# Patient Record
Sex: Female | Born: 1940 | Race: White | Hispanic: No | Marital: Married | Smoking: Never smoker
Health system: Southern US, Community
[De-identification: ages and names within clinical notes are randomized; demographics above are authoritative.]

## PROBLEM LIST (undated history)

## (undated) DIAGNOSIS — E785 Hyperlipidemia, unspecified: Secondary | ICD-10-CM

## (undated) DIAGNOSIS — H269 Unspecified cataract: Secondary | ICD-10-CM

## (undated) DIAGNOSIS — C4491 Basal cell carcinoma of skin, unspecified: Secondary | ICD-10-CM

## (undated) DIAGNOSIS — Z87442 Personal history of urinary calculi: Secondary | ICD-10-CM

## (undated) DIAGNOSIS — I10 Essential (primary) hypertension: Secondary | ICD-10-CM

## (undated) HISTORY — PX: COLONOSCOPY W/ BIOPSIES AND POLYPECTOMY: SHX1376

## (undated) HISTORY — DX: Personal history of urinary calculi: Z87.442

## (undated) HISTORY — PX: DILATION AND CURETTAGE OF UTERUS: SHX78

## (undated) HISTORY — DX: Unspecified cataract: H26.9

## (undated) HISTORY — DX: Essential (primary) hypertension: I10

## (undated) HISTORY — DX: Basal cell carcinoma of skin, unspecified: C44.91

## (undated) HISTORY — DX: Hyperlipidemia, unspecified: E78.5

## (undated) HISTORY — PX: MULTIPLE TOOTH EXTRACTIONS: SHX2053

## (undated) HISTORY — PX: PARATHYROIDECTOMY: SHX19

## (undated) HISTORY — PX: EYELID CARCINOMA EXCISION: SHX1563

## (undated) HISTORY — PX: TUBAL LIGATION: SHX77

---

## 1996-07-26 HISTORY — PX: ABDOMINAL HYSTERECTOMY: SHX81

## 1998-02-17 ENCOUNTER — Ambulatory Visit (HOSPITAL_COMMUNITY): Admission: RE | Admit: 1998-02-17 | Discharge: 1998-02-17 | Payer: Self-pay | Admitting: *Deleted

## 1998-09-29 ENCOUNTER — Ambulatory Visit (HOSPITAL_COMMUNITY): Admission: RE | Admit: 1998-09-29 | Discharge: 1998-09-29 | Payer: Self-pay | Admitting: Internal Medicine

## 1998-09-29 ENCOUNTER — Encounter: Payer: Self-pay | Admitting: Internal Medicine

## 1998-10-20 ENCOUNTER — Ambulatory Visit (HOSPITAL_COMMUNITY): Admission: RE | Admit: 1998-10-20 | Discharge: 1998-10-20 | Payer: Self-pay | Admitting: General Surgery

## 1998-10-20 ENCOUNTER — Encounter: Payer: Self-pay | Admitting: General Surgery

## 1999-01-12 ENCOUNTER — Other Ambulatory Visit: Admission: RE | Admit: 1999-01-12 | Discharge: 1999-01-12 | Payer: Self-pay | Admitting: *Deleted

## 1999-01-28 ENCOUNTER — Encounter: Payer: Self-pay | Admitting: *Deleted

## 1999-01-28 ENCOUNTER — Ambulatory Visit (HOSPITAL_COMMUNITY): Admission: RE | Admit: 1999-01-28 | Discharge: 1999-01-28 | Payer: Self-pay | Admitting: *Deleted

## 2000-01-29 ENCOUNTER — Ambulatory Visit (HOSPITAL_COMMUNITY): Admission: RE | Admit: 2000-01-29 | Discharge: 2000-01-29 | Payer: Self-pay | Admitting: *Deleted

## 2000-01-29 ENCOUNTER — Encounter: Payer: Self-pay | Admitting: *Deleted

## 2000-05-11 ENCOUNTER — Other Ambulatory Visit: Admission: RE | Admit: 2000-05-11 | Discharge: 2000-05-11 | Payer: Self-pay | Admitting: *Deleted

## 2000-06-02 ENCOUNTER — Encounter: Admission: RE | Admit: 2000-06-02 | Discharge: 2000-06-02 | Payer: Self-pay | Admitting: Specialist

## 2000-06-02 ENCOUNTER — Encounter: Payer: Self-pay | Admitting: Specialist

## 2001-01-30 ENCOUNTER — Ambulatory Visit (HOSPITAL_COMMUNITY): Admission: RE | Admit: 2001-01-30 | Discharge: 2001-01-30 | Payer: Self-pay | Admitting: *Deleted

## 2001-01-30 ENCOUNTER — Encounter: Payer: Self-pay | Admitting: *Deleted

## 2006-03-04 ENCOUNTER — Encounter: Admission: RE | Admit: 2006-03-04 | Discharge: 2006-03-04 | Payer: Self-pay | Admitting: Occupational Medicine

## 2007-05-29 ENCOUNTER — Ambulatory Visit: Payer: Self-pay | Admitting: Internal Medicine

## 2007-06-07 ENCOUNTER — Ambulatory Visit: Payer: Self-pay | Admitting: Internal Medicine

## 2010-09-30 ENCOUNTER — Encounter: Payer: Self-pay | Admitting: Family Medicine

## 2010-09-30 ENCOUNTER — Encounter (INDEPENDENT_AMBULATORY_CARE_PROVIDER_SITE_OTHER): Payer: MEDICARE | Admitting: Family Medicine

## 2010-09-30 DIAGNOSIS — E785 Hyperlipidemia, unspecified: Secondary | ICD-10-CM | POA: Insufficient documentation

## 2010-09-30 DIAGNOSIS — M899 Disorder of bone, unspecified: Secondary | ICD-10-CM

## 2010-09-30 DIAGNOSIS — I10 Essential (primary) hypertension: Secondary | ICD-10-CM | POA: Insufficient documentation

## 2010-10-06 NOTE — Assessment & Plan Note (Signed)
Summary: Medicare New Pt....   Vital Signs:  Patient profile:   70 year old female Height:      63 inches Weight:      156 pounds BMI:     27.73 Temp:     98.4 degrees F oral Pulse rate:   64 / minute Pulse rhythm:   regular BP sitting:   120 / 72  (left arm) Cuff size:   regular  Vitals Entered By: Linde Gillis CMA Duncan Dull) 10-29-10 10:46 AM) CC: new medicare physicial  Vision Screening:Both eyes w/o correction:  20/ 25 Left eye with correction: 20 / 25 Right eye with correction: 20 / 30  Color vision testing: normal      Vision Entered By: Linde Gillis CMA Duncan Dull) Oct 29, 2010 10:57 AM)  Hearing Screen  20db HL: Left  500 hz: 20db 1000 hz: 20db 2000 hz: 20db 4000 hz: 20db Right  500 hz: 20db 1000 hz: 20db 2000 hz: 20db 4000 hz: 20db   Hearing Testing Entered By: Linde Gillis CMA Duncan Dull) 2010/10/29 10:57 AM)   History of Present Illness: 70 yo here to establish care.  HTN- stable on current medications.  Has been on Triamtere/HCTZ 37/5/25 and Amlodipine 5 mg daily for years.  No CP, SOB, Le edema, blurred vision.  HLD- was told her lipid panel was elevated in June.  Lipitor caused muscle aches so she has been taking Red yeast rice which has not been causing myalgias.  Overall doing very well.  Stays active.  She is due for a DEXA scan.  Preventive Screening-Counseling & Management  Alcohol-Tobacco     Smoking Status: never  Caffeine-Diet-Exercise     Does Patient Exercise: yes      Drug Use:  no.    Current Medications (verified): 1)  Amlodipine Besylate 5 Mg Tabs (Amlodipine Besylate) .... Take One Tablet By Mouth Daily 2)  Triamterene-Hctz 37.5-25 Mg Tabs (Triamterene-Hctz) .... Take One Tablet By Mouth Daily 3)  Baby Aspirin 81 Mg Chew (Aspirin) .... Take One Tablet By Mouth Daily 4)  Lotrisone 1-0.05 % Crea (Clotrimazole-Betamethasone) .... Apply To Affected Area Two Times A Day 5)  Caltrate 600+d 600-400 Mg-Unit  Tabs (Calcium  Carbonate-Vitamin D) .... Take 1 Tablet By Mouth Two Times A Day  Allergies (verified): No Known Drug Allergies  Past History:  Family History: Last updated: 10/29/2010 Dad died of Colon CA at 36 yo Mom died of respiratory complications from PNA and TB at 69 yo  Social History: Last updated: 10/29/10 Married Never Smoked Alcohol use-no Drug use-no Regular exercise-yes  Risk Factors: Exercise: yes (10-29-2010)  Risk Factors: Smoking Status: never (2010/10/29)  Past Medical History: Hyperlipidemia Hypertension  Past Surgical History: Hysterectomy 1998- fibroids Parathyroidectomy 1990  Family History: Dad died of Colon CA at 52 yo Mom died of respiratory complications from PNA and TB at 69 yo  Social History: Married Never Smoked Alcohol use-no Drug use-no Regular exercise-yes Smoking Status:  never Drug Use:  no Does Patient Exercise:  yes  Review of Systems      See HPI General:  Denies malaise. Eyes:  Denies blurring. ENT:  Denies difficulty swallowing. CV:  Denies chest pain or discomfort. Resp:  Denies shortness of breath. GI:  Denies abdominal pain, bloody stools, and change in bowel habits. GU:  Denies dysuria. MS:  Denies joint pain, joint redness, and joint swelling. Derm:  Denies rash. Neuro:  Denies headaches. Psych:  Denies anxiety and depression.  Endo:  Denies cold intolerance and heat intolerance.  Physical Exam  General:  alert, well-developed, and well-nourished.   Head:  normocephalic and atraumatic.   Eyes:  vision grossly intact, pupils equal, pupils round, and pupils reactive to light.   Ears:  R ear normal and L ear normal.   Nose:  no external deformity.   Mouth:  good dentition.   Neck:  No deformities, masses, or tenderness noted. Lungs:  Normal respiratory effort, chest expands symmetrically. Lungs are clear to auscultation, no crackles or wheezes. Heart:  Normal rate and regular rhythm. S1 and S2 normal without gallop,  murmur, click, rub or other extra sounds. Abdomen:  Bowel sounds positive,abdomen soft and non-tender without masses, organomegaly or hernias noted. Msk:  No deformity or scoliosis noted of thoracic or lumbar spine.   Extremities:  no edema varicose veins bilaterally, nontender to palp Neurologic:  alert & oriented X3 and gait normal.   Skin:  Intact without suspicious lesions or rashes Psych:  Cognition and judgment appear intact. Alert and cooperative with normal attention span and concentration. No apparent delusions, illusions, hallucinations   Impression & Recommendations:  Problem # 1:  HYPERTENSION (ICD-401.9) Assessment Unchanged stable, continue current meds (awaiting records). Will check a BMET. Her updated medication list for this problem includes:    Amlodipine Besylate 5 Mg Tabs (Amlodipine besylate) .Marland Kitchen... Take one tablet by mouth daily    Triamterene-hctz 37.5-25 Mg Tabs (Triamterene-hctz) .Marland Kitchen... Take one tablet by mouth daily  Problem # 2:  HYPERLIPIDEMIA (ICD-272.4) Assessment: Unchanged Awaiting records, will order FLP, hepatic panel. Continue current dose of red yeast rice.  Problem # 3:  ? of OSTEOPENIA (ICD-733.90) Assessment: Unchanged Continue Caltrate, DEXA ordred today. Her updated medication list for this problem includes:    Caltrate 600+d 600-400 Mg-unit Tabs (Calcium carbonate-vitamin d) .Marland Kitchen... Take 1 tablet by mouth two times a day  Orders: Radiology Referral (Radiology)  Complete Medication List: 1)  Amlodipine Besylate 5 Mg Tabs (Amlodipine besylate) .... Take one tablet by mouth daily 2)  Triamterene-hctz 37.5-25 Mg Tabs (Triamterene-hctz) .... Take one tablet by mouth daily 3)  Baby Aspirin 81 Mg Chew (Aspirin) .... Take one tablet by mouth daily 4)  Lotrisone 1-0.05 % Crea (Clotrimazole-betamethasone) .... Apply to affected area two times a day 5)  Caltrate 600+d 600-400 Mg-unit Tabs (Calcium carbonate-vitamin d) .... Take 1 tablet by mouth two  times a day  Patient Instructions: 1)  Great to meet you. 2)  At your convenience, please make a fasting lab appointment- lipid panel, hepatic panel(272.4), BMET (401.9) 3)  Make an appointment in June for your medicare physical. 4)  Please stop by to see Shirlee Limerick on your way out to set up your bone density scan. Prescriptions: LOTRISONE 1-0.05 % CREA (CLOTRIMAZOLE-BETAMETHASONE) apply to affected area two times a day  #30 g x 1   Entered and Authorized by:   Ruthe Mannan MD   Signed by:   Ruthe Mannan MD on 09/30/2010   Method used:   Electronically to        [facility]  [address] (retail)       [address]       [place], [place]  [address]       Ph: [phone] or [phone]       Fax: [phone]   RxID:   [id number]    Orders Added: 1)  Radiology Referral [Radiology] 2)  New Patient Level III [56387]    Current Allergies (reviewed today): No  known allergies    Flex Sig Next Due:  Not Indicated Colonoscopy Result Date:  09/27/2006 Colonoscopy Result:  historical normal Hemoccult Result Date:  01/09/2010 Hemoccult Result:  historical normal PAP Next Due:  Not Indicated Mammogram Result Date:  05/08/2010 Mammogram Result:  historical-normal

## 2010-10-07 ENCOUNTER — Encounter (INDEPENDENT_AMBULATORY_CARE_PROVIDER_SITE_OTHER): Payer: Medicare Other

## 2010-10-07 ENCOUNTER — Encounter (INDEPENDENT_AMBULATORY_CARE_PROVIDER_SITE_OTHER): Payer: MEDICARE | Admitting: Vascular Surgery

## 2010-10-07 DIAGNOSIS — I83893 Varicose veins of bilateral lower extremities with other complications: Secondary | ICD-10-CM

## 2010-10-07 DIAGNOSIS — R609 Edema, unspecified: Secondary | ICD-10-CM

## 2010-10-08 NOTE — Consult Note (Signed)
NEW PATIENT CONSULTATION  Amy Barron, [name] DOB:  06-19-1941                                       10/07/2010 ZOXWR#:[id number]  Amy Barron presents today for evaluation of lower extremity pathology.  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  This is worse in her popliteal fossa and the thighs bilaterally.  She does have some of these scattered around her calves and also does have several isolated varicosities on her lateral calves bilaterally as well.  She does not have any history of DVT, superficial thrombophlebitis or bleeding.  PAST MEDICAL HISTORY:  Significant for hypertension and she does have history of prior hysterectomy and thyroidectomy.  SOCIAL HISTORY:  She is married with 2 children.  She does not smoke or drink alcohol.  REVIEW OF SYSTEMS:  Positive for weight loss and change in eyesight but has no change.  PHYSICAL EXAMINATION:  Well-developed, well-nourished white female appearing her stated age in no acute stress. Blood pressure is 122/76, pulse 72, respirations 16. HEENT:  Normal. Her radial and dorsalis pedis pulse are 2+ bilaterally. MUSCULOSKELETAL:  Shows no major deformity or cyanosis. NEUROLOGIC:  No focal weakness or paresthesias. SKIN:  Without ulcers or rashes.  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  Venous duplex was done in our office.  This reveals that her great saphenous vein __________ .  She does have some reticular branches of her left thigh but no evidence of significant valvular disease.  The deep veins showed no evidence of DVT and she has competent deep veins as well.  I discussed this at length with Ms.  Casimiro Needle and I explained that I do not see evidence of significant arterial or venous pathology that he would be dangerous.  I did explain the treatment options for the extensive telangiectasia that she has.  I explained that we will treat these with sclerotherapy.  Due to the extensive nature of these, I explained that it would require multiple sessions.  I explained the out of pocket expense related to this and she does have the contact information for Clementeen Hoof, RN for sclerotherapy in our office and I will see again at her convenience.    Larina Earthly, M.D. Electronically Signed  TFE/MEDQ  D:  10/07/2010  T:  10/08/2010  Job:  [id number]

## 2010-10-09 ENCOUNTER — Other Ambulatory Visit (INDEPENDENT_AMBULATORY_CARE_PROVIDER_SITE_OTHER): Payer: Medicare Other

## 2010-10-09 ENCOUNTER — Encounter (INDEPENDENT_AMBULATORY_CARE_PROVIDER_SITE_OTHER): Payer: Self-pay | Admitting: *Deleted

## 2010-10-09 ENCOUNTER — Other Ambulatory Visit: Payer: Self-pay | Admitting: Family Medicine

## 2010-10-09 DIAGNOSIS — I10 Essential (primary) hypertension: Secondary | ICD-10-CM

## 2010-10-09 DIAGNOSIS — E785 Hyperlipidemia, unspecified: Secondary | ICD-10-CM

## 2010-10-09 LAB — BASIC METABOLIC PANEL
BUN: 24 mg/dL — ABNORMAL HIGH (ref 6–23)
CO2: 28 mEq/L (ref 19–32)
Calcium: 9.5 mg/dL (ref 8.4–10.5)
Creatinine, Ser: 0.8 mg/dL (ref 0.4–1.2)
GFR: 71.57 mL/min (ref >60.00)
Glucose, Bld: 112 mg/dL — ABNORMAL HIGH (ref 70–99)

## 2010-10-09 LAB — HEPATIC FUNCTION PANEL
Albumin: 4.4 g/dL (ref 3.5–5.2)
Total Protein: 6.9 g/dL (ref 6.0–8.3)

## 2010-10-09 LAB — LIPID PANEL
Cholesterol: 234 mg/dL — ABNORMAL HIGH (ref 0–200)
HDL: 52 mg/dL (ref >39.00)
Total CHOL/HDL Ratio: 5
Triglycerides: 152 mg/dL — ABNORMAL HIGH (ref 0.0–149.0)
VLDL: 30.4 mg/dL (ref 0.0–40.0)

## 2010-10-09 LAB — LDL CHOLESTEROL, DIRECT: Direct LDL: 159.1 mg/dL

## 2010-10-13 NOTE — Procedures (Unsigned)
LOWER EXTREMITY VENOUS REFLUX EXAM  INDICATION:  Varicose veins with pain and swelling.  EXAM:  Using color-flow imaging and pulse Doppler spectral analysis, the bilateral common femoral, superficial femoral, popliteal, posterior tibial, greater and small saphenous veins are evaluated.  There is no evidence suggesting deep venous insufficiency in the bilateral lower extremities.  The right saphenofemoral junction is absent with previous surgical intervention.  The left saphenofemoral junction is competent.  The right GSV is competent.  The left GSV is competent at all segments except the knee level with the presence of a branch.  The bilateral proximal short saphenous vein demonstrates competency.  GSV Diameter (used if found to be incompetent only)                                           Right    Left Proximal Greater Saphenous Vein           cm       cm Proximal-to-mid-thigh                     cm       cm Mid thigh                                 cm       cm Mid-distal thigh                          cm       cm Distal thigh                              cm       cm Knee                                      cm       cm  IMPRESSION: 1. The right great saphenous vein is competent.  The left great     saphenous vein is incompetent at the knee segment with reflux >500     milliseconds and a measurement of 0.50 cm. 2. The right great saphenous vein is tortuous.  The left great     saphenous vein is not tortuous. 3. The deep venous system is competent. 4. The bilateral small saphenous vein is competent.  ___________________________________________ Larina Earthly, M.D.  SH/MEDQ  D:  10/07/2010  T:  10/07/2010  Job:  [id number]

## 2010-10-20 ENCOUNTER — Encounter: Payer: Self-pay | Admitting: Family Medicine

## 2010-11-09 ENCOUNTER — Encounter: Payer: Self-pay | Admitting: Family Medicine

## 2010-11-16 ENCOUNTER — Encounter: Payer: Self-pay | Admitting: Family Medicine

## 2010-11-18 ENCOUNTER — Encounter: Payer: Self-pay | Admitting: Family Medicine

## 2010-11-24 ENCOUNTER — Encounter: Payer: Self-pay | Admitting: Family Medicine

## 2010-11-25 ENCOUNTER — Encounter: Payer: Self-pay | Admitting: Family Medicine

## 2010-11-27 ENCOUNTER — Encounter: Payer: Self-pay | Admitting: Family Medicine

## 2011-01-25 ENCOUNTER — Other Ambulatory Visit: Payer: Self-pay | Admitting: Dermatology

## 2011-02-15 ENCOUNTER — Other Ambulatory Visit: Payer: Self-pay | Admitting: Family Medicine

## 2011-03-20 ENCOUNTER — Other Ambulatory Visit: Payer: Self-pay | Admitting: Family Medicine

## 2011-04-13 ENCOUNTER — Encounter: Payer: Self-pay | Admitting: *Deleted

## 2011-04-13 ENCOUNTER — Encounter: Payer: Self-pay | Admitting: Family Medicine

## 2011-04-16 ENCOUNTER — Other Ambulatory Visit (INDEPENDENT_AMBULATORY_CARE_PROVIDER_SITE_OTHER): Payer: Medicare Other

## 2011-04-16 ENCOUNTER — Other Ambulatory Visit: Payer: Self-pay | Admitting: Family Medicine

## 2011-04-16 DIAGNOSIS — I10 Essential (primary) hypertension: Secondary | ICD-10-CM

## 2011-04-16 DIAGNOSIS — E785 Hyperlipidemia, unspecified: Secondary | ICD-10-CM

## 2011-04-16 LAB — BASIC METABOLIC PANEL
CO2: 31 mEq/L (ref 19–32)
Chloride: 106 mEq/L (ref 96–112)
Creatinine, Ser: 0.7 mg/dL (ref 0.4–1.2)

## 2011-04-16 LAB — LIPID PANEL
LDL Cholesterol: 124 mg/dL — ABNORMAL HIGH (ref 0–99)
Total CHOL/HDL Ratio: 4
Triglycerides: 99 mg/dL (ref 0.0–149.0)

## 2011-04-20 ENCOUNTER — Other Ambulatory Visit: Payer: Self-pay | Admitting: Family Medicine

## 2011-04-20 ENCOUNTER — Ambulatory Visit (INDEPENDENT_AMBULATORY_CARE_PROVIDER_SITE_OTHER): Payer: Medicare Other | Admitting: Family Medicine

## 2011-04-20 ENCOUNTER — Encounter: Payer: Self-pay | Admitting: Family Medicine

## 2011-04-20 VITALS — BP 112/60 | HR 65 | Temp 98.2°F | Ht 64.0 in | Wt 161.5 lb

## 2011-04-20 DIAGNOSIS — Z23 Encounter for immunization: Secondary | ICD-10-CM

## 2011-04-20 DIAGNOSIS — I10 Essential (primary) hypertension: Secondary | ICD-10-CM

## 2011-04-20 DIAGNOSIS — E785 Hyperlipidemia, unspecified: Secondary | ICD-10-CM

## 2011-04-20 DIAGNOSIS — Z0000 Encounter for general adult medical examination without abnormal findings: Secondary | ICD-10-CM | POA: Insufficient documentation

## 2011-04-20 NOTE — Progress Notes (Signed)
I have personally reviewed the Medicare Annual Wellness questionnaire and have noted 1. The patient's medical and social history 2. Their use of alcohol, tobacco or illicit drugs 3. Their current medications and supplements 4. The patient's functional ability including ADL's, fall risks, home safety risks and hearing or visual             impairment. 5. Diet and physical activities 6. Evidence for depression or mood disorders   HTN- stable on current medications.  Has been on Triamtere/HCTZ 37/5/25 and Amlodipine 5 mg daily for years.  No CP, SOB, Le edema, blurred vision. Lab Results  Component Value Date   CREATININE 0.7 04/16/2011     HLD- Lipitor caused muscle aches so she has been taking Red yeast rice which has not been causing myalgias. Has also been exercising three times a week and cutting back on starches and fats. Lipids much improved! Lab Results  Component Value Date   CHOL 196 04/16/2011   CHOL 234* 10/09/2010   Lab Results  Component Value Date   HDL 52.20 04/16/2011   HDL 16.10 10/09/2010   Lab Results  Component Value Date   LDLCALC 124* 04/16/2011   Lab Results  Component Value Date   TRIG 99.0 04/16/2011   TRIG 152.0* 10/09/2010     Lab Results  Component Value Date   ALT 14 10/09/2010   AST 22 10/09/2010   ALKPHOS 68 10/09/2010   BILITOT 0.8 10/09/2010          Patient Active Problem List  Diagnoses  . HYPERLIPIDEMIA  . HYPERTENSION  . Routine general medical examination at a health care facility   Past Medical History  Diagnosis Date  . Hypertension   . Hyperlipidemia    Past Surgical History  Procedure Date  . Abdominal hysterectomy   . Parathyroidectomy    History  Substance Use Topics  . Smoking status: Never Smoker   . Smokeless tobacco: Not on file  . Alcohol Use: Not on file   Family History  Problem Relation Age of Onset  . Cancer Father    Allergies not on file Current Outpatient Prescriptions on File Prior to Visit    Medication Sig Dispense Refill  . amLODipine (NORVASC) 5 MG tablet TAKE ONE TABLET BY MOUTH DAILY AS DIRECTED  30 tablet  5  . etodolac (LODINE) 400 MG tablet TAKE 1 TABLET BY MOUTH ONCE A DAY AS DIRECTED  30 tablet  5   The PMH, PSH, Social History, Family History, Medications, and allergies have been reviewed in [facility], and have been updated if relevant.  Review of Systems       See HPI  Physical Exam BP 112/60  Pulse 65  Temp(Src) 98.2 F (36.8 C) (Oral)  Ht 5\' 4"  (1.626 m)  Wt 161 lb 8 oz (73.256 kg)  BMI 27.72 kg/m2  General:  alert, well-developed, and well-nourished.   Head:  normocephalic and atraumatic.   Eyes:  vision grossly intact, pupils equal, pupils round, and pupils reactive to light.   Ears:  R ear normal and L ear normal.   Nose:  no external deformity.   Mouth:  good dentition.   Neck:  No deformities, masses, or tenderness noted. Lungs:  Normal respiratory effort, chest expands symmetrically. Lungs are clear to auscultation, no crackles or wheezes. Heart:  Normal rate and regular rhythm. S1 and S2 normal without gallop, murmur, click, rub or other extra sounds. Abdomen:  Bowel sounds positive,abdomen soft and non-tender without masses,  organomegaly or hernias noted. Msk:  No deformity or scoliosis noted of thoracic or lumbar spine.   Extremities:  no edema varicose veins bilaterally, nontender to palp Neurologic:  alert & oriented X3 and gait normal.   Skin:  Intact without suspicious lesions or rashes Psych:  Cognition and judgment appear intact. Alert and cooperative with normal attention span and concentration. No apparent delusions, illusions, hallucinations  Assessment and Plan: 1. Routine general medical examination at a health care facility   The patients weight, height, BMI and visual acuity have been recorded in the chart I have made referrals, counseling and provided education to the patient based review of the above and I have provided the pt with  a written personalized care plan for preventive services. IFOB ordered today. Pneumovax given.  2. HYPERLIPIDEMIA   Improved with improved diet and exercise!   3. HYPERTENSION   Stable on current meds.

## 2011-04-20 NOTE — Patient Instructions (Signed)
Good to you. Have a wonderful holiday season. You can come back to get your flu shot or go to your local pharmacy.

## 2011-04-27 DIAGNOSIS — Z1211 Encounter for screening for malignant neoplasm of colon: Secondary | ICD-10-CM

## 2011-05-05 ENCOUNTER — Other Ambulatory Visit: Payer: Medicare Other

## 2011-05-05 ENCOUNTER — Encounter: Payer: Self-pay | Admitting: *Deleted

## 2011-05-05 ENCOUNTER — Other Ambulatory Visit: Payer: Self-pay | Admitting: Family Medicine

## 2011-08-17 ENCOUNTER — Other Ambulatory Visit: Payer: Self-pay | Admitting: *Deleted

## 2011-08-17 MED ORDER — AMLODIPINE BESYLATE 5 MG PO TABS: 5.0000 mg | ORAL_TABLET | Freq: Every day | ORAL | Status: DC

## 2011-09-20 ENCOUNTER — Other Ambulatory Visit: Payer: Self-pay | Admitting: Dermatology

## 2011-12-13 ENCOUNTER — Ambulatory Visit (INDEPENDENT_AMBULATORY_CARE_PROVIDER_SITE_OTHER)
Admission: RE | Admit: 2011-12-13 | Discharge: 2011-12-13 | Disposition: A | Discharge status: Home / Self Care | Payer: Medicare Other | Source: Ambulatory Visit | Attending: Family Medicine | Admitting: Family Medicine

## 2011-12-13 ENCOUNTER — Ambulatory Visit (INDEPENDENT_AMBULATORY_CARE_PROVIDER_SITE_OTHER): Payer: Medicare Other | Admitting: Family Medicine

## 2011-12-13 ENCOUNTER — Encounter: Payer: Self-pay | Admitting: Family Medicine

## 2011-12-13 VITALS — BP 110/80 | HR 76 | Temp 98.0°F | Wt 164.0 lb

## 2011-12-13 DIAGNOSIS — M545 Low back pain: Secondary | ICD-10-CM

## 2011-12-13 NOTE — Progress Notes (Signed)
SUBJECTIVE:  Amy Barron is a 71 y.o. female who complains of low back pain for 2 week(s), positional with bending or lifting, with radiation down the legs. Precipitating factors: none recalled by the patient. Prior history of back problems: recurrent self limited episodes of low back pain in the past. There is no numbness in the legs.  Patient Active Problem List  Diagnoses  . HYPERLIPIDEMIA  . HYPERTENSION  . Routine general medical examination at a health care facility  . Low back pain   Past Medical History  Diagnosis Date  . Hypertension   . Hyperlipidemia    Past Surgical History  Procedure Date  . Abdominal hysterectomy   . Parathyroidectomy    History  Substance Use Topics  . Smoking status: Never Smoker   . Smokeless tobacco: Not on file  . Alcohol Use: Not on file   Family History  Problem Relation Age of Onset  . Cancer Father    No Known Allergies Current Outpatient Prescriptions on File Prior to Visit  Medication Sig Dispense Refill  . amLODipine (NORVASC) 5 MG tablet Take 1 tablet (5 mg total) by mouth daily.  30 tablet  11  . aspirin 81 MG tablet Take 81 mg by mouth daily.        Marland Kitchen etodolac (LODINE) 400 MG tablet TAKE 1 TABLET BY MOUTH ONCE A DAY AS DIRECTED  30 tablet  5  . Red Yeast Rice 600 MG TABS Take 1 tablet by mouth 2 (two) times daily.        Marland Kitchen triamterene-hydrochlorothiazide (MAXZIDE-25) 37.5-25 MG per tablet TAKE 1 TABLET BY MOUTH ONCE A DAY  30 tablet  11   The PMH, PSH, Social History, Family History, Medications, and allergies have been reviewed in [facility], and have been updated if relevant.  OBJECTIVE: BP 110/80  Pulse 76  Temp(Src) 98 F (36.7 C) (Oral)  Wt 164 lb (74.39 kg)  Patient appears to be in mild to moderate pain, antalgic gait noted. Lumbosacral spine area reveals no local tenderness or mass.  Painful and reduced LS ROM noted. Straight leg raise is negative bilaterally DTR's, motor strength and sensation normal, including heel  and toe gait. Does have some tenderness of trochanteric bursa bilaterally. Peripheral pulses are palpable. X-Ray: ordered, but results not yet available.  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 Xray ordered given leg pain. For acute pain, rest, intermittent application of heat (do not sleep on heating pad), analgesics and muscle relaxants are recommended. Discussed longer term treatment plan of prn NSAID's and discussed a home back care exercise program with flexion exercise routine. Proper lifting with avoidance of heavy lifting discussed. Consider Physical Therapy and XRay studies if not improving. Call or return to clinic prn if these symptoms worsen or fail to improve as anticipated.

## 2011-12-13 NOTE — Patient Instructions (Signed)
Let's get an xray today. Aleve (1-2 tablets daily with food) and/or tylenol as needed for pain.  Subacromial injection may be beneficial to help with pain and to decrease inflammation.  Please don't take with your Lodine. Try applying heat and exercises.

## 2011-12-24 ENCOUNTER — Other Ambulatory Visit: Payer: Self-pay | Admitting: Family Medicine

## 2012-01-18 ENCOUNTER — Encounter (HOSPITAL_COMMUNITY): Payer: Self-pay | Admitting: Anesthesiology

## 2012-01-18 ENCOUNTER — Encounter (HOSPITAL_COMMUNITY)
Admission: AD | Disposition: A | Discharge status: Home / Self Care | Payer: Self-pay | Source: Ambulatory Visit | Attending: Orthopaedic Surgery

## 2012-01-18 ENCOUNTER — Other Ambulatory Visit (HOSPITAL_COMMUNITY): Payer: Self-pay | Admitting: Orthopaedic Surgery

## 2012-01-18 ENCOUNTER — Ambulatory Visit (HOSPITAL_COMMUNITY): Payer: Medicare Other

## 2012-01-18 ENCOUNTER — Ambulatory Visit (HOSPITAL_COMMUNITY): Payer: Medicare Other | Admitting: Anesthesiology

## 2012-01-18 ENCOUNTER — Emergency Department (HOSPITAL_COMMUNITY)
Admission: EM | Admit: 2012-01-18 | Discharge: 2012-01-18 | Disposition: A | Discharge status: Home / Self Care | Payer: Medicare Other | Source: Home / Self Care | Attending: Emergency Medicine | Admitting: Emergency Medicine

## 2012-01-18 ENCOUNTER — Encounter (HOSPITAL_COMMUNITY): Payer: Self-pay | Admitting: Emergency Medicine

## 2012-01-18 ENCOUNTER — Encounter (HOSPITAL_COMMUNITY): Payer: Self-pay | Admitting: Pharmacy Technician

## 2012-01-18 ENCOUNTER — Ambulatory Visit (HOSPITAL_COMMUNITY)
Admission: AD | Admit: 2012-01-18 | Discharge: 2012-01-19 | Disposition: A | Discharge status: Home / Self Care | Payer: Medicare Other | Source: Ambulatory Visit | Attending: Orthopaedic Surgery | Admitting: Orthopaedic Surgery

## 2012-01-18 ENCOUNTER — Encounter (HOSPITAL_COMMUNITY): Payer: Self-pay | Admitting: *Deleted

## 2012-01-18 ENCOUNTER — Emergency Department (HOSPITAL_COMMUNITY): Payer: Medicare Other

## 2012-01-18 DIAGNOSIS — S52502A Unspecified fracture of the lower end of left radius, initial encounter for closed fracture: Secondary | ICD-10-CM | POA: Diagnosis present

## 2012-01-18 DIAGNOSIS — S62109A Fracture of unspecified carpal bone, unspecified wrist, initial encounter for closed fracture: Secondary | ICD-10-CM

## 2012-01-18 DIAGNOSIS — E785 Hyperlipidemia, unspecified: Secondary | ICD-10-CM | POA: Insufficient documentation

## 2012-01-18 DIAGNOSIS — W07XXXA Fall from chair, initial encounter: Secondary | ICD-10-CM | POA: Insufficient documentation

## 2012-01-18 DIAGNOSIS — Y92009 Unspecified place in unspecified non-institutional (private) residence as the place of occurrence of the external cause: Secondary | ICD-10-CM | POA: Insufficient documentation

## 2012-01-18 DIAGNOSIS — S52509A Unspecified fracture of the lower end of unspecified radius, initial encounter for closed fracture: Secondary | ICD-10-CM

## 2012-01-18 DIAGNOSIS — I10 Essential (primary) hypertension: Secondary | ICD-10-CM | POA: Insufficient documentation

## 2012-01-18 DIAGNOSIS — S52599A Other fractures of lower end of unspecified radius, initial encounter for closed fracture: Secondary | ICD-10-CM | POA: Insufficient documentation

## 2012-01-18 HISTORY — PX: ORIF WRIST FRACTURE: SHX2133

## 2012-01-18 LAB — SURGICAL PCR SCREEN: MRSA, PCR: NEGATIVE

## 2012-01-18 LAB — URINALYSIS, ROUTINE W REFLEX MICROSCOPIC
Glucose, UA: NEGATIVE mg/dL
Hgb urine dipstick: NEGATIVE
Ketones, ur: 15 mg/dL — AB
Protein, ur: NEGATIVE mg/dL
Urobilinogen, UA: 0.2 mg/dL (ref 0.0–1.0)

## 2012-01-18 LAB — COMPREHENSIVE METABOLIC PANEL
ALT: 11 U/L (ref 0–35)
Alkaline Phosphatase: 76 U/L (ref 39–117)
BUN: 20 mg/dL (ref 6–23)
CO2: 26 mEq/L (ref 19–32)
Chloride: 101 mEq/L (ref 96–112)
GFR calc Af Amer: >90 mL/min (ref >90)
GFR calc non Af Amer: 86 mL/min — ABNORMAL LOW (ref >90)
Glucose, Bld: 120 mg/dL — ABNORMAL HIGH (ref 70–99)
Potassium: 4 mEq/L (ref 3.5–5.1)
Sodium: 139 mEq/L (ref 135–145)
Total Bilirubin: 0.4 mg/dL (ref 0.3–1.2)

## 2012-01-18 LAB — CBC
HCT: 40.7 % (ref 36.0–46.0)
Hemoglobin: 13.6 g/dL (ref 12.0–15.0)
MCHC: 33.4 g/dL (ref 30.0–36.0)
RBC: 4.35 MIL/uL (ref 3.87–5.11)

## 2012-01-18 LAB — PROTIME-INR: Prothrombin Time: 12.4 seconds (ref 11.6–15.2)

## 2012-01-18 LAB — APTT: aPTT: 33 seconds (ref 24–37)

## 2012-01-18 SURGERY — OPEN REDUCTION INTERNAL FIXATION (ORIF) WRIST FRACTURE
Anesthesia: General | Site: Arm Lower | Laterality: Left | Wound class: Clean

## 2012-01-18 MED ORDER — FENTANYL CITRATE 0.05 MG/ML IJ SOLN
INTRAMUSCULAR | Status: AC
  Filled 2012-01-18: qty 2

## 2012-01-18 MED ORDER — TRIAMTERENE-HCTZ 37.5-25 MG PO TABS
1.0000 | ORAL_TABLET | Freq: Every day | ORAL | Status: DC
  Administered 2012-01-19: 1 via ORAL
  Filled 2012-01-18: qty 1

## 2012-01-18 MED ORDER — 0.9 % SODIUM CHLORIDE (POUR BTL) OPTIME
TOPICAL | Status: DC | PRN
  Administered 2012-01-18: 1000 mL

## 2012-01-18 MED ORDER — ONDANSETRON HCL 4 MG/2ML IJ SOLN: 4.0000 mg | Freq: Once | INTRAMUSCULAR | Status: DC | PRN

## 2012-01-18 MED ORDER — CEFAZOLIN SODIUM 1-5 GM-% IV SOLN
INTRAVENOUS | Status: DC | PRN
  Administered 2012-01-18 (×2): 1 g via INTRAVENOUS

## 2012-01-18 MED ORDER — ASPIRIN 81 MG PO CHEW
81.0000 mg | CHEWABLE_TABLET | Freq: Every day | ORAL | Status: DC
  Administered 2012-01-19: 81 mg via ORAL
  Filled 2012-01-18: qty 1

## 2012-01-18 MED ORDER — AMLODIPINE BESYLATE 5 MG PO TABS
5.0000 mg | ORAL_TABLET | Freq: Every day | ORAL | Status: DC
  Administered 2012-01-19: 5 mg via ORAL
  Filled 2012-01-18: qty 1

## 2012-01-18 MED ORDER — CEFAZOLIN SODIUM 1-5 GM-% IV SOLN: 1.0000 g | INTRAVENOUS | Status: DC

## 2012-01-18 MED ORDER — FENTANYL CITRATE 0.05 MG/ML IJ SOLN
INTRAMUSCULAR | Status: DC | PRN
  Administered 2012-01-18: 50 ug via INTRAVENOUS
  Administered 2012-01-18: 75 ug via INTRAVENOUS

## 2012-01-18 MED ORDER — CEFAZOLIN SODIUM 1-5 GM-% IV SOLN
INTRAVENOUS | Status: AC
  Filled 2012-01-18: qty 50

## 2012-01-18 MED ORDER — MORPHINE SULFATE 2 MG/ML IJ SOLN
1.0000 mg | INTRAMUSCULAR | Status: DC | PRN
  Administered 2012-01-19: 1 mg via INTRAVENOUS
  Filled 2012-01-18 (×2): qty 1

## 2012-01-18 MED ORDER — MUPIROCIN 2 % EX OINT
TOPICAL_OINTMENT | CUTANEOUS | Status: AC
  Filled 2012-01-18: qty 22

## 2012-01-18 MED ORDER — HYDROCODONE-ACETAMINOPHEN 5-325 MG PO TABS: 2.0000 | ORAL_TABLET | ORAL | Status: AC | PRN

## 2012-01-18 MED ORDER — PROPOFOL 10 MG/ML IV BOLUS
INTRAVENOUS | Status: DC | PRN
  Administered 2012-01-18: 130 mg via INTRAVENOUS

## 2012-01-18 MED ORDER — MIDAZOLAM HCL 5 MG/5ML IJ SOLN
INTRAMUSCULAR | Status: DC | PRN
  Administered 2012-01-18: 2 mg via INTRAVENOUS

## 2012-01-18 MED ORDER — OXYCODONE-ACETAMINOPHEN 5-325 MG PO TABS
1.0000 | ORAL_TABLET | ORAL | Status: DC | PRN
  Administered 2012-01-19: 2 via ORAL
  Filled 2012-01-18: qty 2

## 2012-01-18 MED ORDER — RED YEAST RICE 600 MG PO TABS: 1.0000 | ORAL_TABLET | Freq: Every day | ORAL | Status: DC

## 2012-01-18 MED ORDER — OXYCODONE-ACETAMINOPHEN 5-325 MG PO TABS
2.0000 | ORAL_TABLET | ORAL | Status: DC | PRN
  Administered 2012-01-18 (×2): 1 via ORAL

## 2012-01-18 MED ORDER — OXYCODONE-ACETAMINOPHEN 5-325 MG PO TABS
ORAL_TABLET | ORAL | Status: AC
  Filled 2012-01-18: qty 1

## 2012-01-18 MED ORDER — HYDROMORPHONE HCL PF 1 MG/ML IJ SOLN: 0.2500 mg | INTRAMUSCULAR | Status: DC | PRN

## 2012-01-18 MED ORDER — OXYCODONE-ACETAMINOPHEN 5-325 MG PO TABS
ORAL_TABLET | ORAL | Status: AC
  Administered 2012-01-18: 1 via ORAL
  Filled 2012-01-18: qty 1

## 2012-01-18 MED ORDER — HYDROCODONE-ACETAMINOPHEN 5-325 MG PO TABS: 2.0000 | ORAL_TABLET | ORAL | Status: DC | PRN

## 2012-01-18 MED ORDER — ONDANSETRON HCL 4 MG/2ML IJ SOLN
INTRAMUSCULAR | Status: DC | PRN
  Administered 2012-01-18: 4 mg via INTRAVENOUS

## 2012-01-18 MED ORDER — OMEGA-3-ACID ETHYL ESTERS 1 G PO CAPS
1.0000 g | ORAL_CAPSULE | Freq: Every day | ORAL | Status: DC
  Administered 2012-01-19: 1 g via ORAL
  Filled 2012-01-18: qty 1

## 2012-01-18 MED ORDER — LACTATED RINGERS IV SOLN
INTRAVENOUS | Status: DC | PRN
  Administered 2012-01-18: 20:00:00 via INTRAVENOUS

## 2012-01-18 MED ORDER — MIDAZOLAM HCL 2 MG/2ML IJ SOLN
INTRAMUSCULAR | Status: AC
  Filled 2012-01-18: qty 2

## 2012-01-18 MED ORDER — ETODOLAC 400 MG PO TABS
400.0000 mg | ORAL_TABLET | Freq: Every day | ORAL | Status: DC | PRN
  Filled 2012-01-18: qty 1

## 2012-01-18 MED ORDER — MUPIROCIN 2 % EX OINT
TOPICAL_OINTMENT | Freq: Two times a day (BID) | CUTANEOUS | Status: DC
  Administered 2012-01-18: 15:00:00 via NASAL

## 2012-01-18 SURGICAL SUPPLY — 64 items
BANDAGE ELASTIC 3 VELCRO ST LF (GAUZE/BANDAGES/DRESSINGS) ×2 IMPLANT
BANDAGE ELASTIC 4 VELCRO ST LF (GAUZE/BANDAGES/DRESSINGS) ×2 IMPLANT
BANDAGE GAUZE ELAST BULKY 4 IN (GAUZE/BANDAGES/DRESSINGS) ×2 IMPLANT
BENZOIN TINCTURE PRP APPL 2/3 (GAUZE/BANDAGES/DRESSINGS) ×2 IMPLANT
BIT DRILL 2 FAST STEP (BIT) ×2 IMPLANT
BIT DRILL 2.5X4 QC (BIT) ×2 IMPLANT
BNDG ESMARK 4X9 LF (GAUZE/BANDAGES/DRESSINGS) ×2 IMPLANT
CLOTH BEACON ORANGE TIMEOUT ST (SAFETY) ×2 IMPLANT
CLSR STERI-STRIP ANTIMIC 1/2X4 (GAUZE/BANDAGES/DRESSINGS) ×2 IMPLANT
CORDS BIPOLAR (ELECTRODE) IMPLANT
COVER SURGICAL LIGHT HANDLE (MISCELLANEOUS) ×2 IMPLANT
CUFF TOURNIQUET SINGLE 18IN (TOURNIQUET CUFF) ×2 IMPLANT
CUFF TOURNIQUET SINGLE 24IN (TOURNIQUET CUFF) IMPLANT
DRAPE OEC MINIVIEW 54X84 (DRAPES) IMPLANT
DRAPE SURG 17X23 STRL (DRAPES) ×2 IMPLANT
DURAPREP 26ML APPLICATOR (WOUND CARE) ×2 IMPLANT
ELECT REM PT RETURN 9FT ADLT (ELECTROSURGICAL)
ELECTRODE REM PT RTRN 9FT ADLT (ELECTROSURGICAL) IMPLANT
GAUZE XEROFORM 1X8 LF (GAUZE/BANDAGES/DRESSINGS) ×2 IMPLANT
GLOVE BIO SURGEON STRL SZ7 (GLOVE) ×4 IMPLANT
GLOVE BIOGEL PI IND STRL 7.0 (GLOVE) ×1 IMPLANT
GLOVE BIOGEL PI IND STRL 8 (GLOVE) ×1 IMPLANT
GLOVE BIOGEL PI INDICATOR 7.0 (GLOVE) ×1
GLOVE BIOGEL PI INDICATOR 8 (GLOVE) ×1
GLOVE ORTHO TXT STRL SZ7.5 (GLOVE) ×2 IMPLANT
GOWN SRG XL XLNG 56XLVL 4 (GOWN DISPOSABLE) ×1 IMPLANT
GOWN STRL NON-REIN LRG LVL3 (GOWN DISPOSABLE) ×2 IMPLANT
GOWN STRL NON-REIN XL XLG LVL4 (GOWN DISPOSABLE) ×1
K-WIRE 1.6 (WIRE) ×1
K-WIRE FX5X1.6XNS BN SS (WIRE) ×1
KIT BASIN OR (CUSTOM PROCEDURE TRAY) ×2 IMPLANT
KIT ROOM TURNOVER OR (KITS) ×2 IMPLANT
KWIRE FX5X1.6XNS BN SS (WIRE) ×1 IMPLANT
MANIFOLD NEPTUNE II (INSTRUMENTS) ×2 IMPLANT
NEEDLE HYPO 25GX1X1/2 BEV (NEEDLE) IMPLANT
NEEDLE HYPO 25X1 1.5 SAFETY (NEEDLE) IMPLANT
NS IRRIG 1000ML POUR BTL (IV SOLUTION) ×2 IMPLANT
PACK ORTHO EXTREMITY (CUSTOM PROCEDURE TRAY) ×2 IMPLANT
PAD ARMBOARD 7.5X6 YLW CONV (MISCELLANEOUS) ×4 IMPLANT
PAD CAST 3X4 CTTN HI CHSV (CAST SUPPLIES) ×1 IMPLANT
PAD CAST 4YDX4 CTTN HI CHSV (CAST SUPPLIES) ×1 IMPLANT
PADDING CAST COTTON 3X4 STRL (CAST SUPPLIES) ×1
PADDING CAST COTTON 4X4 STRL (CAST SUPPLIES) ×1
PEG SUBCHONDRAL SMOOTH 2.0X16 (Peg) ×4 IMPLANT
PEG SUBCHONDRAL SMOOTH 2.0X18 (Peg) ×10 IMPLANT
PEG THREADED 2.5MMX20MM LONG (Peg) ×2 IMPLANT
PENCIL BUTTON HOLSTER BLD 10FT (ELECTRODE) IMPLANT
PLATE SHORT 21.6X48.9 NRRW LT (Plate) ×2 IMPLANT
SCREW BN 12X3.5XNS CORT TI (Screw) ×1 IMPLANT
SCREW CORT 3.5X12 (Screw) ×1 IMPLANT
SPLINT FIBERGLASS 4X30 (CAST SUPPLIES) ×2 IMPLANT
SPONGE GAUZE 4X4 12PLY (GAUZE/BANDAGES/DRESSINGS) ×2 IMPLANT
SPONGE LAP 4X18 X RAY DECT (DISPOSABLE) ×4 IMPLANT
SPONGE SCRUB IODOPHOR (GAUZE/BANDAGES/DRESSINGS) ×2 IMPLANT
SUCTION FRAZIER TIP 10 FR DISP (SUCTIONS) IMPLANT
SUT PROLENE 3 0 PS 2 (SUTURE) IMPLANT
SUT VIC AB 3-0 FS2 27 (SUTURE) IMPLANT
SUT VICRYL 4-0 PS2 18IN ABS (SUTURE) IMPLANT
SYR CONTROL 10ML LL (SYRINGE) IMPLANT
TOWEL OR 17X24 6PK STRL BLUE (TOWEL DISPOSABLE) ×2 IMPLANT
TOWEL OR 17X26 10 PK STRL BLUE (TOWEL DISPOSABLE) ×2 IMPLANT
TUBE CONNECTING 12X1/4 (SUCTIONS) IMPLANT
UNDERPAD 30X30 INCONTINENT (UNDERPADS AND DIAPERS) ×2 IMPLANT
WATER STERILE IRR 1000ML POUR (IV SOLUTION) ×2 IMPLANT

## 2012-01-18 NOTE — Progress Notes (Signed)
Report to Lucille Passy RN as primary caregiver.  Husband at bedside.

## 2012-01-18 NOTE — Discharge Instructions (Signed)
Cast or Splint Care Casts and splints support injured limbs and keep bones from moving while they heal.  HOME CARE  Keep the cast or splint uncovered during the drying period.   A plaster cast can take 24 to 48 hours to dry.   A fiberglass cast will dry in less than 1 hour.   Do not rest the cast on anything harder than a pillow for 24 hours.   Do not put weight on your injured limb. Do not put pressure on the cast. Wait for your doctor's approval.   Keep the cast or splint dry.   Cover the cast or splint with a plastic bag during baths or wet weather.   If you have a cast over your chest and belly (trunk), take sponge baths until the cast is taken off.   Keep your cast or splint clean. Wash a dirty cast with a damp cloth.   Do not put any objects under your cast or splint. Do not scratch the skin under the cast with an object.   Do not take out the padding from inside your cast.   Exercise your joints near the cast as told by your doctor.   Raise (elevate) your injured limb on 1 or 2 pillows for the first 1 to 3 days.  GET HELP RIGHT AWAY IF:  Your cast or splint cracks.   Your cast or splint is too tight or too loose.   You itch badly under the cast.   Your cast gets wet or has a soft spot.   You have a bad smell coming from the cast.   You get an object stuck under the cast.   Your skin around the cast becomes red or raw.   You have new or more pain after the cast is put on.   You have fluid leaking through the cast.   You cannot move your fingers or toes.   Your fingers or toes turn colors or are cool, painful, or puffy (swollen).   You have tingling or lose feeling (numbness) around the injured area.   You have pain or pressure under the cast.   You have trouble breathing or have shortness of breath.   You have chest pain.  MAKE SURE YOU:  Understand these instructions.   Will watch your condition.   Will get help right away if you are not doing  well or get worse.  Document Released: 11/11/2010 Document Revised: 07/01/2011 Document Reviewed: 11/11/2010 [facility] Patient Information 2012 [place], [place].Wrist Fracture Your caregiver has diagnosed you as having a fracture of the wrist. A fracture is a break in the bone or bones. A cast or splint is used to protect and keep your injured bone(s) from moving. The cast or splint will usually be on for about 5 to 6 weeks. One of the bones of the wrist (the navicular bone) often does not show up as a fracture on X-ray until later or in the healing phase. With this bone your caregiver will often cast as though it is fractured even if not seen on the X-ray. HOME CARE INSTRUCTIONS   To lessen the swelling, keep the injured part elevated while sitting or lying down. Keeping the injury above the level of your heart (the center of the chest) will decrease swelling and pain.   Do not wear rings or jewelry on the injured hand or wrist.   Apply ice to the injury for 15 to 20 minutes, 3 to 4 times  per day while awake for 2 days. Put the ice in a plastic bag and place a thin towel between the bag of ice and your cast.   If you have a plaster or fiberglass cast:   Do not try to scratch the skin under the cast using sharp or pointed objects.   Check the skin around the cast every day. You may put lotion on any red or sore areas.   Keep your cast dry and clean.   If you have a plaster splint:   Wear the splint as directed.   You may loosen the elastic around the splint if your fingers become numb, tingle, or turn cold or blue.   If you have been put in a removable splint, wear and use as directed.   Do not use powders or deodorants in or around the cast or splint.   Do not remove padding from your cast or splint.   Do not put pressure on any part of your cast or splint. It may break. Rest your cast or splint only on a pillow the first 24 hours until it is fully hardened.   Gently move your  fingers often, so they do not get stiff.   Do not remove the splint unless directed by your caregiver. Casts must be removed by an orthopedist.   Your cast or splint can be protected during bathing with a plastic bag. Do not lower the cast or splint into water.   Only take over-the-counter or prescription medicines for pain, discomfort, or fever as directed by your caregiver.   Follow up with your caregiver as directed.  SEEK IMMEDIATE MEDICAL CARE IF:   Your cast or splint gets damaged or breaks.   Your cast or splint feels too tight or loose.   You have increased pain, not controlled with medication.   You have increased swelling.   Your skin or nails below the injury turn blue or grey or feel cold or numb.   You have trouble moving or feeling your fingers.   You experience any burning or stinging from the cast or splint.   There is a bad smell coming from under the cast or splint.   New stains or fluids are coming from under the cast or splint.   You have any new injuries while wearing the cast or splint.  Document Released: 04/21/2005 Document Revised: 07/01/2011 Document Reviewed: 02/08/2007 [facility] Patient Information 2012 [place], [place].

## 2012-01-18 NOTE — Interval H&P Note (Signed)
History and Physical Interval Note:  01/18/2012 7:31 PM  Amy Barron  has presented today for surgery, with the diagnosis of distal raduis fracture  The various methods of treatment have been discussed with the patient and family. After consideration of risks, benefits and other options for treatment, the patient has consented to  Procedure(s) (LRB): OPEN REDUCTION INTERNAL FIXATION (ORIF) WRIST FRACTURE (Left) as a surgical intervention .  The patient's history has been reviewed, patient examined, no change in status, stable for surgery.  I have reviewed the patients' chart and labs.  Questions were answered to the patient's satisfaction.     Aureliano Oshields C

## 2012-01-18 NOTE — Preoperative (Signed)
Beta Blockers   Reason not to administer Beta Blockers:Not Applicable 

## 2012-01-18 NOTE — Transfer of Care (Signed)
Immediate Anesthesia Transfer of Care Note  Patient: Amy Barron  Procedure(s) Performed: Procedure(s) (LRB): OPEN REDUCTION INTERNAL FIXATION (ORIF) WRIST FRACTURE (Left)  Patient Location: PACU  Anesthesia Type: GA combined with regional for post-op pain  Level of Consciousness: awake, alert  and oriented  Airway & Oxygen Therapy: Patient connected to nasal cannula oxygen  Post-op Assessment: Report given to PACU RN, Post -op Vital signs reviewed and stable and Patient moving all extremities X 4  Post vital signs: Reviewed and stable  Complications: No apparent anesthesia complications

## 2012-01-18 NOTE — Progress Notes (Addendum)
PHARMACIST - PHYSICIAN ORDER COMMUNICATION  CONCERNING: P&T Medication Policy on Herbal Medications  DESCRIPTION:  This patient's order for:  RED YEAST RICE  has been noted.  This product(s) is classified as an "herbal" or natural product. Due to a lack of definitive safety studies or FDA approval, nonstandard manufacturing practices, plus the potential risk of unknown drug-drug interactions while on inpatient medications, the Pharmacy and Therapeutics Committee does not permit the use of "herbal" or natural products of this type within [facility].   ACTION TAKEN: The pharmacy department is unable to verify this order at this time and your patient has been informed of this safety policy. Please reevaluate patient's clinical condition at discharge and address if the herbal or natural product(s) should be resumed at that time.

## 2012-01-18 NOTE — Progress Notes (Signed)
Orthopedic Tech Progress Note Patient Details:  Amy Barron [birth year] [id number]  Patient ID: Amy Barron, female   DOB: 12/27/1940, 71 y.o.   MRN: [id number]   Amy Barron 01/18/2012, 12:02 PM Wrist splint

## 2012-01-18 NOTE — Progress Notes (Signed)
Patient already has Rx from office 6/25 for percocet and has gotten it filled for post op pain after discharge in AM

## 2012-01-18 NOTE — H&P (Signed)
Amy Barron is an 71 y.o. female.   Chief Complaint: left distal radius fracture.  HPI: 71 yo female fell while cleaning window standing on a chair.  Sent from ER Dr Radford Pax to see me for surgical treatment.   Left calf contusion.  Meds amlodipine 5mg  po daily, ASA 81 mg po daily, triampterene/HCTZ 37.5/25mg  po daily  And red yeast  Past Medical History  Diagnosis Date  . Hypertension   . Hyperlipidemia     Past Surgical History  Procedure Date  . Abdominal hysterectomy   . Parathyroidectomy     Family History  Problem Relation Age of Onset  . Cancer Father    Social History:  reports that she has never smoked. She does not have any smokeless tobacco history on file. She reports that she does not drink alcohol or use illicit drugs.  Allergies: No Known Allergies  Medications Prior to Admission  Medication Sig Dispense Refill  . amLODipine (NORVASC) 5 MG tablet Take 5 mg by mouth daily.      Marland Kitchen aspirin 81 MG tablet Take 81 mg by mouth daily.        Marland Kitchen etodolac (LODINE) 400 MG tablet Take 400 mg by mouth daily as needed. For arthritis      . HYDROcodone-acetaminophen (NORCO) 5-325 MG per tablet Take 2 tablets by mouth every 4 (four) hours as needed for pain.  6 tablet  0  . Red Yeast Rice 600 MG TABS Take 1 tablet by mouth daily.       Marland Kitchen triamterene-hydrochlorothiazide (MAXZIDE-25) 37.5-25 MG per tablet Take 1 tablet by mouth daily.        No results found for this or any previous visit (from the past 48 hour(s)). Dg Wrist Complete Left  01/18/2012  *RADIOLOGY REPORT*  Clinical Data: Fall, wrist pain/swelling  LEFT WRIST - COMPLETE 3+ VIEW  Comparison: None.  Findings: Comminuted distal radial fracture with apex volar angulation.  Intra-articular extension is suspected.  Mildly displaced ulnar styloid fracture.  Associated moderate soft tissue swelling.  IMPRESSION: Distal radial and ulnar fractures, as described above.  Original Report Authenticated By: Charline Bills, M.D.      Review of Systems  Constitutional: Negative.   HENT: Negative.        Thyroid condition  Eyes: Negative.   Respiratory: Negative.   Cardiovascular: Negative.   Gastrointestinal: Negative.   Genitourinary: Negative.   Musculoskeletal: Negative.   Skin: Negative.   Neurological: Negative.   Psychiatric/Behavioral: Negative.     There were no vitals taken for this visit. Physical Exam  Constitutional: She is oriented to person, place, and time. She appears well-developed and well-nourished.  HENT:  Head: Normocephalic and atraumatic.  Eyes: Conjunctivae are normal. Pupils are equal, round, and reactive to light.  Neck: Normal range of motion.  Cardiovascular: Normal rate, regular rhythm, normal heart sounds and intact distal pulses.   Respiratory: Effort normal and breath sounds normal.  GI: Soft. Bowel sounds are normal.  Musculoskeletal:       Left distal radius deformity.  Intact 2 point all UE digits. Pulses normal  Neurological: She is alert and oriented to person, place, and time. She has normal reflexes.  Skin: Skin is warm and dry.  Psychiatric: She has a normal mood and affect. Her behavior is normal. Thought content normal.     Assessment/Plan Left comminuted, displaced intra-articular fracture with involvment of of distal RU joint and wrist joint with angulation.   Procedure discussed, risks of bleeding  infection reoperation, nerve or artery damage, all questions answered . She requests we proceed.   NPO since 6:30 AM  Earnest Thalman C 01/18/2012, 2:01 PM

## 2012-01-18 NOTE — Anesthesia Preprocedure Evaluation (Addendum)
Anesthesia Evaluation  Patient identified by MRN, date of birth, ID band Patient awake    Reviewed: Allergy & Precautions, H&P , NPO status   History of Anesthesia Complications Negative for: history of anesthetic complications  Airway Mallampati: II      Dental  (+) Partial Upper   Pulmonary neg pulmonary ROS,  breath sounds clear to auscultation        Cardiovascular Exercise Tolerance: Good hypertension, Pt. on medications Rhythm:Regular Rate:Normal     Neuro/Psych negative neurological ROS  negative psych ROS   GI/Hepatic   Endo/Other  S/p parathyroidectomy  Renal/GU      Musculoskeletal   Abdominal   Peds  Hematology   Anesthesia Other Findings   Reproductive/Obstetrics                          Anesthesia Physical Anesthesia Plan  ASA: II  Anesthesia Plan: General   Post-op Pain Management:    Induction: Intravenous  Airway Management Planned: LMA  Additional Equipment:   Intra-op Plan:   Post-operative Plan:   Informed Consent: I have reviewed the patients History and Physical, chart, labs and discussed the procedure including the risks, benefits and alternatives for the proposed anesthesia with the patient or authorized representative who has indicated his/her understanding and acceptance.   Dental advisory given  Plan Discussed with: CRNA and Anesthesiologist  Anesthesia Plan Comments: (Comminuted L. Distal radius fracture htn   Plan GA with supraclavicular block  Kipp Brood, MD )       Anesthesia Quick Evaluation

## 2012-01-18 NOTE — Anesthesia Procedure Notes (Addendum)
Procedure Name: LMA Insertion Date/Time: 01/18/2012 8:23 PM Performed by: Molli Hazard Pre-anesthesia Checklist: Patient identified, Emergency Drugs available, Suction available and Patient being monitored Patient Re-evaluated:Patient Re-evaluated prior to inductionOxygen Delivery Method: Circle system utilized Preoxygenation: Pre-oxygenation with 100% oxygen Intubation Type: IV induction Ventilation: Mask ventilation without difficulty LMA: LMA inserted LMA Size: 4.0 Number of attempts: 1 Placement Confirmation: positive ETCO2 and breath sounds checked- equal and bilateral Dental Injury: Teeth and Oropharynx as per pre-operative assessment    Anesthesia Regional Block:    Pre-Anesthetic Checklist: ,, timeout performed, Correct Patient, Correct Site, Correct Laterality, Correct Procedure, Correct Position, site marked, Risks and benefits discussed,  Surgical consent,  Pre-op evaluation,  At surgeon's request and post-op pain management  Laterality: Left  Prep: chloraprep       Needles:  Injection technique: Single-shot  Needle Type: Echogenic Stimulator Needle     Needle Length:cm 9 cm Needle Gauge: 22 and 22 G    Additional Needles:  Procedures: ultrasound guided and nerve stimulator  Narrative:  Start time: 01/18/2012 7:30 PM End time: 01/18/2012 7:40 PM  Performed by: Personally   Additional Notes: 25 cc 0.5% Marcaine with 1;200 Epi Kipp Brood, MD

## 2012-01-18 NOTE — ED Notes (Signed)
Pt. Stated, i fell off a chair when I was cleaning windows and feel with my lt. Wrist.

## 2012-01-18 NOTE — ED Provider Notes (Signed)
History   This chart was scribed for Amy Shi, MD by Amy Barron. The patient was seen in room TR05C/TR05C and the patient's care was started at 11:09 AM     CSN: [id number]  Arrival date & time 01/18/12  1040   None     Chief Complaint  Patient presents with  . Wrist Pain    (Consider location/radiation/quality/duration/timing/severity/associated sxs/prior treatment) Patient is a 71 y.o. female presenting with wrist pain. The history is provided by the patient. No language interpreter was used.  Wrist Pain This is a new problem. The current episode started 3 to 5 hours ago. The problem occurs constantly. The problem has not changed since onset.Pertinent negatives include no chest pain and no headaches. The symptoms are aggravated by bending and twisting. The symptoms are relieved by ice. She has tried a cold compress for the symptoms. The treatment provided moderate relief.   Amy Barron is a 71 y.o. female who presents to the Emergency Department complaining of moderate, episodic wrist pain located at the left wrist onset today. The pt was cleaning her windows when she fell and landed on her left wrist. Modifying factors include application of ice which provides moderate relief. Pt has a hx of hypertension.   Pt denies any other injuries.   PCP is Dr. Dayton Martes.     Past Medical History  Diagnosis Date  . Hypertension   . Hyperlipidemia     Past Surgical History  Procedure Date  . Abdominal hysterectomy   . Parathyroidectomy   . Orif wrist fracture 01/18/2012    Procedure: OPEN REDUCTION INTERNAL FIXATION (ORIF) WRIST FRACTURE;  Surgeon: Eldred Manges, MD;  Location: [facility] OR;  Service: Orthopedics;  Laterality: Left;    Family History  Problem Relation Age of Onset  . Cancer Father     History  Substance Use Topics  . Smoking status: Never Smoker   . Smokeless tobacco: Not on file  . Alcohol Use: No    OB History    Grav Para Term Preterm Abortions TAB SAB  Ect Mult Living                  Review of Systems  Cardiovascular: Negative for chest pain.  Neurological: Negative for headaches.  All other systems reviewed and are negative.    Allergies  Review of patient's allergies indicates no known allergies.  Home Medications   Current Outpatient Rx  Name Route Sig Dispense Refill  . AMLODIPINE BESYLATE 5 MG PO TABS Oral Take 5 mg by mouth daily.    . ASPIRIN 81 MG PO TABS Oral Take 81 mg by mouth daily.      . ETODOLAC 400 MG PO TABS Oral Take 400 mg by mouth daily as needed. For arthritis    . RED YEAST RICE 600 MG PO TABS Oral Take 1 tablet by mouth daily.     . TRIAMTERENE-HCTZ 37.5-25 MG PO TABS Oral Take 1 tablet by mouth daily.    Marland Kitchen CLOTRIMAZOLE-BETAMETHASONE 1-0.05 % EX CREA  APPLY TO AFFECTED AREA TWICE DAILY 30 g 0  . NAPROXEN SODIUM 220 MG PO TABS Oral Take 220 mg by mouth daily as needed.    Marland Kitchen FISH OIL PO Oral Take 1 capsule by mouth once a week.      BP 160/68  Pulse 72  Temp 98.3 F (36.8 C) (Oral)  Resp 16  SpO2 100%  Physical Exam  Nursing note and vitals reviewed. Constitutional: She is  oriented to person, place, and time. She appears well-developed and well-nourished. No distress.  HENT:  Head: Normocephalic and atraumatic.  Eyes: Pupils are equal, round, and reactive to light.  Neck: Normal range of motion.  Cardiovascular: Normal rate and intact distal pulses.   Pulmonary/Chest: No respiratory distress.  Abdominal: Normal appearance. She exhibits no distension.  Musculoskeletal:        Obvious deformity, neurovascularly intact, decreased ROM, bony tenderness to palpitation.   Neurological: She is alert and oriented to person, place, and time. No cranial nerve deficit.  Skin: Skin is warm and dry. No rash noted.  Psychiatric: She has a normal mood and affect. Her behavior is normal.    ED Course  Procedures (including critical care time) Discussed with Dr. Ophelia Charter DIAGNOSTIC STUDIES: Oxygen  Saturation is 100% on room air, normal by my interpretation.    COORDINATION OF CARE:  11:10AM    Labs Reviewed - No data to display Dg Wrist Complete Left  01/18/2012  *RADIOLOGY REPORT*  Clinical Data: Fall, wrist pain/swelling  LEFT WRIST - COMPLETE 3+ VIEW  Comparison: None.  Findings: Comminuted distal radial fracture with apex volar angulation.  Intra-articular extension is suspected.  Mildly displaced ulnar styloid fracture.  Associated moderate soft tissue swelling.  IMPRESSION: Distal radial and ulnar fractures, as described above.  Original Report Authenticated By: Charline Bills, M.D.      1. Wrist fracture       MDM         I personally performed the services described in this documentation, which was scribed in my presence. The recorded information has been reviewed and considered.    Amy Shi, MD 02/06/12 [phone]

## 2012-01-18 NOTE — Brief Op Note (Signed)
01/18/2012  9:32 PM  PATIENT:  Amy Barron  71 y.o. female  PRE-OPERATIVE DIAGNOSIS:  distal raduis fracture left arm  POST-OPERATIVE DIAGNOSIS:  distal raduis fracture left arm  PROCEDURE:  Procedure(s) (LRB): OPEN REDUCTION INTERNAL FIXATION (ORIF) WRIST FRACTURE (Left)  SURGEON:  Surgeon(s) and Role:    * Eldred Manges, MD - Primary  PHYSICIAN ASSISTANT:   ASSISTANTS: none   ANESTHESIA:   regional and general  EBL:  Total I/O In: 700 [I.V.:700] Out: -   BLOOD ADMINISTERED:none  DRAINS: none   LOCAL MEDICATIONS USED:  NONE  SPECIMEN:  No Specimen  DISPOSITION OF SPECIMEN:  N/A  COUNTS:  YES  TOURNIQUET:   Total Tourniquet Time Documented: Upper Arm (Left) - 31 minutes  DICTATION: .Other Dictation: Dictation Number [id number]  PLAN OF CARE: Admit to inpatient   PATIENT DISPOSITION:  PACU - hemodynamically stable.   Delay start of Pharmacological VTE agent (>24hrs) due to surgical blood loss or risk of bleeding: not applicable

## 2012-01-18 NOTE — Anesthesia Postprocedure Evaluation (Signed)
  Anesthesia Post-op Note  Patient: Amy Barron  Procedure(s) Performed: Procedure(s) (LRB): OPEN REDUCTION INTERNAL FIXATION (ORIF) WRIST FRACTURE (Left)  Patient Location: PACU  Anesthesia Type: General and GA combined with regional for post-op pain  Level of Consciousness: awake, alert  and oriented  Airway and Oxygen Therapy: Patient Spontanous Breathing and Patient connected to nasal cannula oxygen  Post-op Pain: none  Post-op Assessment: Post-op Vital signs reviewed and Patient's Cardiovascular Status Stable  Post-op Vital Signs: stable  Complications: No apparent anesthesia complications

## 2012-01-18 NOTE — ED Notes (Signed)
Ice applied to wrist.

## 2012-01-19 ENCOUNTER — Encounter (HOSPITAL_COMMUNITY): Payer: Self-pay | Admitting: *Deleted

## 2012-01-19 DIAGNOSIS — S52502A Unspecified fracture of the lower end of left radius, initial encounter for closed fracture: Secondary | ICD-10-CM | POA: Diagnosis present

## 2012-01-19 NOTE — Progress Notes (Signed)
Subjective: 1 Day Post-Op Procedure(s) (LRB): OPEN REDUCTION INTERNAL FIXATION (ORIF) WRIST FRACTURE (Left) Patient reports pain as mild.    Objective: Vital signs in last 24 hours: Temp:  [96.8 F (36 C)-99.2 F (37.3 C)] 98.2 F (36.8 C) (06/26 0551) Pulse Rate:  [59-82] 78  (06/26 0551) Resp:  [11-20] 16  (06/26 0551) BP: (121-160)/(56-87) 121/61 mmHg (06/26 0551) SpO2:  [94 %-100 %] 95 % (06/26 0551)  Intake/Output from previous day: 06/25 0701 - 06/26 0700 In: 700 [I.V.:700] Out: 700 [Urine:700] Intake/Output this shift:     Basename 01/18/12 1444  HGB 13.6    Basename 01/18/12 1444  WBC 11.5*  RBC 4.35  HCT 40.7  PLT 249    Basename 01/18/12 1444  NA 139  K 4.0  CL 101  CO2 26  BUN 20  CREATININE 0.71  GLUCOSE 120*  CALCIUM 10.1    Basename 01/18/12 1444  LABPT --  INR 0.91    Neurovascular intact Sensation intact distally  Assessment/Plan: 1 Day Post-Op Procedure(s) (LRB): OPEN REDUCTION INTERNAL FIXATION (ORIF) WRIST FRACTURE (Left) Advance diet Discharge home today Elevation of hand nonweight bearing of left upper extremity OV 1 week Keep splint dry and clean Has rx for percocet COD stable  Dejohn Ibarra M 01/19/2012, 8:21 AM

## 2012-01-19 NOTE — Progress Notes (Signed)
Patient ID: Amy Barron, female   DOB: 04-11-1941, 71 y.o.   MRN: [id number] Pt. Discharged 01/19/2012  8:38 AM Discharge instructions reviewed with patient/family. Patient/family verbalized understanding. All Rx's given. Questions answered as needed. Pt. Discharged to home with family/self.  Lurline Idol [facility]

## 2012-01-19 NOTE — Op Note (Signed)
NAMECRISTABEL, Amy Barron               ACCOUNT NO.:  [id number]  MEDICAL RECORD NO.:  [id number]  LOCATION:  5158                         FACILITY:  [facility]  PHYSICIAN:  Cydne Grahn C. Ophelia Charter, M.D.    DATE OF BIRTH:  09-24-1940  DATE OF PROCEDURE:  01/18/2012 DATE OF DISCHARGE:                              OPERATIVE REPORT   PREOPERATIVE DIAGNOSIS:  Left distal radius fracture.  POSTOPERATIVE DIAGNOSIS:  Left distal radius fracture.  PROCEDURE:  Open reduction and internal fixation of left distal radius with hand innovation, volar plating.  SURGEON:  Liisa Picone C. Ophelia Charter, MD  ANESTHESIA:  GOT.  TOURNIQUET TIME:  35 minutes.  COMPLICATIONS:  None.  PROCEDURE:  After induction of preoperative scalene block anesthesia followed by prepping and draping.  As prepping was performed and her wrist point was removed, she had some increased pain, feeling and general anesthesia was given.  There was partial block.  Preoperative Ancef was given prophylactically, 1 g initially and then a second dose was given once she was back on the operating room.  Time-out procedure was completed.  Sterile skin marker was used for planned volar approach over the FCR.  Incision was made.  Sheath over the FCR was split, posterior sheath was split and then the pronator was pulled off from the radial aspect of the radius toward the ulna and fracture was reduced, held with the pin through the styloid.  Bone was relatively soft.  There was significant shortening and pin was necessary to hold the fracture out to length, and hole was drilled and the plate was slid and adjusted until was in appropriate distance alignment.  Distal holes were drilled first starting on the ulnar aspect distal row, then proximal row. Sixteen smooth pegs were used x2.  The rest were all 18 smooth pegs except for 20-mm styloid partially-threaded screw.  Fluoroscopic pictures were used to confirm excellent position and alignment.  There was good  reduction.  A second 12-mm proximal screw was placed in the proximal end of the plate.  The tourniquet was deflated.  Hemostasis was obtained.  Wound was irrigated and standard closure with 0 Vicryl in the subcutaneous tissue, 4-0 Vicryl in the subcuticular closure.  Tincture of benzoin, Steri-Strips, twiners, 4x4s and soft dressing with volar splint applied, fiberglass prefab and then Ace wraps.  Instrument count and needle count were correct.  The patient was transferred to the recovery room in stable condition.     Raydan Schlabach C. Ophelia Charter, M.D.     MCY/MEDQ  D:  01/18/2012  T:  01/19/2012  Job:  [id number]

## 2012-01-19 NOTE — Discharge Summary (Signed)
Physician Discharge Summary  Patient ID: Amy Barron MRN: [id number] DOB/AGE: [birth year] 71 y.o.  Admit date: 01/18/2012 Discharge date: 01/19/2012  Admission Diagnoses:  Distal radius fracture, left  Discharge Diagnoses:  Principal Problem:  *Distal radius fracture, left   Past Medical History  Diagnosis Date  . Hypertension   . Hyperlipidemia     Surgeries: Procedure(s): OPEN REDUCTION INTERNAL FIXATION (ORIF) WRIST FRACTURE on 01/18/2012   Consultants (if any):  none  Discharged Condition: Improved  Hospital Course: Amy Barron is an 71 y.o. female who was admitted 01/18/2012 with a diagnosis of Distal radius fracture, left and went to the operating room on 01/18/2012 and underwent the above named procedures.    She was given perioperative antibiotics:  Anti-infectives     Start     Dose/Rate Route Frequency Ordered Stop   01/18/12 1508   ceFAZolin (ANCEF) 1-5 GM-% IVPB     Comments: FAY, KATHY: cabinet override         01/18/12 1508 01/19/12 0314   01/18/12 1445   ceFAZolin (ANCEF) IVPB 1 g/50 mL premix  Status:  Discontinued        1 g 100 mL/hr over 30 Minutes Intravenous 60 min pre-op 01/18/12 1445 01/18/12 2341        .  She was given sequential compression devices, early ambulation.  She benefited maximally from the hospital stay and there were no complications.    Recent vital signs:  Filed Vitals:   01/19/12 0551  BP: 121/61  Pulse: 78  Temp: 98.2 F (36.8 C)  Resp: 16    Recent laboratory studies:  Lab Results  Component Value Date   HGB 13.6 01/18/2012   Lab Results  Component Value Date   WBC 11.5* 01/18/2012   PLT 249 01/18/2012   Lab Results  Component Value Date   INR 0.91 01/18/2012   Lab Results  Component Value Date   NA 139 01/18/2012   K 4.0 01/18/2012   CL 101 01/18/2012   CO2 26 01/18/2012   BUN 20 01/18/2012   CREATININE 0.71 01/18/2012   GLUCOSE 120* 01/18/2012    Discharge Medications:   Medication List  As of  01/19/2012  3:04 PM   TAKE these medications         amLODipine 5 MG tablet   Commonly known as: NORVASC   Take 5 mg by mouth daily.      aspirin 81 MG tablet   Take 81 mg by mouth daily.      etodolac 400 MG tablet   Commonly known as: LODINE   Take 400 mg by mouth daily as needed. For arthritis      FISH OIL PO   Take 1 capsule by mouth once a week.      HYDROcodone-acetaminophen 5-325 MG per tablet   Commonly known as: NORCO   Take 2 tablets by mouth every 4 (four) hours as needed for pain.      naproxen sodium 220 MG tablet   Commonly known as: ANAPROX   Take 220 mg by mouth daily as needed.      Red Yeast Rice 600 MG Tabs   Take 1 tablet by mouth daily.      triamterene-hydrochlorothiazide 37.5-25 MG per tablet   Commonly known as: MAXZIDE-25   Take 1 tablet by mouth daily.            Diagnostic Studies: Dg Chest 2 View  01/18/2012  *RADIOLOGY REPORT*  Clinical Data: Hypertension,  left wrist fracture, preop.  CHEST - 2 VIEW  Comparison: None.  Findings:    Tortuous thoracic aorta.  Heart size upper limits normal.  Lungs are clear.  No effusion.  Degenerative spurring in the mid thoracic spine.  IMPRESSION:  1.  No acute disease.  Original Report Authenticated By: Osa Craver, M.D.   Dg Wrist Complete Left  01/18/2012  *RADIOLOGY REPORT*  Clinical Data: Fall, wrist pain/swelling  LEFT WRIST - COMPLETE 3+ VIEW  Comparison: None.  Findings: Comminuted distal radial fracture with apex volar angulation.  Intra-articular extension is suspected.  Mildly displaced ulnar styloid fracture.  Associated moderate soft tissue swelling.  IMPRESSION: Distal radial and ulnar fractures, as described above.  Original Report Authenticated By: Charline Bills, M.D.    Disposition: 01-Home or Self Care  Discharge Orders    Future Orders Please Complete By Expires   Diet - low sodium heart healthy      Call MD / Call 911      Comments:   If you experience chest pain or  shortness of breath, CALL 911 and be transported to the hospital emergency room.  If you develope a fever above 101 F, pus (white drainage) or increased drainage or redness at the wound, or calf pain, call your surgeon's office.   Constipation Prevention      Comments:   Drink plenty of fluids.  Prune juice may be helpful.  You may use a stool softener, such as Colace (over the counter) 100 mg twice a day.  Use MiraLax (over the counter) for constipation as needed.   Increase activity slowly as tolerated      Discharge instructions      Comments:   Keep splint dry and clean.  Elevation of hand above heart.  Move fingers frequently.      Follow-up Information    Follow up with Eldred Manges, MD. Schedule an appointment as soon as possible for a visit in 10 days.   Contact information:   [facility] [address] [place] [address] [phone]           Signed: Wende Neighbors 01/19/2012, 3:04 PM

## 2012-01-20 ENCOUNTER — Encounter (HOSPITAL_COMMUNITY): Payer: Self-pay | Admitting: Orthopaedic Surgery

## 2012-01-26 ENCOUNTER — Other Ambulatory Visit: Payer: Self-pay | Admitting: Family Medicine

## 2012-02-25 ENCOUNTER — Telehealth: Payer: Self-pay | Admitting: Family Medicine

## 2012-02-25 NOTE — Telephone Encounter (Signed)
Pt put on mondays schedule for Dr. Dayton Martes.

## 2012-02-25 NOTE — Telephone Encounter (Signed)
aller: Amy Barron/Patient; PCP: Gwinda Passe); CB#: [phone];  Call regarding Increased B/P, Dizziness and Slight H/A; Had dizziness 02/24/12 1400 and B/P was 147/101 that lasted about 30 mins and then had a slight H/A.  She did not take any meds and it went away at 1900. This AM her B/P at 0730 was 141/87.  she took her B/P meds and then at 0830 B/P was 129/79. Traiged Hypertension, Diagnosed/Suspected and all emergent SX R/O.  See in 72 hrs per nursing judgement for elevated B/P readings with other SX. Inst to keep a B/P log to show Dr. Call back inst given.

## 2012-02-28 ENCOUNTER — Encounter: Payer: Self-pay | Admitting: Family Medicine

## 2012-02-28 ENCOUNTER — Ambulatory Visit (INDEPENDENT_AMBULATORY_CARE_PROVIDER_SITE_OTHER): Payer: Medicare Other | Admitting: Family Medicine

## 2012-02-28 ENCOUNTER — Ambulatory Visit: Payer: Medicare Other

## 2012-02-28 ENCOUNTER — Ambulatory Visit: Payer: Medicare Other | Admitting: Family Medicine

## 2012-02-28 VITALS — BP 140/86 | HR 76 | Temp 97.9°F | Wt 165.0 lb

## 2012-02-28 DIAGNOSIS — H6120 Impacted cerumen, unspecified ear: Secondary | ICD-10-CM

## 2012-02-28 DIAGNOSIS — I10 Essential (primary) hypertension: Secondary | ICD-10-CM

## 2012-02-28 NOTE — Progress Notes (Signed)
71 yo here to discuss HTN.  HTN- has stable on current medications.  Has been on Triamtere/HCTZ 37/5/25 and Amlodipine 5 mg daily for years.    Brings in BP log- most readings 120s-130s/80s with one reading 140/92. No CP, SOB, Le edema,or blurred vision. Lab Results  Component Value Date   CREATININE 0.71 01/18/2012   She was concerned that her blood pressure was not controlled because she has had two episodes of dizziness- once while walking and another time turning in bed.  No nausea or vomiting.  Symptoms resolved within seconds if she rested and closed her eyes.       Patient Active Problem List  Diagnosis  . HYPERLIPIDEMIA  . HYPERTENSION  . Routine general medical examination at a health care facility  . Low back pain  . Distal radius fracture, left   Past Medical History  Diagnosis Date  . Hypertension   . Hyperlipidemia    Past Surgical History  Procedure Date  . Abdominal hysterectomy   . Parathyroidectomy   . Orif wrist fracture 01/18/2012    Procedure: OPEN REDUCTION INTERNAL FIXATION (ORIF) WRIST FRACTURE;  Surgeon: Eldred Manges, MD;  Location: [facility] OR;  Service: Orthopedics;  Laterality: Left;   History  Substance Use Topics  . Smoking status: Never Smoker   . Smokeless tobacco: Not on file  . Alcohol Use: No   Family History  Problem Relation Age of Onset  . Cancer Father    No Known Allergies Current Outpatient Prescriptions on File Prior to Visit  Medication Sig Dispense Refill  . amLODipine (NORVASC) 5 MG tablet Take 5 mg by mouth daily.      Marland Kitchen aspirin 81 MG tablet Take 81 mg by mouth daily.        . clotrimazole-betamethasone (LOTRISONE) cream APPLY TO AFFECTED AREA TWICE DAILY  30 g  0  . etodolac (LODINE) 400 MG tablet Take 400 mg by mouth daily as needed. For arthritis      . naproxen sodium (ANAPROX) 220 MG tablet Take 220 mg by mouth daily as needed.      . Omega-3 Fatty Acids (FISH OIL PO) Take 1 capsule by mouth once a week.      . Red Yeast  Rice 600 MG TABS Take 1 tablet by mouth daily.       Marland Kitchen triamterene-hydrochlorothiazide (MAXZIDE-25) 37.5-25 MG per tablet Take 1 tablet by mouth daily.       The PMH, PSH, Social History, Family History, Medications, and allergies have been reviewed in [facility], and have been updated if relevant.  Review of Systems       See HPI  Physical Exam BP 140/86  Pulse 76  Temp 97.9 F (36.6 C)  Wt 165 lb (74.844 kg)  General:  alert, well-developed, and well-nourished.   Head:  normocephalic and atraumatic.   Eyes:  vision grossly intact, pupils equal, pupils round, and pupils reactive to light.   Ears:   L ear normal.   Right ear: Ceruminosis is noted.   Nose:  no external deformity.   Mouth:  good dentition.   Neck:  No deformities, masses, or tenderness noted. Lungs:  Normal respiratory effort, chest expands symmetrically. Lungs are clear to auscultation, no crackles or wheezes. Heart:  Normal rate and regular rhythm. S1 and S2 normal without gallop, murmur, click, rub or other extra sounds. Abdomen:  Bowel sounds positive,abdomen soft and non-tender without masses, organomegaly or hernias noted. Msk:  No deformity or scoliosis noted  of thoracic or lumbar spine.   Extremities:  no edema varicose veins bilaterally, nontender to palp Neurologic:  alert & oriented X3 and gait normal.   Skin:  Intact without suspicious lesions or rashes Psych:  Cognition and judgment appear intact. Alert and cooperative with normal attention span and concentration. No apparent delusions, illusions, hallucinations  Assessment and Plan:  1. HYPERTENSION  Stable- dizziness likely due to vertigo triggered by unilateral cerumen impaction. See below.  2. Cerumen impaction  Wax is removed by syringing and manual debridement. Instructions for home care to prevent wax buildup are given.

## 2012-02-28 NOTE — Patient Instructions (Signed)
Cerumen Impaction  A cerumen impaction is when the wax in your ear forms a plug. This plug usually causes reduced hearing. Sometimes it also causes an earache or dizziness. Removing a cerumen impaction can be difficult and painful. The wax sticks to the ear canal. The canal is sensitive and bleeds easily. If you try to remove a heavy wax buildup with a cotton tipped swab, you may push it in further.  Irrigation with water, suction, and small ear curettes may be used to clear out the wax. If the impaction is fixed to the skin in the ear canal, ear drops may be needed for a few days to loosen the wax. People who build up a lot of wax frequently can use ear wax removal products available in your local drugstore.  SEEK MEDICAL CARE IF:    You develop an earache, increased hearing loss, or marked dizziness.  Document Released: 08/19/2004 Document Revised: 07/01/2011 Document Reviewed: 10/09/2009  ExitCare Patient Information 2012 [employer].

## 2012-04-18 ENCOUNTER — Other Ambulatory Visit: Payer: Self-pay | Admitting: Family Medicine

## 2012-04-18 NOTE — Telephone Encounter (Signed)
Pt request 90 day rx for triamterene-HCTZ to [facility]. Pt notified refill done.

## 2012-04-18 NOTE — Addendum Note (Signed)
Addended by: Eliezer Bottom on: 04/18/2012 03:12 PM   Modules accepted: Orders

## 2012-04-18 NOTE — Telephone Encounter (Signed)
90 day supply has been sent to pharmacy, per pharmacist at [facility].

## 2012-04-25 ENCOUNTER — Encounter: Payer: Self-pay | Admitting: Family Medicine

## 2012-04-26 ENCOUNTER — Ambulatory Visit (INDEPENDENT_AMBULATORY_CARE_PROVIDER_SITE_OTHER): Payer: Medicare Other

## 2012-04-26 DIAGNOSIS — Z23 Encounter for immunization: Secondary | ICD-10-CM

## 2012-04-28 ENCOUNTER — Encounter: Payer: Self-pay | Admitting: Family Medicine

## 2012-05-02 ENCOUNTER — Encounter: Payer: Self-pay | Admitting: Family Medicine

## 2012-05-08 ENCOUNTER — Telehealth: Payer: Self-pay | Admitting: Family Medicine

## 2012-05-08 NOTE — Telephone Encounter (Signed)
Caller: [name]/Patient; Patient Name: Amy Barron; PCP: Ruthe Mannan [facility]); Best Callback Phone Number: [phone]. Call regarding sneezing and coughing at nght. Afebrile.  Non productive cough.  Onset 05/04/12.  Pt taking Benadryl at night and that seems to help.  Unable to take Benadryl during day d/t the drowsy effects.  Alka Seltzer Cold medicine seems to help but gives pt nightmares.  All emergent symptoms ruled out per Upper Respiratory Infection protocol with exception to 'Recent ro recurrent episodes of sneezing, nasal congestion, watery nasal drainage; scratchy/itchy throat; red/itchy watery eyes or cough unrelieved after one week of home care measures'.  See Provider in 2 weeks.  Home Care advice given. Offered appt and pt states she will try home treatment and call back for an appt if symptoms do not improve in the next 2-3 days.  Instructed pt to speak with her pharmacist in regard to OTC medications and to avoid phenylephrine.

## 2012-06-05 ENCOUNTER — Encounter: Payer: Self-pay | Admitting: Internal Medicine

## 2012-07-28 ENCOUNTER — Encounter: Payer: Self-pay | Admitting: Internal Medicine

## 2012-08-13 ENCOUNTER — Other Ambulatory Visit: Payer: Self-pay | Admitting: Family Medicine

## 2012-08-20 ENCOUNTER — Other Ambulatory Visit: Payer: Self-pay | Admitting: Family Medicine

## 2012-09-04 ENCOUNTER — Encounter: Payer: Self-pay | Admitting: Internal Medicine

## 2012-09-04 ENCOUNTER — Ambulatory Visit (AMBULATORY_SURGERY_CENTER): Payer: Medicare Other | Admitting: *Deleted

## 2012-09-04 VITALS — Ht 64.0 in | Wt 170.8 lb

## 2012-09-04 DIAGNOSIS — Z1211 Encounter for screening for malignant neoplasm of colon: Secondary | ICD-10-CM

## 2012-09-04 DIAGNOSIS — Z800 Family history of malignant neoplasm of digestive organs: Secondary | ICD-10-CM

## 2012-09-04 MED ORDER — MOVIPREP 100 G PO SOLR: ORAL | Status: DC

## 2012-09-19 ENCOUNTER — Encounter: Payer: Self-pay | Admitting: Internal Medicine

## 2012-09-19 ENCOUNTER — Ambulatory Visit (AMBULATORY_SURGERY_CENTER): Payer: Medicare Other | Admitting: Internal Medicine

## 2012-09-19 VITALS — BP 125/83 | HR 59 | Temp 96.3°F | Resp 21 | Ht 64.0 in | Wt 170.0 lb

## 2012-09-19 DIAGNOSIS — D126 Benign neoplasm of colon, unspecified: Secondary | ICD-10-CM

## 2012-09-19 DIAGNOSIS — Z800 Family history of malignant neoplasm of digestive organs: Secondary | ICD-10-CM

## 2012-09-19 DIAGNOSIS — Z1211 Encounter for screening for malignant neoplasm of colon: Secondary | ICD-10-CM

## 2012-09-19 MED ORDER — SODIUM CHLORIDE 0.9 % IV SOLN: 500.0000 mL | INTRAVENOUS | Status: DC

## 2012-09-19 NOTE — Patient Instructions (Addendum)

## 2012-09-19 NOTE — Progress Notes (Signed)
Called to room to assist during endoscopic procedure.  Patient ID and intended procedure confirmed with present staff. Received instructions for my participation in the procedure from the performing physician.  

## 2012-09-19 NOTE — Op Note (Signed)
[facility] [address] [place], [address]   COLONOSCOPY PROCEDURE REPORT  PATIENT: Annia, Gomm  MR#: [id number] BIRTHDATE: Jul 29, 1940 , 71  yrs. old GENDER: Female ENDOSCOPIST: Roxy Cedar, MD REFERRED [id number] Recall PROCEDURE DATE:  09/19/2012 PROCEDURE:   Colonoscopy with snare polypectomy  x 1 ASA CLASS:   Class II INDICATIONS:Patient's immediate family history of colon cancer (parent 64's); last exam 05-2007 negative . MEDICATIONS: MAC sedation, administered by CRNA and propofol (Diprivan) 200mg  IV  DESCRIPTION OF PROCEDURE:   After the risks benefits and alternatives of the procedure were thoroughly explained, informed consent was obtained.  A digital rectal exam revealed no abnormalities of the rectum.   The LB [id number]  endoscope was introduced through the anus and advanced to the cecum, which was identified by both the appendix and ileocecal valve. No adverse events experienced.   The quality of the prep was good, using MoviPrep  The instrument was then slowly withdrawn as the colon was fully examined.      COLON FINDINGS: A single polyp measuring 5 mm in size was found in the transverse colon.  A polypectomy was performed with a cold snare.  The resection was complete and the polyp tissue was completely retrieved.   Mild diverticulosis was noted in the sigmoid colon.   The colon mucosa was otherwise normal. Retroflexed views revealed internal hemorrhoids. The time to cecum=2 minutes 59 seconds.  Withdrawal time=10 minutes 30 seconds. The scope was withdrawn and the procedure completed. COMPLICATIONS: There were no complications.  ENDOSCOPIC IMPRESSION: 1.   Single polyp measuring 5 mm in size was found in the transverse colon; polypectomy was performed with a cold snare 2.   Mild diverticulosis was noted in the sigmoid colon 3.   The colon mucosa was otherwise normal  RECOMMENDATIONS: 1. Follow up colonoscopy in 5  years   eSigned:  Roxy Cedar, MD 09/19/2012 9:55 AM   cc: Ruthe Mannan MD and The Patient   PATIENT NAME:  Lauriana, Denes MR#: [id number]

## 2012-09-19 NOTE — Progress Notes (Signed)
Patient did not experience any of the following events: a burn prior to discharge; a fall within the facility; wrong site/side/patient/procedure/implant event; or a hospital transfer or hospital admission upon discharge from the facility. (G8907) Patient did not have preoperative order for IV antibiotic SSI prophylaxis. (G8918)  

## 2012-09-20 ENCOUNTER — Telehealth: Payer: Self-pay | Admitting: *Deleted

## 2012-09-20 NOTE — Telephone Encounter (Signed)
  Follow up Call-  Call back number 09/19/2012  Post procedure Call Back phone  # [phone]  Permission to leave phone message Yes     Patient questions:  Do you have a fever, pain , or abdominal swelling? no Pain Score  0 *  Have you tolerated food without any problems? yes  Have you been able to return to your normal activities? yes  Do you have any questions about your discharge instructions: Diet   no Medications  no Follow up visit  no  Do you have questions or concerns about your Care? no  Actions: * If pain score is 4 or above: No action needed, pain <4.

## 2012-09-26 ENCOUNTER — Encounter: Payer: Self-pay | Admitting: Internal Medicine

## 2012-10-11 ENCOUNTER — Encounter: Payer: Self-pay | Admitting: Family Medicine

## 2012-12-01 ENCOUNTER — Ambulatory Visit (INDEPENDENT_AMBULATORY_CARE_PROVIDER_SITE_OTHER): Payer: Medicare Other | Admitting: Family Medicine

## 2012-12-01 ENCOUNTER — Encounter: Payer: Self-pay | Admitting: Family Medicine

## 2012-12-01 VITALS — BP 124/74 | HR 80 | Temp 98.3°F | Wt 168.0 lb

## 2012-12-01 DIAGNOSIS — J309 Allergic rhinitis, unspecified: Secondary | ICD-10-CM

## 2012-12-01 MED ORDER — FLUTICASONE PROPIONATE 50 MCG/ACT NA SUSP: 2.0000 | Freq: Every day | NASAL | Status: DC

## 2012-12-01 NOTE — Progress Notes (Signed)
SUBJECTIVE:  Amy Barron is a 72 y.o. female who complains of coryza, congestion, sneezing and itching in eyes for 7 days. She denies a history of anorexia, chest pain, chills, dizziness, fatigue, fevers, myalgias, nausea and shortness of breath and denies a history of asthma. Patient denies smoke cigarettes.   Patient Active Problem List   Diagnosis Date Noted  . Distal radius fracture, left 01/19/2012    Class: Diagnosis of  . HYPERLIPIDEMIA 09/30/2010  . HYPERTENSION 09/30/2010   Past Medical History  Diagnosis Date  . Hypertension   . Hyperlipidemia    Past Surgical History  Procedure Laterality Date  . Abdominal hysterectomy  1998  . Parathyroidectomy    . Orif wrist fracture  01/18/2012    Procedure: OPEN REDUCTION INTERNAL FIXATION (ORIF) WRIST FRACTURE;  Surgeon: Eldred Manges, MD;  Location: [facility] OR;  Service: Orthopedics;  Laterality: Left;   History  Substance Use Topics  . Smoking status: Never Smoker   . Smokeless tobacco: Never Used  . Alcohol Use: No   Family History  Problem Relation Age of Onset  . Cancer Father   . Colon cancer Father 50   No Known Allergies Current Outpatient Prescriptions on File Prior to Visit  Medication Sig Dispense Refill  . amLODipine (NORVASC) 5 MG tablet TAKE 1 TABLET (5 MG TOTAL) BY MOUTH DAILY.  30 tablet  5  . aspirin 81 MG tablet Take 81 mg by mouth daily.        . clotrimazole-betamethasone (LOTRISONE) cream APPLY TO AFFECTED AREA TWICE DAILY  45 g  0  . etodolac (LODINE) 400 MG tablet TAKE 1 TABLET BY MOUTH ONCE A DAY AS DIRECTED  30 tablet  5  . Omega-3 Fatty Acids (FISH OIL PO) Take 1 capsule by mouth once a week.      . Red Yeast Rice 600 MG TABS Take 1 tablet by mouth daily.       Marland Kitchen triamterene-hydrochlorothiazide (MAXZIDE-25) 37.5-25 MG per tablet TAKE 1 TABLET BY MOUTH ONCE A DAY  90 tablet  3   No current facility-administered medications on file prior to visit.   The PMH, PSH, Social History, Family History,  Medications, and allergies have been reviewed in [facility], and have been updated if relevant.  OBJECTIVE: BP 124/74  Pulse 80  Temp(Src) 98.3 F (36.8 C)  Wt 168 lb (76.204 kg)  BMI 28.82 kg/m2  She appears well, vital signs are as noted. Ears normal.  Throat and pharynx normal.  Neck supple. No adenopathy in the neck. Nose is congested. Sinuses non tender. The chest is clear, without wheezes or rales.  ASSESSMENT:  viral upper respiratory illness/allergic rhinitis  PLAN: Symptomatic therapy suggested: push fluids, rest and return office visit prn if symptoms persist or worsen. Lack of antibiotic effectiveness discussed with her. Call or return to clinic prn if these symptoms worsen or fail to improve as anticipated.

## 2012-12-01 NOTE — Patient Instructions (Addendum)
Good to see you. Let's add Flonase ( I sent in a prescription). Continue Claritin. Add Delsym (over the counter) as needed for cough.  Call me on Monday if you're not feeling better.  Have a good a weekend.

## 2012-12-11 ENCOUNTER — Telehealth: Payer: Self-pay | Admitting: Family Medicine

## 2012-12-11 MED ORDER — AMOXICILLIN 875 MG PO TABS: 875.0000 mg | ORAL_TABLET | Freq: Two times a day (BID) | ORAL | Status: DC

## 2012-12-11 NOTE — Telephone Encounter (Signed)
Pt was returning missed call from the office. Per EPIC, RN found the message from Dr Dayton Martes:  [name], MD at 12/11/2012 8:36 AM   Status: Signed            [facility] Please continue support care- OTC and nasal spray. Call us with an update if not improving this week.   Pt verbalized understanding.

## 2012-12-11 NOTE — Telephone Encounter (Signed)
Caller: Robyne/Patient; Phone: [phone]; Reason for Call: Pt states she was seen in the office 12/01/12 and was advised to call back if not improve with OTC and nasal spray.  Pt states provider advised she would call in antibiotic.  PLEASE F/U WITH PT, THANK YOU.

## 2012-12-11 NOTE — Telephone Encounter (Signed)
Left message asking patient to call back

## 2012-12-11 NOTE — Telephone Encounter (Signed)
[facility]  Please continue support care- OTC and nasal spray.  Call us with an update if not improving this week.

## 2013-02-05 ENCOUNTER — Other Ambulatory Visit: Payer: Self-pay | Admitting: Family Medicine

## 2013-04-06 ENCOUNTER — Other Ambulatory Visit: Payer: Self-pay | Admitting: Family Medicine

## 2013-04-10 ENCOUNTER — Other Ambulatory Visit: Payer: Self-pay | Admitting: Family Medicine

## 2013-05-03 ENCOUNTER — Encounter: Payer: Self-pay | Admitting: Family Medicine

## 2013-05-03 ENCOUNTER — Other Ambulatory Visit: Payer: Self-pay | Admitting: Family Medicine

## 2013-05-08 ENCOUNTER — Ambulatory Visit (INDEPENDENT_AMBULATORY_CARE_PROVIDER_SITE_OTHER): Payer: Medicare Other

## 2013-05-08 DIAGNOSIS — Z23 Encounter for immunization: Secondary | ICD-10-CM

## 2013-05-16 IMAGING — CR DG CHEST 2V
2 series · 2 of 2 positions shown · non-contrast
Comparison: None.

CLINICAL DATA: Hypertension, left wrist fracture, preop.

CHEST - 2 VIEW

[view not recorded (1 of 2)]
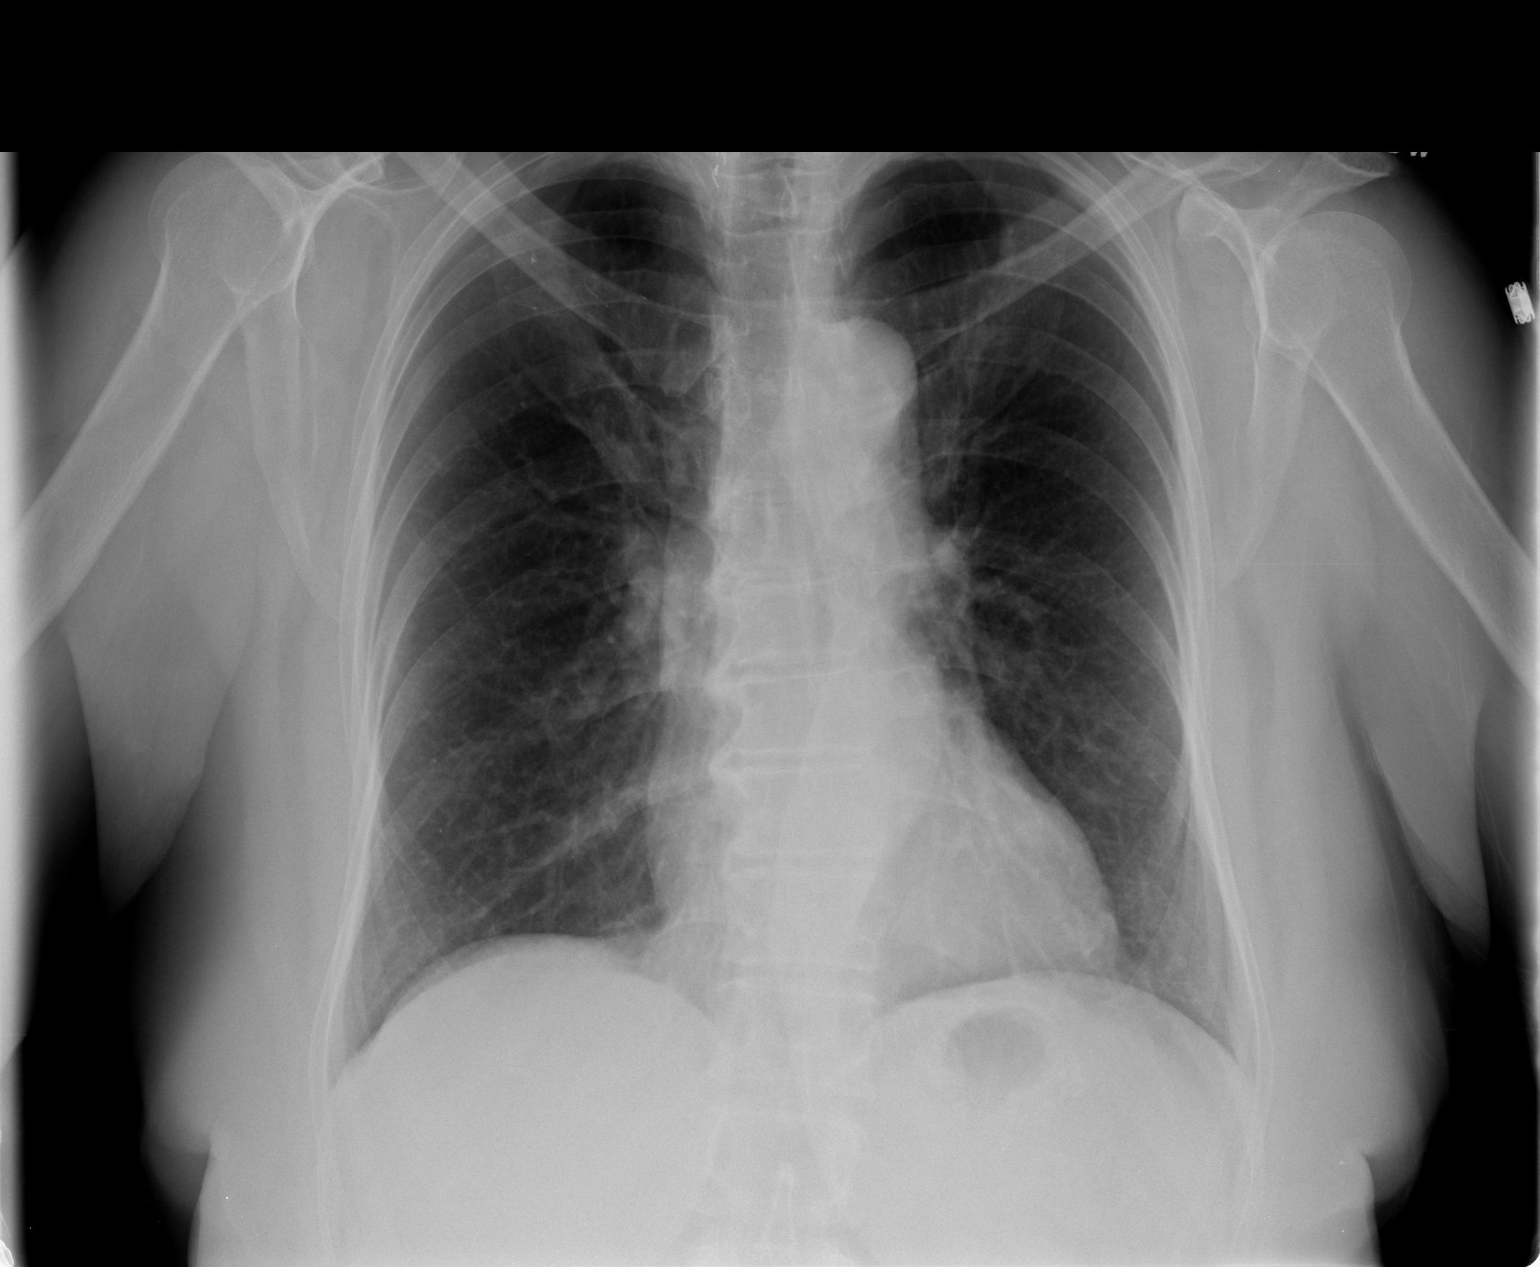

[view not recorded (2 of 2)]
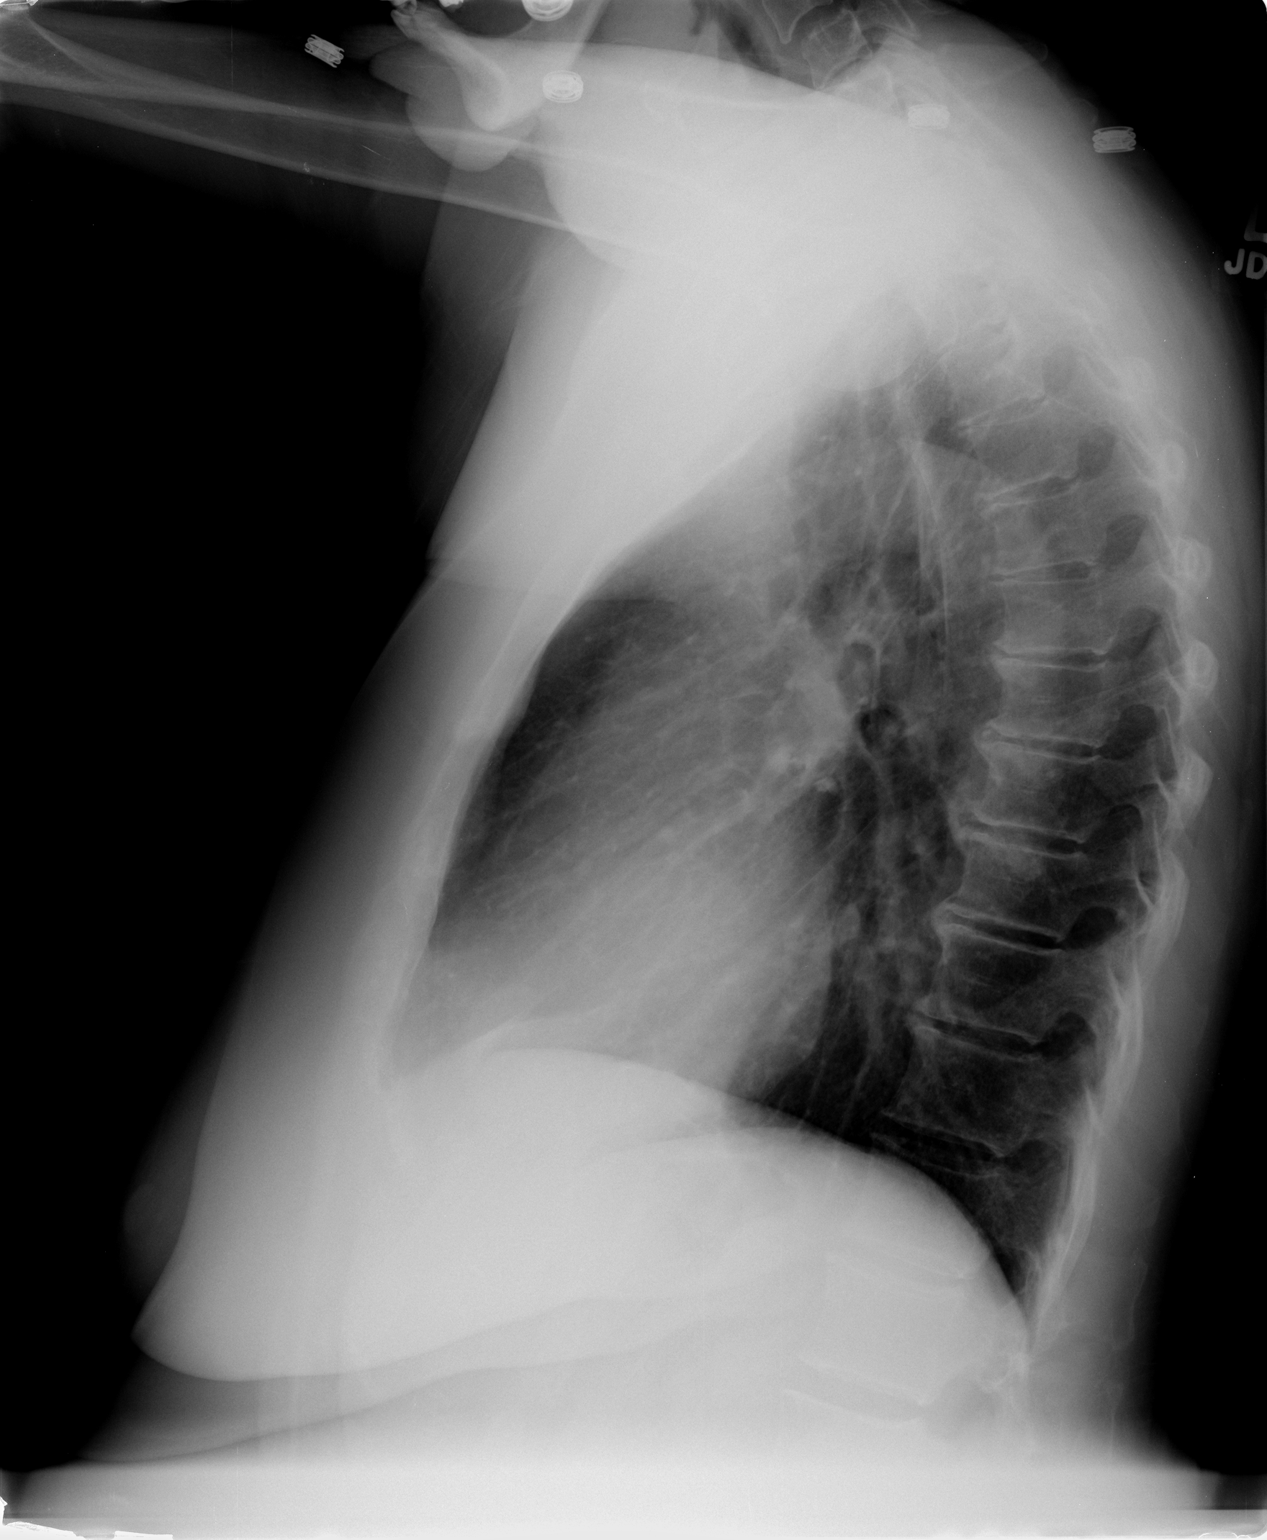

[2 of 2 positions shown; findings below may reference images not displayed]

FINDINGS: Tortuous thoracic aorta.  Heart size upper limits
normal.  Lungs are clear.  No effusion.  Degenerative spurring in
the mid thoracic spine.
IMPRESSION: 1.  No acute disease.

## 2013-06-09 ENCOUNTER — Other Ambulatory Visit: Payer: Self-pay | Admitting: Family Medicine

## 2013-06-12 ENCOUNTER — Other Ambulatory Visit: Payer: Self-pay | Admitting: Family Medicine

## 2013-07-09 ENCOUNTER — Other Ambulatory Visit: Payer: Self-pay | Admitting: Family Medicine

## 2013-07-09 NOTE — Telephone Encounter (Signed)
Last office visit 12/11/2012.  Ok to refill?

## 2013-07-13 ENCOUNTER — Encounter: Payer: Self-pay | Admitting: Internal Medicine

## 2013-07-13 ENCOUNTER — Ambulatory Visit (INDEPENDENT_AMBULATORY_CARE_PROVIDER_SITE_OTHER): Payer: Medicare Other | Admitting: Internal Medicine

## 2013-07-13 VITALS — BP 138/72 | HR 70 | Temp 98.2°F | Ht 63.5 in | Wt 166.2 lb

## 2013-07-13 DIAGNOSIS — H6980 Other specified disorders of Eustachian tube, unspecified ear: Secondary | ICD-10-CM

## 2013-07-13 DIAGNOSIS — H6983 Other specified disorders of Eustachian tube, bilateral: Secondary | ICD-10-CM

## 2013-07-13 MED ORDER — CHLORTHALIDONE 25 MG PO TABS: ORAL_TABLET | ORAL | Status: DC

## 2013-07-13 NOTE — Progress Notes (Signed)
Pre-visit discussion using our clinic review tool. No additional management support is needed unless otherwise documented below in the visit note.  

## 2013-07-13 NOTE — Patient Instructions (Signed)
Tinnitus  Sounds you hear in your ears and coming from within the ear is called tinnitus. This can be a symptom of many ear disorders. It is often associated with hearing loss.   Tinnitus can be seen with:  · Infections.  · Ear blockages such as wax buildup.  · Meniere's disease.  · Ear damage.  · Inherited.  · Occupational causes.  While irritating, it is not usually a threat to health. When the cause of the tinnitus is wax, infection in the middle ear, or foreign body it is easily treated. Hearing loss will usually be reversible.   TREATMENT   When treating the underlying cause does not get rid of tinnitus, it may be necessary to get rid of the unwanted sound by covering it up with more pleasant background noises. This may include music, the radio etc. There are tinnitus maskers which can be worn which produce background noise to cover up the tinnitus.  Avoid all medications which tend to make tinnitus worse such as alcohol, caffeine, aspirin, and nicotine. There are many soothing background tapes such as rain, ocean, thunderstorms, etc. These soothing sounds help with sleeping or resting.  Keep all follow-up appointments and referrals. This is important to identify the cause of the problem. It also helps avoid complications, impaired hearing, disability, or chronic pain.  Document Released: 07/12/2005 Document Revised: 10/04/2011 Document Reviewed: 02/28/2008  ExitCare® Patient Information ©2014 [employer].

## 2013-07-13 NOTE — Progress Notes (Signed)
Subjective:    Patient ID: Amy Barron, female    DOB: 06-27-1941, 72 y.o.   MRN: [id number]  HPI  Pt presents to the clinic today with c/o tiniitus in both ears. This started about 5 days ago. She describes it as a constant buzzing sound. She denies ear pain.She has not started any new medication in the last 2 weeks. She denies dizziness, headaches or blurred vision. She has tried sweet oil OTC without much relief.  Review of Systems  Past Medical History  Diagnosis Date  . Hypertension   . Hyperlipidemia     Current Outpatient Prescriptions  Medication Sig Dispense Refill  . amLODipine (NORVASC) 5 MG tablet TAKE 1 TABLET BY MOUTH EVERY DAY  30 tablet  0  . aspirin 81 MG tablet Take 81 mg by mouth daily.        . clotrimazole-betamethasone (LOTRISONE) cream APPLY TO AFFECTED AREA TWICE DAILY  45 g  0  . etodolac (LODINE) 400 MG tablet TAKE 1 TABLET BY MOUTH ONCE A DAY AS DIRECTED  30 tablet  5  . fluorouracil (EFUDEX) 5 % cream       . Omega-3 Fatty Acids (FISH OIL PO) Take 1 capsule by mouth once a week.      . Red Yeast Rice 600 MG TABS Take 1 tablet by mouth daily.       Marland Kitchen triamterene-hydrochlorothiazide (MAXZIDE-25) 37.5-25 MG per tablet TAKE 1 TABLET BY MOUTH ONCE A DAY  90 tablet  1  . triamterene-hydrochlorothiazide (MAXZIDE-25) 37.5-25 MG per tablet TAKE 1 TABLET BY MOUTH ONCE A DAY  90 tablet  0   No current facility-administered medications for this visit.    No Known Allergies  Family History  Problem Relation Age of Onset  . Cancer Father   . Colon cancer Father 50    History   Social History  . Marital Status: Married    Spouse Name: N/A    Number of Children: N/A  . Years of Education: N/A   Occupational History  . Not on file.   Social History Main Topics  . Smoking status: Never Smoker   . Smokeless tobacco: Never Used  . Alcohol Use: No  . Drug Use: No  . Sexual Activity: No   Other Topics Concern  . Not on file   Social History  Narrative  . No narrative on file     Constitutional: Denies fever, malaise, fatigue, headache or abrupt weight changes.  HEENT: Denies eye pain, eye redness, ear pain, wax buildup, runny nose, nasal congestion, bloody nose, or sore throat. Neurological: Denies dizziness, difficulty with memory, difficulty with speech or problems with balance and coordination.   No other specific complaints in a complete review of systems (except as listed in HPI above).     Objective:   Physical Exam   BP 138/72  Pulse 70  Temp(Src) 98.2 F (36.8 C) (Oral)  Ht 5' 3.5" (1.613 m)  Wt 166 lb 4 oz (75.411 kg)  BMI 28.98 kg/m2  SpO2 98% Wt Readings from Last 3 Encounters:  07/13/13 166 lb 4 oz (75.411 kg)  12/01/12 168 lb (76.204 kg)  09/19/12 170 lb (77.111 kg)    General: Appears her stated age, well developed, well nourished in NAD. HEENT: Head: normal shape and size; Eyes: sclera white, no icterus, conjunctiva pink, PERRLA and EOMs intact; Ears: Tm's gray and intact, normal light reflex, + effusion bilaterally; Nose: mucosa pink and moist, septum midline; Throat/Mouth: Teeth  present, mucosa pink and moist, no exudate, lesions or ulcerations noted.   Cardiovascular: Normal rate and rhythm. S1,S2 noted.  No murmur, rubs or gallops noted. No JVD or BLE edema. No carotid bruits noted. Pulmonary/Chest: Normal effort and positive vesicular breath sounds. No respiratory distress. No wheezes, rales or ronchi noted.  Neurological: Alert and oriented. Cranial nerves II-XII intact. Coordination normal. +DTRs bilaterally.  BMET    Component Value Date/Time   NA 139 01/18/2012 1444   K 4.0 01/18/2012 1444   CL 101 01/18/2012 1444   CO2 26 01/18/2012 1444   GLUCOSE 120* 01/18/2012 1444   BUN 20 01/18/2012 1444   CREATININE 0.71 01/18/2012 1444   CALCIUM 10.1 01/18/2012 1444   GFRNONAA 86* 01/18/2012 1444   GFRAA >90 01/18/2012 1444    Lipid Panel     Component Value Date/Time   CHOL 196 04/16/2011 1033     TRIG 99.0 04/16/2011 1033   HDL 52.20 04/16/2011 1033   CHOLHDL 4 04/16/2011 1033   VLDL 19.8 04/16/2011 1033   LDLCALC 124* 04/16/2011 1033    CBC    Component Value Date/Time   WBC 11.5* 01/18/2012 1444   RBC 4.35 01/18/2012 1444   HGB 13.6 01/18/2012 1444   HCT 40.7 01/18/2012 1444   PLT 249 01/18/2012 1444   MCV 93.6 01/18/2012 1444   MCH 31.3 01/18/2012 1444   MCHC 33.4 01/18/2012 1444   RDW 13.3 01/18/2012 1444    Hgb A1C No results found for this basename: HGBA1C        Assessment & Plan:   Eustacian Tube Dysfunction, bilateral:  Lets try [place] 25 mg daily x 5 days If this doesn't work, we will have you seen by ENT She refuses to take oral steroids  RTC in 1 week or sooner if needed

## 2013-07-25 ENCOUNTER — Ambulatory Visit: Payer: Medicare Other | Admitting: Family Medicine

## 2013-07-30 ENCOUNTER — Encounter: Payer: Self-pay | Admitting: Family Medicine

## 2013-07-30 ENCOUNTER — Ambulatory Visit (INDEPENDENT_AMBULATORY_CARE_PROVIDER_SITE_OTHER): Payer: Medicare Other | Admitting: Family Medicine

## 2013-07-30 VITALS — BP 122/72 | HR 80 | Temp 98.0°F | Ht 63.0 in | Wt 161.5 lb

## 2013-07-30 DIAGNOSIS — H6980 Other specified disorders of Eustachian tube, unspecified ear: Secondary | ICD-10-CM | POA: Insufficient documentation

## 2013-07-30 DIAGNOSIS — H6990 Unspecified Eustachian tube disorder, unspecified ear: Secondary | ICD-10-CM | POA: Insufficient documentation

## 2013-07-30 DIAGNOSIS — H6983 Other specified disorders of Eustachian tube, bilateral: Secondary | ICD-10-CM

## 2013-07-30 NOTE — Progress Notes (Signed)
Pre-visit discussion using our clinic review tool. No additional management support is needed unless otherwise documented below in the visit note.  

## 2013-07-30 NOTE — Assessment & Plan Note (Signed)
Claritn or Zyrtec D Also advised nasocort Call or return to clinic prn if these symptoms worsen or fail to improve as anticipated. See pt instructions for details.

## 2013-07-30 NOTE — Patient Instructions (Signed)
You have a lot of fluid in your ears. Take  claritin D or zyrtec D over the counter- two times a day as needed ( have to sign for them at pharmacy).   Try over the counter nasocort-start with 2 sprays per nostril per day...and then try to taper to 1 spray per nostril once symptoms improve.    Call if not improving as expected in 5-7 days.

## 2013-07-30 NOTE — Progress Notes (Signed)
   Subjective:    Patient ID: Amy Barron, female    DOB: 01-Jun-1941, 73 y.o.   MRN: [id number]  HPI  72 yo here for roaring in her ears for over a month.  Sometimes it's painful but mainly "annoying."  Has had no recent URI symptoms although sometimes her nose runs.  No hearing loss.  No cough or SOB.  Patient Active Problem List   Diagnosis Date Noted  . Eustachian tube dysfunction 07/30/2013  . Distal radius fracture, left 01/19/2012    Class: Diagnosis of  . HYPERLIPIDEMIA 09/30/2010  . HYPERTENSION 09/30/2010   Past Medical History  Diagnosis Date  . Hypertension   . Hyperlipidemia    Past Surgical History  Procedure Laterality Date  . Abdominal hysterectomy  1998  . Parathyroidectomy    . Orif wrist fracture  01/18/2012    Procedure: OPEN REDUCTION INTERNAL FIXATION (ORIF) WRIST FRACTURE;  Surgeon: Marybelle Killings, MD;  Location: [place];  Service: Orthopedics;  Laterality: Left;   History  Substance Use Topics  . Smoking status: Never Smoker   . Smokeless tobacco: Never Used  . Alcohol Use: No   Family History  Problem Relation Age of Onset  . Cancer Father   . Colon cancer Father 64   No Known Allergies Current Outpatient Prescriptions on File Prior to Visit  Medication Sig Dispense Refill  . amLODipine (NORVASC) 5 MG tablet TAKE 1 TABLET BY MOUTH EVERY DAY  30 tablet  0  . aspirin 81 MG tablet Take 81 mg by mouth daily.        . clotrimazole-betamethasone (LOTRISONE) cream APPLY TO AFFECTED AREA TWICE DAILY  45 g  0  . etodolac (LODINE) 400 MG tablet TAKE 1 TABLET BY MOUTH ONCE A DAY AS DIRECTED  30 tablet  5  . fluorouracil (EFUDEX) 5 % cream       . Omega-3 Fatty Acids (FISH OIL PO) Take 1 capsule by mouth once a week.      . Red Yeast Rice 600 MG TABS Take 1 tablet by mouth daily.       Marland Kitchen triamterene-hydrochlorothiazide (MAXZIDE-25) 37.5-25 MG per tablet TAKE 1 TABLET BY MOUTH ONCE A DAY  90 tablet  0   No current facility-administered medications on  file prior to visit.   The PMH, PSH, Social History, Family History, Medications, and allergies have been reviewed in [facility], and have been updated if relevant.   Review of Systems  Constitutional: Negative for activity change and appetite change.  HENT: Negative for dental problem, drooling, ear discharge, facial swelling, hearing loss, mouth sores, nosebleeds and sinus pressure.   All other systems reviewed and are negative.       Objective:   Physical Exam BP 122/72  Pulse 80  Temp(Src) 98 F (36.7 C) (Oral)  Ht 5\' 3"  (1.6 m)  Wt 161 lb 8 oz (73.256 kg)  BMI 28.62 kg/m2  SpO2 98% Gen:  Alert, pleasant, NAD HEENT: TMs retracted bilaterally, left > right No cerumen Sinuses NTTP No sinus erythema       Assessment & Plan:

## 2013-08-08 ENCOUNTER — Other Ambulatory Visit: Payer: Self-pay | Admitting: Internal Medicine

## 2013-10-02 ENCOUNTER — Other Ambulatory Visit: Payer: Self-pay | Admitting: Dermatology

## 2013-10-02 DIAGNOSIS — C44621 Squamous cell carcinoma of skin of unspecified upper limb, including shoulder: Secondary | ICD-10-CM | POA: Diagnosis not present

## 2013-10-02 DIAGNOSIS — L570 Actinic keratosis: Secondary | ICD-10-CM | POA: Diagnosis not present

## 2013-10-02 DIAGNOSIS — L080 Pyoderma: Secondary | ICD-10-CM | POA: Diagnosis not present

## 2013-11-19 DIAGNOSIS — H353190 Nonexudative age-related macular degeneration, unspecified eye, stage unspecified: Secondary | ICD-10-CM | POA: Diagnosis not present

## 2013-11-19 DIAGNOSIS — H402290 Chronic angle-closure glaucoma, unspecified eye, stage unspecified: Secondary | ICD-10-CM | POA: Diagnosis not present

## 2013-11-19 DIAGNOSIS — H409 Unspecified glaucoma: Secondary | ICD-10-CM | POA: Diagnosis not present

## 2013-11-29 DIAGNOSIS — Z85828 Personal history of other malignant neoplasm of skin: Secondary | ICD-10-CM | POA: Diagnosis not present

## 2013-11-29 DIAGNOSIS — L719 Rosacea, unspecified: Secondary | ICD-10-CM | POA: Diagnosis not present

## 2014-01-13 ENCOUNTER — Other Ambulatory Visit: Payer: Self-pay | Admitting: Family Medicine

## 2014-02-05 ENCOUNTER — Other Ambulatory Visit: Payer: Self-pay | Admitting: Family Medicine

## 2014-02-11 ENCOUNTER — Other Ambulatory Visit: Payer: Self-pay | Admitting: *Deleted

## 2014-02-11 MED ORDER — ETODOLAC 400 MG PO TABS: ORAL_TABLET | ORAL | Status: DC

## 2014-02-11 MED ORDER — TRIAMTERENE-HCTZ 37.5-25 MG PO TABS: ORAL_TABLET | ORAL | Status: DC

## 2014-02-15 ENCOUNTER — Ambulatory Visit: Payer: Medicare Other | Admitting: Family Medicine

## 2014-03-11 ENCOUNTER — Other Ambulatory Visit: Payer: Self-pay | Admitting: Family Medicine

## 2014-03-13 ENCOUNTER — Other Ambulatory Visit: Payer: Self-pay | Admitting: Family Medicine

## 2014-03-28 ENCOUNTER — Other Ambulatory Visit: Payer: Self-pay | Admitting: Family Medicine

## 2014-04-11 ENCOUNTER — Other Ambulatory Visit: Payer: Self-pay | Admitting: Family Medicine

## 2014-04-11 NOTE — Telephone Encounter (Signed)
Spoke to pt and advised her that OV is required. Sched appt moved from 9/23 to 9/18. Rx will be filled at the time of appt

## 2014-04-11 NOTE — Telephone Encounter (Addendum)
Pt left v/m requesting refill amlodipine and maxzide to [facility]; pt request cb. after reading comment that pt cancelled appt after filling med last time I am sending message to see if meds should be refilled prior to appt.Please advise.

## 2014-04-12 ENCOUNTER — Ambulatory Visit (INDEPENDENT_AMBULATORY_CARE_PROVIDER_SITE_OTHER): Payer: Medicare Other | Admitting: Family Medicine

## 2014-04-12 ENCOUNTER — Encounter: Payer: Self-pay | Admitting: Family Medicine

## 2014-04-12 VITALS — BP 124/72 | HR 58 | Temp 97.8°F | Wt 146.5 lb

## 2014-04-12 DIAGNOSIS — E785 Hyperlipidemia, unspecified: Secondary | ICD-10-CM

## 2014-04-12 DIAGNOSIS — Z23 Encounter for immunization: Secondary | ICD-10-CM

## 2014-04-12 DIAGNOSIS — I10 Essential (primary) hypertension: Secondary | ICD-10-CM | POA: Diagnosis not present

## 2014-04-12 DIAGNOSIS — F4321 Adjustment disorder with depressed mood: Secondary | ICD-10-CM | POA: Diagnosis not present

## 2014-04-12 LAB — COMPREHENSIVE METABOLIC PANEL
ALK PHOS: 64 U/L (ref 39–117)
ALT: 13 U/L (ref 0–35)
AST: 26 U/L (ref 0–37)
Albumin: 4.2 g/dL (ref 3.5–5.2)
BILIRUBIN TOTAL: 0.5 mg/dL (ref 0.2–1.2)
BUN: 14 mg/dL (ref 6–23)
CO2: 26 mEq/L (ref 19–32)
CREATININE: 0.9 mg/dL (ref 0.4–1.2)
Calcium: 9.5 mg/dL (ref 8.4–10.5)
Chloride: 102 mEq/L (ref 96–112)
GFR: 69.88 mL/min (ref >60.00)
Glucose, Bld: 103 mg/dL — ABNORMAL HIGH (ref 70–99)
Potassium: 3.6 mEq/L (ref 3.5–5.1)
Sodium: 140 mEq/L (ref 135–145)
Total Protein: 7.4 g/dL (ref 6.0–8.3)

## 2014-04-12 LAB — LIPID PANEL
Cholesterol: 236 mg/dL — ABNORMAL HIGH (ref 0–200)
HDL: 52.4 mg/dL (ref >39.00)
LDL Cholesterol: 156 mg/dL — ABNORMAL HIGH (ref 0–99)
NONHDL: 183.6
TRIGLYCERIDES: 140 mg/dL (ref 0.0–149.0)
Total CHOL/HDL Ratio: 5
VLDL: 28 mg/dL (ref 0.0–40.0)

## 2014-04-12 MED ORDER — AMLODIPINE BESYLATE 5 MG PO TABS: ORAL_TABLET | ORAL | Status: DC

## 2014-04-12 MED ORDER — TRIAMTERENE-HCTZ 37.5-25 MG PO TABS: ORAL_TABLET | ORAL | Status: DC

## 2014-04-12 NOTE — Progress Notes (Signed)
Pre visit review using our clinic review tool, if applicable. No additional management support is needed unless otherwise documented below in the visit note. 

## 2014-04-12 NOTE — Progress Notes (Signed)
73 yo here to discuss HTN.  HTN- has stable on current medications.  Has been on Triamtere/HCTZ 37/5/25 and Amlodipine 5 mg daily for years.     No CP, SOB, Le edema,or blurred vision. Lab Results  Component Value Date   CREATININE 0.71 01/18/2012   She has lost weight- unfortunately her husband died last month after a prolonged illness. She will be attending grief counseling soon. Sleeping ok.  Appetite slowly coming back. Wt Readings from Last 3 Encounters:  04/12/14 146 lb 8 oz (66.452 kg)  07/30/13 161 lb 8 oz (73.256 kg)  07/13/13 166 lb 4 oz (75.411 kg)        Patient Active Problem List   Diagnosis Date Noted  . Grief 04/12/2014  . Eustachian tube dysfunction 07/30/2013  . Distal radius fracture, left 01/19/2012    Class: Diagnosis of  . HYPERLIPIDEMIA 09/30/2010  . HYPERTENSION 09/30/2010   Past Medical History  Diagnosis Date  . Hypertension   . Hyperlipidemia    Past Surgical History  Procedure Laterality Date  . Abdominal hysterectomy  1998  . Parathyroidectomy    . Orif wrist fracture  01/18/2012    Procedure: OPEN REDUCTION INTERNAL FIXATION (ORIF) WRIST FRACTURE;  Surgeon: Marybelle Killings, MD;  Location: [place];  Service: Orthopedics;  Laterality: Left;   History  Substance Use Topics  . Smoking status: Never Smoker   . Smokeless tobacco: Never Used  . Alcohol Use: No   Family History  Problem Relation Age of Onset  . Cancer Father   . Colon cancer Father 74   No Known Allergies Current Outpatient Prescriptions on File Prior to Visit  Medication Sig Dispense Refill  . aspirin 81 MG tablet Take 81 mg by mouth daily.        . clotrimazole-betamethasone (LOTRISONE) cream APPLY TO AFFECTED AREA TWICE DAILY  45 g  0  . etodolac (LODINE) 400 MG tablet TAKE 1 TABLET BY MOUTH ONCE A DAY AS DIRECTED  30 tablet  0  . fluorouracil (EFUDEX) 5 % cream       . Omega-3 Fatty Acids (FISH OIL PO) Take 1 capsule by mouth once a week.      . Red Yeast Rice 600 MG TABS  Take 1 tablet by mouth daily.        No current facility-administered medications on file prior to visit.   The PMH, PSH, Social History, Family History, Medications, and allergies have been reviewed in [facility], and have been updated if relevant.  Review of Systems       See HPI No SI or HI Sometimes tearful but not often  Physical Exam BP 124/72  Pulse 58  Temp(Src) 97.8 F (36.6 C) (Oral)  Wt 146 lb 8 oz (66.452 kg)  SpO2 96%  General:  alert, well-developed, and well-nourished.   Head:  normocephalic and atraumatic.   Eyes:  vision grossly intact, pupils equal, pupils round, and pupils reactive to light.   Nose:  no external deformity.   Mouth:  good dentition.   Neck:  No deformities, masses, or tenderness noted. Lungs:  Normal respiratory effort, chest expands symmetrically. Lungs are clear to auscultation, no crackles or wheezes. Heart:  Normal rate and regular rhythm. S1 and S2 normal without gallop, murmur, click, rub or other extra sounds. Abdomen:  Bowel sounds positive,abdomen soft and non-tender without masses, organomegaly or hernias noted. Msk:  No deformity or scoliosis noted of thoracic or lumbar spine.   Extremities:  no edema  varicose veins bilaterally, nontender to palp Neurologic:  alert & oriented X3 and gait normal.   Skin:  Intact without suspicious lesions or rashes Psych:  Cognition and judgment appear intact. Alert and cooperative with normal attention span and concentration. No apparent delusions, illusions, hallucinations

## 2014-04-12 NOTE — Assessment & Plan Note (Signed)
Well controlled on current fx. If her appetite does not return and weight remains lower, we may need to lower her BP medications. No signs of hypotension currently.

## 2014-04-12 NOTE — Assessment & Plan Note (Signed)
New- good support system. She feels she is "doing as well as expected." She will continue to update me.

## 2014-04-12 NOTE — Patient Instructions (Signed)
Great to see you. I am so sorry for your loss. We will call you with your lab results.

## 2014-04-17 ENCOUNTER — Encounter: Payer: Self-pay | Admitting: *Deleted

## 2014-04-22 ENCOUNTER — Ambulatory Visit: Payer: Medicare Other | Admitting: Family Medicine

## 2014-04-22 ENCOUNTER — Telehealth: Payer: Self-pay | Admitting: Family Medicine

## 2014-04-22 NOTE — Telephone Encounter (Signed)
Pt calling to get lab results. Thank you

## 2014-04-22 NOTE — Telephone Encounter (Signed)
See result note.  

## 2014-04-22 NOTE — Telephone Encounter (Signed)
Lm on pts vm requesting a call back 

## 2014-05-03 ENCOUNTER — Other Ambulatory Visit: Payer: Self-pay | Admitting: *Deleted

## 2014-05-03 MED ORDER — ETODOLAC 400 MG PO TABS: ORAL_TABLET | ORAL | Status: DC

## 2014-05-08 DIAGNOSIS — Z803 Family history of malignant neoplasm of breast: Secondary | ICD-10-CM | POA: Diagnosis not present

## 2014-05-08 DIAGNOSIS — Z1231 Encounter for screening mammogram for malignant neoplasm of breast: Secondary | ICD-10-CM | POA: Diagnosis not present

## 2014-05-09 ENCOUNTER — Encounter: Payer: Self-pay | Admitting: Family Medicine

## 2014-06-07 ENCOUNTER — Other Ambulatory Visit: Payer: Self-pay | Admitting: Family Medicine

## 2014-06-07 DIAGNOSIS — E785 Hyperlipidemia, unspecified: Secondary | ICD-10-CM

## 2014-06-11 ENCOUNTER — Other Ambulatory Visit (INDEPENDENT_AMBULATORY_CARE_PROVIDER_SITE_OTHER): Payer: Medicare Other

## 2014-06-11 DIAGNOSIS — E785 Hyperlipidemia, unspecified: Secondary | ICD-10-CM | POA: Diagnosis not present

## 2014-06-11 LAB — COMPREHENSIVE METABOLIC PANEL
ALT: 12 U/L (ref 0–35)
AST: 23 U/L (ref 0–37)
Albumin: 4.3 g/dL (ref 3.5–5.2)
Alkaline Phosphatase: 56 U/L (ref 39–117)
BUN: 15 mg/dL (ref 6–23)
CALCIUM: 9.5 mg/dL (ref 8.4–10.5)
CHLORIDE: 103 meq/L (ref 96–112)
CO2: 31 mEq/L (ref 19–32)
CREATININE: 0.8 mg/dL (ref 0.4–1.2)
GFR: 71.8 mL/min (ref >60.00)
Glucose, Bld: 102 mg/dL — ABNORMAL HIGH (ref 70–99)
Potassium: 3.5 mEq/L (ref 3.5–5.1)
SODIUM: 139 meq/L (ref 135–145)
TOTAL PROTEIN: 7.1 g/dL (ref 6.0–8.3)
Total Bilirubin: 0.7 mg/dL (ref 0.2–1.2)

## 2014-06-11 LAB — LIPID PANEL
CHOL/HDL RATIO: 4
Cholesterol: 233 mg/dL — ABNORMAL HIGH (ref 0–200)
HDL: 58.6 mg/dL (ref >39.00)
LDL Cholesterol: 145 mg/dL — ABNORMAL HIGH (ref 0–99)
NONHDL: 174.4
Triglycerides: 145 mg/dL (ref 0.0–149.0)
VLDL: 29 mg/dL (ref 0.0–40.0)

## 2014-06-12 ENCOUNTER — Encounter: Payer: Self-pay | Admitting: *Deleted

## 2014-06-17 ENCOUNTER — Telehealth: Payer: Self-pay | Admitting: Family Medicine

## 2014-06-17 NOTE — Telephone Encounter (Signed)
error 

## 2014-06-24 ENCOUNTER — Ambulatory Visit: Payer: Medicare Other | Admitting: Internal Medicine

## 2014-07-08 ENCOUNTER — Other Ambulatory Visit: Payer: Self-pay | Admitting: *Deleted

## 2014-07-08 MED ORDER — TRIAMTERENE-HCTZ 37.5-25 MG PO TABS: ORAL_TABLET | ORAL | Status: DC

## 2014-07-08 MED ORDER — AMLODIPINE BESYLATE 5 MG PO TABS: ORAL_TABLET | ORAL | Status: DC

## 2014-09-06 ENCOUNTER — Other Ambulatory Visit: Payer: Self-pay | Admitting: Family Medicine

## 2014-10-16 DIAGNOSIS — M8580 Other specified disorders of bone density and structure, unspecified site: Secondary | ICD-10-CM | POA: Diagnosis not present

## 2014-10-22 ENCOUNTER — Encounter: Payer: Self-pay | Admitting: Family Medicine

## 2014-11-04 ENCOUNTER — Ambulatory Visit (INDEPENDENT_AMBULATORY_CARE_PROVIDER_SITE_OTHER): Payer: Medicare Other | Admitting: Family Medicine

## 2014-11-04 ENCOUNTER — Ambulatory Visit (INDEPENDENT_AMBULATORY_CARE_PROVIDER_SITE_OTHER)
Admission: RE | Admit: 2014-11-04 | Discharge: 2014-11-04 | Disposition: A | Discharge status: Home / Self Care | Payer: Medicare Other | Source: Ambulatory Visit | Attending: Family Medicine | Admitting: Family Medicine

## 2014-11-04 ENCOUNTER — Encounter: Payer: Self-pay | Admitting: Family Medicine

## 2014-11-04 VITALS — BP 122/64 | HR 75 | Temp 98.2°F | Ht 63.0 in | Wt 145.8 lb

## 2014-11-04 DIAGNOSIS — S322XXA Fracture of coccyx, initial encounter for closed fracture: Secondary | ICD-10-CM

## 2014-11-04 DIAGNOSIS — T148XXA Other injury of unspecified body region, initial encounter: Secondary | ICD-10-CM

## 2014-11-04 DIAGNOSIS — T148 Other injury of unspecified body region: Secondary | ICD-10-CM | POA: Diagnosis not present

## 2014-11-04 NOTE — Progress Notes (Signed)
Pre visit review using our clinic review tool, if applicable. No additional management support is needed unless otherwise documented below in the visit note. 

## 2014-11-04 NOTE — Progress Notes (Signed)
Dr. Frederico Hamman T. Taneesha Edgin, MD, [facility] [place], [address] Phone: [phone] Fax: [phone]  11/04/2014  Patient: Amy Barron, MRN: [id number], DOB: 1941/05/15, 74 y.o.  Primary Physician:  Arnette Norris, MD  Chief Complaint: Fall and Tailbone Pain  Subjective:   Amy Barron is a 74 y.o. very pleasant female patient who presents with the following:  Friday, was outside trying to clean some cobwebs, on porch, then let herself fall. Hit the concrete.  She fell directly on her tailbone and bottom, and is since had some significant pain in this region. She did not traumatize any other parts of her body.  Hit the back of head - no concussion symptoms.   Past Medical History, Surgical History, Social History, Family History, Problem List, Medications, and Allergies have been reviewed and updated if relevant.  GEN: No fevers, chills. Nontoxic. Primarily MSK c/o today. MSK: Detailed in the HPI GI: tolerating PO intake without difficulty Neuro: No numbness, parasthesias, or tingling associated. Otherwise the pertinent positives of the ROS are noted above.   Objective:   BP 122/64 mmHg  Pulse 75  Temp(Src) 98.2 F (36.8 C) (Oral)  Ht 5\' 3"  (1.6 m)  Wt 145 lb 12 oz (66.112 kg)  BMI 25.83 kg/m2   GEN: WDWN, NAD, Non-toxic, Alert & Oriented x 3 HEENT: Atraumatic, Normocephalic.  Ears and Nose: No external deformity. EXTR: No clubbing/cyanosis/edema NEURO: Normal gait.  PSYCH: Normally interactive. Conversant. Not depressed or anxious appearing.  Calm demeanor.    Lumbar spine is nontender, and she is directly tender at the coccyx. Sacrum is minimally tender. There is a very large contusion and hematoma on the right side of her buttocks. This portion of the physical examination was chaperoned by Hedy Camara, CMA.   Radiology: 3 view sacrum and coccyx Indication: pain Findings: On the lateral view, there  appears to be a nondisplaced proximal coccyx fracture. This is only seen on this view. A large amount stool in the colon obscures other views. I do not see a sacral fracture. Electronically Signed  By: Owens Loffler, MD On: 11/04/2014 10:49 AM   Assessment and Plan:   Fracture of coccyx, closed, initial encounter - Plan: DG Sacrum/Coccyx  Contusion  DOI 10/31/2009  Clinically and radiographically consistent with coccyx fracture. Reviewed conservative management, Tylenol for pain. Ice if needed. Restrict activities over the next month.  Follow-up: prn  New Prescriptions   No medications on file   Orders Placed This Encounter  Procedures  . DG Sacrum/Coccyx    Signed,  Maud Deed. Theo Reither, MD   Patient's Medications  New Prescriptions   No medications on file  Previous Medications   AMLODIPINE (NORVASC) 5 MG TABLET    TAKE 1 TABLET (5 MG TOTAL) BY MOUTH DAILY.   ASPIRIN 81 MG TABLET    Take 81 mg by mouth daily.     CALCIUM-MAGNESIUM-VITAMIN D (CALCIUM 1200+D3 PO)    Take by mouth.   CLOTRIMAZOLE-BETAMETHASONE (LOTRISONE) CREAM    APPLY TO AFFECTED AREA TWICE DAILY   ETODOLAC (LODINE) 400 MG TABLET    TAKE 1 TABLET BY MOUTH ONCE A DAY AS DIRECTED   OMEGA-3 FATTY ACIDS (FISH OIL PO)    Take 1 capsule by mouth once a week.   RED YEAST RICE 600 MG TABS    Take 1 tablet by mouth daily.    TRIAMTERENE-HYDROCHLOROTHIAZIDE (MAXZIDE-25) 37.5-25 MG PER TABLET    TAKE 1  TABLET BY MOUTH ONCE A DAY  Modified Medications   No medications on file  Discontinued Medications   FLUOROURACIL (EFUDEX) 5 % CREAM

## 2014-11-20 DIAGNOSIS — H35033 Hypertensive retinopathy, bilateral: Secondary | ICD-10-CM | POA: Diagnosis not present

## 2014-11-20 DIAGNOSIS — H3531 Nonexudative age-related macular degeneration: Secondary | ICD-10-CM | POA: Diagnosis not present

## 2014-11-20 DIAGNOSIS — H409 Unspecified glaucoma: Secondary | ICD-10-CM | POA: Diagnosis not present

## 2014-11-20 DIAGNOSIS — I708 Atherosclerosis of other arteries: Secondary | ICD-10-CM | POA: Diagnosis not present

## 2014-11-20 DIAGNOSIS — H402230 Chronic angle-closure glaucoma, bilateral, stage unspecified: Secondary | ICD-10-CM | POA: Diagnosis not present

## 2014-12-13 ENCOUNTER — Other Ambulatory Visit: Payer: Self-pay | Admitting: Family Medicine

## 2015-01-04 ENCOUNTER — Other Ambulatory Visit: Payer: Self-pay | Admitting: Family Medicine

## 2015-01-06 ENCOUNTER — Other Ambulatory Visit: Payer: Self-pay | Admitting: Family Medicine

## 2015-01-17 DIAGNOSIS — H409 Unspecified glaucoma: Secondary | ICD-10-CM | POA: Diagnosis not present

## 2015-01-17 DIAGNOSIS — H402230 Chronic angle-closure glaucoma, bilateral, stage unspecified: Secondary | ICD-10-CM | POA: Diagnosis not present

## 2015-02-05 ENCOUNTER — Encounter: Payer: Self-pay | Admitting: Internal Medicine

## 2015-03-20 ENCOUNTER — Other Ambulatory Visit: Payer: Self-pay | Admitting: Family Medicine

## 2015-04-01 ENCOUNTER — Other Ambulatory Visit (INDEPENDENT_AMBULATORY_CARE_PROVIDER_SITE_OTHER): Payer: Medicare Other

## 2015-04-01 ENCOUNTER — Other Ambulatory Visit: Payer: Self-pay | Admitting: Family Medicine

## 2015-04-01 DIAGNOSIS — E785 Hyperlipidemia, unspecified: Secondary | ICD-10-CM | POA: Diagnosis not present

## 2015-04-01 DIAGNOSIS — Z0000 Encounter for general adult medical examination without abnormal findings: Secondary | ICD-10-CM | POA: Diagnosis not present

## 2015-04-01 DIAGNOSIS — Z01419 Encounter for gynecological examination (general) (routine) without abnormal findings: Secondary | ICD-10-CM | POA: Insufficient documentation

## 2015-04-01 LAB — LIPID PANEL
Cholesterol: 214 mg/dL — ABNORMAL HIGH (ref 0–200)
HDL: 58.7 mg/dL (ref >39.00)
LDL CALC: 127 mg/dL — AB (ref 0–99)
NONHDL: 155.51
Total CHOL/HDL Ratio: 4
Triglycerides: 143 mg/dL (ref 0.0–149.0)
VLDL: 28.6 mg/dL (ref 0.0–40.0)

## 2015-04-01 LAB — CBC WITH DIFFERENTIAL/PLATELET
BASOS ABS: 0 10*3/uL (ref 0.0–0.1)
Basophils Relative: 0.8 % (ref 0.0–3.0)
EOS ABS: 0.1 10*3/uL (ref 0.0–0.7)
Eosinophils Relative: 2.7 % (ref 0.0–5.0)
HCT: 41.8 % (ref 36.0–46.0)
Hemoglobin: 14 g/dL (ref 12.0–15.0)
LYMPHS ABS: 1.2 10*3/uL (ref 0.7–4.0)
Lymphocytes Relative: 22.1 % (ref 12.0–46.0)
MCHC: 33.6 g/dL (ref 30.0–36.0)
MCV: 93.8 fl (ref 78.0–100.0)
Monocytes Absolute: 0.3 10*3/uL (ref 0.1–1.0)
Monocytes Relative: 6.4 % (ref 3.0–12.0)
NEUTROS ABS: 3.6 10*3/uL (ref 1.4–7.7)
Neutrophils Relative %: 68 % (ref 43.0–77.0)
PLATELETS: 275 10*3/uL (ref 150.0–400.0)
RBC: 4.45 Mil/uL (ref 3.87–5.11)
RDW: 14.1 % (ref 11.5–15.5)
WBC: 5.3 10*3/uL (ref 4.0–10.5)

## 2015-04-01 LAB — COMPREHENSIVE METABOLIC PANEL
ALT: 8 U/L (ref 0–35)
AST: 18 U/L (ref 0–37)
Albumin: 4.3 g/dL (ref 3.5–5.2)
Alkaline Phosphatase: 55 U/L (ref 39–117)
BILIRUBIN TOTAL: 0.7 mg/dL (ref 0.2–1.2)
BUN: 12 mg/dL (ref 6–23)
CHLORIDE: 104 meq/L (ref 96–112)
CO2: 33 mEq/L — ABNORMAL HIGH (ref 19–32)
Calcium: 9.4 mg/dL (ref 8.4–10.5)
Creatinine, Ser: 0.77 mg/dL (ref 0.40–1.20)
GFR: 78.11 mL/min (ref >60.00)
GLUCOSE: 101 mg/dL — AB (ref 70–99)
Potassium: 3.9 mEq/L (ref 3.5–5.1)
Sodium: 144 mEq/L (ref 135–145)
Total Protein: 6.5 g/dL (ref 6.0–8.3)

## 2015-04-01 LAB — TSH: TSH: 3.45 u[IU]/mL (ref 0.35–4.50)

## 2015-04-02 ENCOUNTER — Other Ambulatory Visit: Payer: Self-pay | Admitting: Family Medicine

## 2015-04-08 ENCOUNTER — Ambulatory Visit (INDEPENDENT_AMBULATORY_CARE_PROVIDER_SITE_OTHER): Payer: Medicare Other | Admitting: Family Medicine

## 2015-04-08 ENCOUNTER — Encounter: Payer: Self-pay | Admitting: Family Medicine

## 2015-04-08 VITALS — BP 128/70 | HR 63 | Temp 98.1°F | Ht 62.75 in | Wt 143.8 lb

## 2015-04-08 DIAGNOSIS — I10 Essential (primary) hypertension: Secondary | ICD-10-CM

## 2015-04-08 DIAGNOSIS — Z0000 Encounter for general adult medical examination without abnormal findings: Secondary | ICD-10-CM | POA: Diagnosis not present

## 2015-04-08 DIAGNOSIS — E785 Hyperlipidemia, unspecified: Secondary | ICD-10-CM | POA: Diagnosis not present

## 2015-04-08 DIAGNOSIS — J309 Allergic rhinitis, unspecified: Secondary | ICD-10-CM | POA: Diagnosis not present

## 2015-04-08 DIAGNOSIS — Z23 Encounter for immunization: Secondary | ICD-10-CM

## 2015-04-08 DIAGNOSIS — F4321 Adjustment disorder with depressed mood: Secondary | ICD-10-CM | POA: Diagnosis not present

## 2015-04-08 NOTE — Progress Notes (Signed)
Pre visit review using our clinic review tool, if applicable. No additional management support is needed unless otherwise documented below in the visit note. 

## 2015-04-08 NOTE — Assessment & Plan Note (Signed)
The patients weight, height, BMI and visual acuity have been recorded in the chart.  Cognitive function assessed.   I have made referrals, counseling and provided education to the patient based review of the above and I have provided the pt with a written personalized care plan for preventive services.  Influenza vaccine given today. 

## 2015-04-08 NOTE — Assessment & Plan Note (Signed)
Well controlled with red yeast rice.  No changes made today.

## 2015-04-08 NOTE — Assessment & Plan Note (Signed)
Well controlled on current rx. No changes made today. 

## 2015-04-08 NOTE — Patient Instructions (Signed)
It was great to see you. You look great and labs look fantastic.

## 2015-04-08 NOTE — Progress Notes (Signed)
Subjective:   Patient ID: Amy Barron, female    DOB: 04-Jan-1941, 74 y.o.   MRN: [id number]  Amy Barron is a pleasant 74 y.o. year old female who presents to clinic today with Annual Exam and Nasal Congestion  and follow up of chronic medical conditions on 04/08/2015  HPI: I have personally reviewed the Medicare Annual Wellness questionnaire and have noted 1. The patient's medical and social history 2. Their use of alcohol, tobacco or illicit drugs 3. Their current medications and supplements 4. The patient's functional ability including ADL's, fall risks, home safety risks and hearing or visual             impairment. 5. Diet and physical activities 6. Evidence for depression or mood disorders  End of life wishes discussed and updated in Social History.  The roster of all physicians providing medical care to patient - is listed in the [employer] section of the chart.  Pneumovax 04/20/11 Prevnar 13 04/12/14 Colonoscopy 2/15/14Henrene Pastor- 5 year recall Mammogram 05/08/14 DEXA 09/1214 Td 03/2009 Remote h/o hysterectomy  Fell in 10/2014- fractures her cocyx.  Feeling much better now.  Doing yoga twice a week.  Nasal congestion- allergies to pollen.  More runny nose.  No fevers, CP or SOB.  NO cough.  HTN- has stable on current medications. Has been on Triamtere/HCTZ 37/5/25 and Amlodipine 5 mg daily for years. Denies CP, SOB, LE edema,or blurred vision. Lab Results  Component Value Date   CREATININE 0.77 04/01/2015   HLD- well controlled don Red yeast rice.  Liver function WNL. Lab Results  Component Value Date   CHOL 214* 04/01/2015   HDL 58.70 04/01/2015   LDLCALC 127* 04/01/2015   LDLDIRECT 159.1 10/09/2010   TRIG 143.0 04/01/2015   CHOLHDL 4 04/01/2015   Lab Results  Component Value Date   TSH 3.45 04/01/2015   Lab Results  Component Value Date   ALT 8 04/01/2015   AST 18 04/01/2015   ALKPHOS 55 04/01/2015   BILITOT 0.7 04/01/2015   Lab Results    Component Value Date   NA 144 04/01/2015   K 3.9 04/01/2015   CL 104 04/01/2015   CO2 33* 04/01/2015   Current Outpatient Prescriptions on File Prior to Visit  Medication Sig Dispense Refill  . amLODipine (NORVASC) 5 MG tablet TAKE 1 TABLET (5 MG TOTAL) BY MOUTH DAILY. 30 tablet 0  . aspirin 81 MG tablet Take 81 mg by mouth daily.      . clotrimazole-betamethasone (LOTRISONE) cream APPLY TO AFFECTED AREA TWICE DAILY 45 g 0  . etodolac (LODINE) 400 MG tablet TAKE 1 TABLET BY MOUTH ONCE A DAY AS DIRECTED 30 tablet 0  . Red Yeast Rice 600 MG TABS Take 1 tablet by mouth daily.     Marland Kitchen triamterene-hydrochlorothiazide (MAXZIDE-25) 37.5-25 MG per tablet TAKE 1 TABLET BY MOUTH ONCE A DAY 30 tablet 0   No current facility-administered medications on file prior to visit.    No Known Allergies  Past Medical History  Diagnosis Date  . Hypertension   . Hyperlipidemia     Past Surgical History  Procedure Laterality Date  . Abdominal hysterectomy  1998  . Parathyroidectomy    . Orif wrist fracture  01/18/2012    Procedure: OPEN REDUCTION INTERNAL FIXATION (ORIF) WRIST FRACTURE;  Surgeon: Marybelle Killings, MD;  Location: [place];  Service: Orthopedics;  Laterality: Left;    Family History  Problem Relation Age of Onset  . Cancer Father   .  Colon cancer Father 50    Social History   Social History  . Marital Status: Married    Spouse Name: N/A  . Number of Children: N/A  . Years of Education: N/A   Occupational History  . Not on file.   Social History Main Topics  . Smoking status: Never Smoker   . Smokeless tobacco: Never Used  . Alcohol Use: No  . Drug Use: No  . Sexual Activity: No   Other Topics Concern  . Not on file   Social History Narrative   Desires CPR.   Would not want prolonged life support if futile   The PMH, PSH, Social History, Family History, Medications, and allergies have been reviewed in [facility], and have been updated if relevant.   Review of Systems   Constitutional: Negative.   HENT: Negative.   Eyes: Negative.   Respiratory: Negative.   Cardiovascular: Negative.   Gastrointestinal: Negative.   Endocrine: Negative.   Genitourinary: Negative.   Musculoskeletal: Negative.   Skin: Negative.   Allergic/Immunologic: Negative.   Neurological: Negative.   Hematological: Negative.   All other systems reviewed and are negative.      Objective:    BP 128/70 mmHg  Pulse 63  Temp(Src) 98.1 F (36.7 C) (Oral)  Ht 5' 2.75" (1.594 m)  Wt 143 lb 12 oz (65.205 kg)  BMI 25.66 kg/m2  SpO2 99%  Wt Readings from Last 3 Encounters:  04/08/15 143 lb 12 oz (65.205 kg)  11/04/14 145 lb 12 oz (66.112 kg)  04/12/14 146 lb 8 oz (66.452 kg)    Physical Exam  Constitutional: She is oriented to person, place, and time. She appears well-developed and well-nourished. No distress.  HENT:  Head: Normocephalic and atraumatic.  Eyes: Conjunctivae are normal.  Neck: Normal range of motion. Neck supple.  Cardiovascular: Normal rate, regular rhythm and normal heart sounds.   Pulmonary/Chest: Effort normal and breath sounds normal. No respiratory distress.  Abdominal: Soft.  Musculoskeletal: Normal range of motion. She exhibits no edema.  Neurological: She is alert and oriented to person, place, and time. No cranial nerve deficit.  Skin: Skin is warm and dry.  Psychiatric: She has a normal mood and affect. Her behavior is normal. Judgment normal.  Nursing note and vitals reviewed.         Assessment & Plan:   Grief  HLD (hyperlipidemia)  Essential hypertension  Medicare annual wellness visit, subsequent No Follow-up on file.

## 2015-04-22 ENCOUNTER — Other Ambulatory Visit: Payer: Self-pay | Admitting: Family Medicine

## 2015-05-05 ENCOUNTER — Other Ambulatory Visit: Payer: Self-pay | Admitting: Family Medicine

## 2015-05-20 DIAGNOSIS — Z803 Family history of malignant neoplasm of breast: Secondary | ICD-10-CM | POA: Diagnosis not present

## 2015-05-20 DIAGNOSIS — Z1231 Encounter for screening mammogram for malignant neoplasm of breast: Secondary | ICD-10-CM | POA: Diagnosis not present

## 2015-05-21 ENCOUNTER — Encounter: Payer: Self-pay | Admitting: Family Medicine

## 2015-06-13 ENCOUNTER — Telehealth: Payer: Self-pay | Admitting: Family Medicine

## 2015-06-13 NOTE — Telephone Encounter (Signed)
Agree UC or Saturday clinic

## 2015-06-13 NOTE — Telephone Encounter (Signed)
PLEASE NOTE: All timestamps contained within this report are represented as [place] Standard Time. CONFIDENTIALTY NOTICE: This fax transmission is intended only for the addressee. It contains information that is legally privileged, confidential or otherwise protected from use or disclosure. If you are not the intended recipient, you are strictly prohibited from reviewing, disclosing, copying using or disseminating any of this information or taking any action in reliance on or regarding this information. If you have received this fax in error, please notify us immediately by telephone so that we can arrange for its return to [place]. Phone: [phone], Toll-Free: [phone], Fax: [phone] Page: 1 of 2 Call Id: [id number] [place] Patient Name: Amy Barron Gender: Female DOB: 1941-04-07 Age: 74 Y 73 M 16 D Return Phone Number: [phone] (Primary) Address: City/State/Zip:  Client [facility] [facility] - Client Client Site [place] - Day Physician Arnette Norris Contact Type Call Call Type Triage / Clinical Relationship To Patient Self Appointment Disposition EMR Appointment Not Necessary Info pasted into Epic Yes Return Phone Number [phone] (Primary) Chief Complaint Urination Pain Initial Comment Caller states she was recently out of the country and is experiencing burning while urinating. PreDisposition Go to Urgent Care/Walk-In Clinic Nurse Assessment Nurse: Venetia Maxon, RN, Manuela Schwartz Date/Time (Eastern Time): 06/13/2015 3:54:17 PM Confirm and document reason for call. If symptomatic, describe symptoms. ---Caller states she was recently out of the country and is experiencing burning while urinating. She went to [place] 06/03/15 before this she had some symptoms like this and they went away. She has had UTI's over 20 yrs ago. Caller denies low back  pain. Last urine was an hour ago. She has some frequency. Current temp is AX 96.4 ( recently drank some cold liquid) Has the patient traveled out of the country within the last 30 days? ---Yes Where have you traveled? ([place] for Ebola and Ebola guideline, [place], [place] for [place]) ---[place] Does the patient have any new or worsening symptoms? ---Yes Will a triage be completed? ---Yes Related visit to physician within the last 2 weeks? ---No Does the PT have any chronic conditions? (i.e. diabetes, asthma, etc.) ---Yes List chronic conditions. ---HTN Is this a behavioral health call? ---No Guidelines Guideline Title Affirmed Question Affirmed Notes Nurse Date/Time Eilene Ghazi Time) Urination Pain - Female Age > 41 years Venetia Maxon, Charlean Sanfilippo 06/13/2015 3:58:48 PM Disp. Time Eilene Ghazi Time) Disposition Final User 06/13/2015 4:04:23 PM See Physician within 24 Hours Yes Venetia Maxon, RN, Manuela Schwartz PLEASE NOTE: All timestamps contained within this report are represented as [place] Standard Time. CONFIDENTIALTY NOTICE: This fax transmission is intended only for the addressee. It contains information that is legally privileged, confidential or otherwise protected from use or disclosure. If you are not the intended recipient, you are strictly prohibited from reviewing, disclosing, copying using or disseminating any of this information or taking any action in reliance on or regarding this information. If you have received this fax in error, please notify us immediately by telephone so that we can arrange for its return to [place]. Phone: [phone], Toll-Free: [phone], Fax: [phone] Page: 2 of 2 Call Id: [id number] [place] Understands: Yes Disagree/Comply: Comply Care Advice Given Per Guideline SEE PHYSICIAN WITHIN 24 HOURS: CARE ADVICE given per Urination Pain - Female (Adult) guideline. CALL BACK IF: * Fever or back pain occurs * You become worse. WARM SALINE SITZ BATHS TO REDUCE PAIN:  Sit in a warm saline  bath for 20 minutes to cleanse the area and to reduce pain. Add 2 oz. of table salt or baking soda to a tub of water. SEE PHYSICIAN WITHIN 24 HOURS: * IF OFFICE WILL BE CLOSED AND NO PCP TRIAGE: You need to be seen within the next 24 hours. An urgent care center is often a good source of care if your doctor's office is closed. FLUIDS: Drink extra fluids. Drink 8-10 glasses of liquids a day (Reason: to produce a dilute, non-irritating urine). * Increased fluid intake may be contraindicated in adults with renal failure or heart failure. CAUTION - FLUIDS: CARE ADVICE given per Urination Pain - Female (Adult) guideline. CALL BACK IF: * Fever or back pain occurs * You become worse. WARM SALINE SITZ BATHS TO REDUCE PAIN: Sit in a warm saline bath for 20 minutes to cleanse the area and to reduce pain. Add 2 oz. of table salt or baking soda to a tub of water. After Care Instructions Given Call Event Type User Date / Time Description Referrals [facility]

## 2015-06-13 NOTE — Telephone Encounter (Signed)
[facility]     Patient Name: Amy Barron Initial Comment Caller states she was recently out of the country and is experiencing burning while urinating.   DOB: [birth year]      Nurse Assessment  Nurse: Venetia Maxon, RN, Manuela Schwartz Date/Time (Eastern Time): 06/13/2015 3:54:17 PM  Confirm and document reason for call. If symptomatic, describe symptoms. ---Caller states she was recently out of the country and is experiencing burning while urinating. She went to [place] 06/03/15 before this she had some symptoms like this and they went away. She has had UTI's over 20 yrs ago. Caller denies low back pain. Last urine was an hour ago. She has some frequency. Current temp is AX 96.4 ( recently drank some cold liquid)  Has the patient traveled out of the country within the last 30 days? ---Yes  Where have you traveled? ([place] for Ebola and Ebola guideline, [place], [place] for [place]) ---[place]  Does the patient have any new or worsening symptoms? ---Yes  Will a triage be completed? ---Yes  Related visit to physician within the last 2 weeks? ---No  Does the PT have any chronic conditions? (i.e. diabetes, asthma, etc.) ---Yes  List chronic conditions. ---HTN  Is this a behavioral health call? ---No    Guidelines     Guideline Title Affirmed Question Affirmed Notes   Urination Pain - Female Age > 30 years    Final Disposition User   See Physician within [place], RN, Network engineer     Referrals   [facility]   Disagree/Comply: Comply

## 2015-07-01 ENCOUNTER — Encounter (HOSPITAL_COMMUNITY): Payer: Self-pay | Admitting: Emergency Medicine

## 2015-07-01 ENCOUNTER — Emergency Department (HOSPITAL_COMMUNITY)
Admission: EM | Admit: 2015-07-01 | Discharge: 2015-07-01 | Disposition: A | Discharge status: Home / Self Care | Payer: Medicare Other | Attending: Emergency Medicine | Admitting: Emergency Medicine

## 2015-07-01 ENCOUNTER — Emergency Department (HOSPITAL_COMMUNITY): Payer: Medicare Other

## 2015-07-01 DIAGNOSIS — I10 Essential (primary) hypertension: Secondary | ICD-10-CM | POA: Diagnosis not present

## 2015-07-01 DIAGNOSIS — Y9289 Other specified places as the place of occurrence of the external cause: Secondary | ICD-10-CM | POA: Diagnosis not present

## 2015-07-01 DIAGNOSIS — S42202A Unspecified fracture of upper end of left humerus, initial encounter for closed fracture: Secondary | ICD-10-CM | POA: Diagnosis not present

## 2015-07-01 DIAGNOSIS — Y9301 Activity, walking, marching and hiking: Secondary | ICD-10-CM | POA: Diagnosis not present

## 2015-07-01 DIAGNOSIS — Z7982 Long term (current) use of aspirin: Secondary | ICD-10-CM | POA: Insufficient documentation

## 2015-07-01 DIAGNOSIS — S4992XA Unspecified injury of left shoulder and upper arm, initial encounter: Secondary | ICD-10-CM | POA: Diagnosis present

## 2015-07-01 DIAGNOSIS — S8001XA Contusion of right knee, initial encounter: Secondary | ICD-10-CM | POA: Insufficient documentation

## 2015-07-01 DIAGNOSIS — Y998 Other external cause status: Secondary | ICD-10-CM | POA: Diagnosis not present

## 2015-07-01 DIAGNOSIS — S42302A Unspecified fracture of shaft of humerus, left arm, initial encounter for closed fracture: Secondary | ICD-10-CM

## 2015-07-01 DIAGNOSIS — S8992XA Unspecified injury of left lower leg, initial encounter: Secondary | ICD-10-CM | POA: Insufficient documentation

## 2015-07-01 DIAGNOSIS — W010XXA Fall on same level from slipping, tripping and stumbling without subsequent striking against object, initial encounter: Secondary | ICD-10-CM | POA: Diagnosis not present

## 2015-07-01 DIAGNOSIS — Z8639 Personal history of other endocrine, nutritional and metabolic disease: Secondary | ICD-10-CM | POA: Insufficient documentation

## 2015-07-01 DIAGNOSIS — S42292A Other displaced fracture of upper end of left humerus, initial encounter for closed fracture: Secondary | ICD-10-CM | POA: Diagnosis not present

## 2015-07-01 DIAGNOSIS — Z79899 Other long term (current) drug therapy: Secondary | ICD-10-CM | POA: Diagnosis not present

## 2015-07-01 MED ORDER — HYDROCODONE-ACETAMINOPHEN 5-325 MG PO TABS: 1.0000 | ORAL_TABLET | ORAL | Status: DC | PRN

## 2015-07-01 MED ORDER — ONDANSETRON 8 MG PO TBDP: 8.0000 mg | ORAL_TABLET | Freq: Three times a day (TID) | ORAL | Status: DC | PRN

## 2015-07-01 NOTE — Discharge Instructions (Signed)
Take ibuprofen or tylenol for pain. Take norco for severe pain as prescribed as needed. zofran for nausea. Ice and rest your arm. Do not use. Sling at all times. Follow up with orthopedics specialist as referred.    Humerus Fracture Treated With Immobilization The humerus is the large bone in your upper arm. You have a broken (fractured) humerus. These fractures are easily diagnosed with X-rays. TREATMENT  Simple fractures which will heal without disability are treated with simple immobilization. Immobilization means you will wear a cast, splint, or sling. You have a fracture which will do well with immobilization. The fracture will heal well simply by being held in a good position until it is stable enough to begin range of motion exercises. Do not take part in activities which would further injure your arm.  HOME CARE INSTRUCTIONS   Put ice on the injured area.  Put ice in a plastic bag.  Place a towel between your skin and the bag.  Leave the ice on for 15-20 minutes, 03-04 times a day.  If you have a cast:  Do not scratch the skin under the cast using sharp or pointed objects.  Check the skin around the cast every day. You may put lotion on any red or sore areas.  Keep your cast dry and clean.  If you have a splint:  Wear the splint as directed.  Keep your splint dry and clean.  You may loosen the elastic around the splint if your fingers become numb, tingle, or turn cold or blue.  If you have a sling:  Wear the sling as directed.  Do not put pressure on any part of your cast or splint until it is fully hardened.  Your cast or splint can be protected during bathing with a plastic bag. Do not lower the cast or splint into water.  Only take over-the-counter or prescription medicines for pain, discomfort, or fever as directed by your caregiver.  Do range of motion exercises as instructed by your caregiver.  Follow up as directed by your caregiver. This is very important  in order to avoid permanent injury or disability and chronic pain. SEEK IMMEDIATE MEDICAL CARE IF:   Your skin or nails in the injured arm turn blue or gray.  Your arm feels cold or numb.  You develop severe pain in the injured arm.  You are having problems with the medicines you were given. MAKE SURE YOU:   Understand these instructions.  Will watch your condition.  Will get help right away if you are not doing well or get worse.   This information is not intended to replace advice given to you by your health care provider. Make sure you discuss any questions you have with your health care provider.   Document Released: 10/18/2000 Document Revised: 08/02/2014 Document Reviewed: 12/04/2014 Elsevier Interactive Patient Education [employer].

## 2015-07-01 NOTE — ED Notes (Signed)
To ED via private vehicle with c/o fall, shoes were slippery from rain, landed on knees and left arm. C/o left shoulder pain. No deformity noted, no LOC>

## 2015-07-01 NOTE — ED Notes (Signed)
Patient transported to X-ray 

## 2015-07-01 NOTE — ED Provider Notes (Signed)
CSN: [id number]     Arrival date & time 07/01/15  1053 History  By signing my name below, I, Stephania Fragmin, attest that this documentation has been prepared under the direction and in the presence of Azusena Erlandson A Tamari Redwine, PA-C. Electronically Signed: Stephania Fragmin, ED Scribe. 07/01/2015. 1:21 PM.    Chief Complaint  Patient presents with  . Arm Pain  . Fall   The history is provided by the patient. No language interpreter was used.    HPI Comments: Amy Barron is a 74 y.o. female who presents to the Emergency Department S/P a fall that occurred about 2 hours ago, complaining of constant, 8/10 left arm pain since falling. She states her shoes were wet from the rain and she slipped while walking in a yoga studio, falling forward from standing height and landing on her bilateral knees. She denies LOC. She reports she also struck her lip and left arm. However, she reports no pain in her bilateral knees or lip, complaining only of pain in her left arm. Patient is able to ambulate without difficulty. She denies numbness, tingling, or any pain in her right arm.  Past Medical History  Diagnosis Date  . Hypertension   . Hyperlipidemia    Past Surgical History  Procedure Laterality Date  . Abdominal hysterectomy  1998  . Parathyroidectomy    . Orif wrist fracture  01/18/2012    Procedure: OPEN REDUCTION INTERNAL FIXATION (ORIF) WRIST FRACTURE;  Surgeon: Marybelle Killings, MD;  Location: [facility];  Service: Orthopedics;  Laterality: Left;   Family History  Problem Relation Age of Onset  . Cancer Father   . Colon cancer Father 67   Social History  Substance Use Topics  . Smoking status: Never Smoker   . Smokeless tobacco: Never Used  . Alcohol Use: No   OB History    No data available     Review of Systems  Musculoskeletal: Positive for myalgias (left arm pain) and arthralgias.  Neurological: Negative for numbness.   Allergies  Review of patient's allergies indicates no known allergies.  Home  Medications   Prior to Admission medications   Medication Sig Start Date End Date Taking? Authorizing Provider  amLODipine (NORVASC) 5 MG tablet TAKE 1 TABLET (5 MG TOTAL) BY MOUTH DAILY. 05/05/15   Lucille Passy, MD  aspirin 81 MG tablet Take 81 mg by mouth daily.      Historical Provider, MD  clotrimazole-betamethasone (LOTRISONE) cream APPLY TO AFFECTED AREA TWICE DAILY 07/09/13   Jearld Fenton, NP  etodolac (LODINE) 400 MG tablet TAKE 1 TABLET BY MOUTH ONCE A DAY AS DIRECTED 04/22/15   Lucille Passy, MD  Red Yeast Rice 600 MG TABS Take 1 tablet by mouth daily.     Historical Provider, MD  triamterene-hydrochlorothiazide (MAXZIDE-25) 37.5-25 MG tablet TAKE 1 TABLET BY MOUTH ONCE A DAY 05/05/15   Lucille Passy, MD   BP 165/72 mmHg  Pulse 79  Temp(Src) 97.8 F (36.6 C) (Oral)  Resp 20  SpO2 98% Physical Exam  Constitutional: She is oriented to person, place, and time. She appears well-developed and well-nourished. No distress.  HENT:  Head: Normocephalic and atraumatic.  Eyes: Conjunctivae and EOM are normal.  Neck: Neck supple. No tracheal deviation present.  Cardiovascular: Normal rate.   Pulmonary/Chest: Effort normal. No respiratory distress.  Musculoskeletal:  Contusion to right anterior knee. Both knees appeared with no obvious swelling. Full range of motion bilaterally. Negative anterior and posterior drawers bilaterally.  No laxity of medial lateral stress bilaterally. Normal hips and ankles. Dorsal pedal pulses intact. No obvious swelling or deformity noted to the left upper arm. No tenderness over left clavicle. Tenderness over anterior left shoulder. Unable to move due to pain. Sensation is intact over upper and lower arm. Normal elbow and wrist. Distal radial pulses intact.  Neurological: She is alert and oriented to person, place, and time.  Skin: Skin is warm and dry.  Psychiatric: She has a normal mood and affect. Her behavior is normal.  Nursing note and vitals  reviewed.   ED Course  Procedures (including critical care time)  DIAGNOSTIC STUDIES: Oxygen Saturation is 98% on RA, normal by my interpretation.    COORDINATION OF CARE: 12:11 PM - Discussed treatment plan with pt at bedside which includes left arm XR. Pt verbalized understanding and agreed to plan.   Imaging Review Dg Shoulder Left  07/01/2015  CLINICAL DATA:  Fall, shoulder pain EXAM: LEFT SHOULDER - 2+ VIEW COMPARISON:  None. FINDINGS: Three views of the left humerus submitted. There is displaced fracture of the left humeral neck. IMPRESSION: Displaced fracture of the left humeral neck. Electronically Signed   By: Lahoma Crocker M.D.   On: 07/01/2015 12:54   I have personally reviewed and evaluated these images and lab results as part of my medical decision-making.  MDM   Final diagnoses:  Humerus fracture, left, closed, initial encounter   Patient presents S/P slip and fall that occurred PTA. Patient X-Ray positive for displaced fracture of the left humeral neck. Pain managed in ED. Discussed with Dr. Vallery Ridge, ok for dc home with follow up. Pt advised to follow up with orthopedics. Patient given left shoulder immobilizer while in ED, conservative therapy recommended and discussed. Patient will be dc home & is agreeable with above plan.    Filed Vitals:   07/01/15 1142 07/01/15 1301  BP: 165/72 147/72  Pulse: 79 107  Temp: 97.8 F (36.6 C) 97.6 F (36.4 C)  TempSrc: Oral Oral  Resp: 20 18  SpO2: 98% 97%     Jeannett Senior, PA-C 07/01/15 [place], MD 07/09/15 [phone]

## 2015-07-04 DIAGNOSIS — S42252A Displaced fracture of greater tuberosity of left humerus, initial encounter for closed fracture: Secondary | ICD-10-CM | POA: Diagnosis not present

## 2015-07-04 DIAGNOSIS — S42292A Other displaced fracture of upper end of left humerus, initial encounter for closed fracture: Secondary | ICD-10-CM | POA: Diagnosis not present

## 2015-07-07 DIAGNOSIS — S42295A Other nondisplaced fracture of upper end of left humerus, initial encounter for closed fracture: Secondary | ICD-10-CM | POA: Diagnosis not present

## 2015-07-08 ENCOUNTER — Other Ambulatory Visit (HOSPITAL_COMMUNITY): Payer: Self-pay | Admitting: Orthopaedic Surgery

## 2015-07-08 ENCOUNTER — Ambulatory Visit: Payer: No Typology Code available for payment source | Admitting: Family Medicine

## 2015-07-10 ENCOUNTER — Encounter (HOSPITAL_COMMUNITY): Payer: Self-pay | Admitting: *Deleted

## 2015-07-10 NOTE — Progress Notes (Signed)
Pt denies SOB, chest pain, and being under the care of a cardiologist. Pt denies having a stress test, echo and cardiac cath. Pt denies having an EKG within the last year but stated that a chest x ray was done at PCP, Dr. Deborra Medina ; records requested. Pt made aware to stop taking Aspirin, otc vitamins and herbal medications such as Red Yeast Rice. Do not take any NSAIDs ie: Ibuprofen, Advil, Naproxen or any medication containing Aspirin such as Lodine. Pt verbalized understanding of all pre-op instructions.

## 2015-07-10 NOTE — H&P (Signed)
Amy Barron is an 74 y.o. female.   A 74 year old female is seen 3 days post fall when she slipped on a gym floor, suffering a left comminuted proximal humerus fracture.  This is a 3-part fracture.  She is wearing a sling.  She has been taking Phenergan for nausea and hydrocodone.  She has had a previous fall in 2013 and underwent ORIF of a distal radius fracture.  MEDICATIONS:  Include triamterene/hydrochlorothiazide 37.5/25 mg p.o. daily, amlodipine 5 mg daily, baby aspirin 1 a day and Lodine 400 mg p.o. b.i.d. p.r.n.   ALLERGIES:  NO KNOWN ALLERGIES.   PAST SURGICAL HISTORY:  Include hysterectomy in 1998 and wrist fracture in 2013.   FAMILY HISTORY:  Positive for diabetes.   SOCIAL HISTORY:  The patient is a widow.  Retired.  Does not smoke or drink.   REVIEW OF SYSTEMS:  Positive for hypertension.     Past Medical History  Diagnosis Date  . Hypertension   . Hyperlipidemia   . Stone, kidney   . Headache     Past Surgical History  Procedure Laterality Date  . Abdominal hysterectomy  1998  . Parathyroidectomy    . Orif wrist fracture  01/18/2012    Procedure: OPEN REDUCTION INTERNAL FIXATION (ORIF) WRIST FRACTURE;  Surgeon: Marybelle Killings, MD;  Location: [facility];  Service: Orthopedics;  Laterality: Left;  . Dilation and curettage of uterus    . Tubal ligation    . Colonoscopy w/ biopsies and polypectomy    . Multiple tooth extractions      Family History  Problem Relation Age of Onset  . Cancer Father   . Colon cancer Father 64  . Diabetes Other    Social History:  reports that she has never smoked. She has never used smokeless tobacco. She reports that she does not drink alcohol or use illicit drugs.  Allergies: No Known Allergies  No prescriptions prior to admission    No results found for this or any previous visit (from the past 48 hour(s)). No results found.  Review of Systems  Constitutional: Negative.   HENT: Negative.   Eyes: Negative.   Respiratory:  Negative.   Cardiovascular: Negative.   Gastrointestinal: Negative.   Genitourinary: Negative.   Musculoskeletal: Positive for joint pain.  Neurological: Negative.   Psychiatric/Behavioral: Negative.     There were no vitals taken for this visit. Physical Exam  Constitutional: She is oriented to person, place, and time. No distress.  HENT:  Head: Atraumatic.  Eyes: EOM are normal.  Neck: Normal range of motion.  Cardiovascular: Normal rate.   Respiratory: No respiratory distress.  GI: She exhibits no distension.  Musculoskeletal: She exhibits tenderness.  Neurological: She is alert and oriented to person, place, and time.  Skin: Skin is warm and dry.  Psychiatric: She has a normal mood and affect.    PHYSICAL EXAMINATION:  Height 5 feet 4 inches.  Weight 141 pounds.  She is alert and oriented.  WD, WN.  NAD.  Extraocular movements are intact.  Arm is in a sling.  Axillary sensation is intact.  Median, radial nerve and ulnar nerve intact.  Lungs are clear.  Heart regular rate and rhythm.  Abdomen is soft and nontender.  Pulses are normal.    RADIOGRAPHS:  New x-rays are obtained in the patient's sling, AP and axillary lateral.  She has 18 mm of lateral displacement of the head from the shaft.  Greater tuberosity is off.  ASSESSMENT: LEFT PROXIMAL HUMERUS FRACTURE  PLAN:  We discussed options. Will proceed with ORIF as discussed.   I discussed with her that she will not do well in her current position with angulation of 45 degrees.  Operative versus nonoperative treatment discussed.  Risks of bleeding, infection, nerve root damage, nonunion, loss of fixation of the greater tuberosity fragment, stiffness and decreased shoulder range of motion.  She lives alone and likes to be active.  She was concerned that she had been told by someone in the emergency room that this was not going to require surgery.  Will set this up for next week with an overnight stay.   Darilyn Storbeck M 07/10/2015,  4:26 PM

## 2015-07-11 ENCOUNTER — Inpatient Hospital Stay (HOSPITAL_COMMUNITY)
Admission: AD | Admit: 2015-07-11 | Discharge: 2015-07-13 | DRG: 494 | Disposition: A | Discharge status: Home / Self Care | Payer: Medicare Other | Source: Ambulatory Visit | Attending: Orthopaedic Surgery | Admitting: Orthopaedic Surgery

## 2015-07-11 ENCOUNTER — Encounter (HOSPITAL_COMMUNITY)
Admission: AD | Disposition: A | Discharge status: Home / Self Care | Payer: Self-pay | Source: Ambulatory Visit | Attending: Orthopaedic Surgery

## 2015-07-11 ENCOUNTER — Inpatient Hospital Stay (HOSPITAL_COMMUNITY): Payer: Medicare Other | Admitting: Anesthesiology

## 2015-07-11 ENCOUNTER — Inpatient Hospital Stay (HOSPITAL_COMMUNITY): Payer: Medicare Other

## 2015-07-11 ENCOUNTER — Encounter (HOSPITAL_COMMUNITY): Payer: Self-pay | Admitting: *Deleted

## 2015-07-11 DIAGNOSIS — W010XXA Fall on same level from slipping, tripping and stumbling without subsequent striking against object, initial encounter: Secondary | ICD-10-CM | POA: Diagnosis present

## 2015-07-11 DIAGNOSIS — Y939 Activity, unspecified: Secondary | ICD-10-CM | POA: Diagnosis not present

## 2015-07-11 DIAGNOSIS — G8918 Other acute postprocedural pain: Secondary | ICD-10-CM | POA: Diagnosis not present

## 2015-07-11 DIAGNOSIS — S42292A Other displaced fracture of upper end of left humerus, initial encounter for closed fracture: Secondary | ICD-10-CM | POA: Diagnosis not present

## 2015-07-11 DIAGNOSIS — S42209A Unspecified fracture of upper end of unspecified humerus, initial encounter for closed fracture: Secondary | ICD-10-CM | POA: Diagnosis present

## 2015-07-11 DIAGNOSIS — E785 Hyperlipidemia, unspecified: Secondary | ICD-10-CM | POA: Diagnosis not present

## 2015-07-11 DIAGNOSIS — S42202A Unspecified fracture of upper end of left humerus, initial encounter for closed fracture: Principal | ICD-10-CM | POA: Diagnosis present

## 2015-07-11 DIAGNOSIS — Z419 Encounter for procedure for purposes other than remedying health state, unspecified: Secondary | ICD-10-CM

## 2015-07-11 DIAGNOSIS — I10 Essential (primary) hypertension: Secondary | ICD-10-CM | POA: Diagnosis present

## 2015-07-11 DIAGNOSIS — Z01818 Encounter for other preprocedural examination: Secondary | ICD-10-CM | POA: Diagnosis not present

## 2015-07-11 DIAGNOSIS — Y9239 Other specified sports and athletic area as the place of occurrence of the external cause: Secondary | ICD-10-CM

## 2015-07-11 DIAGNOSIS — S42202B Unspecified fracture of upper end of left humerus, initial encounter for open fracture: Secondary | ICD-10-CM | POA: Diagnosis not present

## 2015-07-11 DIAGNOSIS — S42252A Displaced fracture of greater tuberosity of left humerus, initial encounter for closed fracture: Secondary | ICD-10-CM | POA: Diagnosis not present

## 2015-07-11 HISTORY — PX: ORIF HUMERUS FRACTURE: SHX2126

## 2015-07-11 LAB — COMPREHENSIVE METABOLIC PANEL
ALBUMIN: 3.8 g/dL (ref 3.5–5.0)
ALK PHOS: 96 U/L (ref 38–126)
ALT: 21 U/L (ref 14–54)
AST: 21 U/L (ref 15–41)
Anion gap: 10 (ref 5–15)
BUN: 9 mg/dL (ref 6–20)
CALCIUM: 9.5 mg/dL (ref 8.9–10.3)
CO2: 29 mmol/L (ref 22–32)
CREATININE: 0.77 mg/dL (ref 0.44–1.00)
Chloride: 101 mmol/L (ref 101–111)
GFR calc Af Amer: >60 mL/min (ref >60)
GFR calc non Af Amer: >60 mL/min (ref >60)
GLUCOSE: 106 mg/dL — AB (ref 65–99)
Potassium: 3.8 mmol/L (ref 3.5–5.1)
SODIUM: 140 mmol/L (ref 135–145)
Total Bilirubin: 0.7 mg/dL (ref 0.3–1.2)
Total Protein: 6.7 g/dL (ref 6.5–8.1)

## 2015-07-11 LAB — CBC
HEMATOCRIT: 39.7 % (ref 36.0–46.0)
HEMOGLOBIN: 13.1 g/dL (ref 12.0–15.0)
MCH: 31.8 pg (ref 26.0–34.0)
MCHC: 33 g/dL (ref 30.0–36.0)
MCV: 96.4 fL (ref 78.0–100.0)
Platelets: 294 10*3/uL (ref 150–400)
RBC: 4.12 MIL/uL (ref 3.87–5.11)
RDW: 14.1 % (ref 11.5–15.5)
WBC: 8.7 10*3/uL (ref 4.0–10.5)

## 2015-07-11 LAB — PROTIME-INR
INR: 1 (ref 0.00–1.49)
Prothrombin Time: 13.4 seconds (ref 11.6–15.2)

## 2015-07-11 SURGERY — OPEN REDUCTION INTERNAL FIXATION (ORIF) PROXIMAL HUMERUS FRACTURE
Anesthesia: General | Laterality: Left

## 2015-07-11 MED ORDER — HYDROCODONE-ACETAMINOPHEN 7.5-325 MG PO TABS
ORAL_TABLET | ORAL | Status: AC
  Administered 2015-07-11: 2
  Filled 2015-07-11: qty 2

## 2015-07-11 MED ORDER — PHENYLEPHRINE HCL 10 MG/ML IJ SOLN
10.0000 mg | INTRAVENOUS | Status: DC | PRN
  Administered 2015-07-11: 50 ug/min via INTRAVENOUS

## 2015-07-11 MED ORDER — MIDAZOLAM HCL 2 MG/2ML IJ SOLN
INTRAMUSCULAR | Status: AC
  Administered 2015-07-11: 2 mg
  Filled 2015-07-11: qty 2

## 2015-07-11 MED ORDER — ONDANSETRON HCL 4 MG/2ML IJ SOLN
INTRAMUSCULAR | Status: DC | PRN
  Administered 2015-07-11: 4 mg via INTRAVENOUS

## 2015-07-11 MED ORDER — MEPERIDINE HCL 25 MG/ML IJ SOLN: 6.2500 mg | INTRAMUSCULAR | Status: DC | PRN

## 2015-07-11 MED ORDER — TRIAMTERENE-HCTZ 37.5-25 MG PO TABS
1.0000 | ORAL_TABLET | Freq: Every day | ORAL | Status: DC
  Administered 2015-07-12 – 2015-07-13 (×2): 1 via ORAL
  Filled 2015-07-11 (×2): qty 1

## 2015-07-11 MED ORDER — FENTANYL CITRATE (PF) 100 MCG/2ML IJ SOLN
50.0000 ug | Freq: Once | INTRAMUSCULAR | Status: AC
  Administered 2015-07-11: 50 ug via INTRAVENOUS

## 2015-07-11 MED ORDER — PHENYLEPHRINE HCL 10 MG/ML IJ SOLN
INTRAMUSCULAR | Status: DC | PRN
  Administered 2015-07-11 (×3): 80 ug via INTRAVENOUS

## 2015-07-11 MED ORDER — METHOCARBAMOL 500 MG PO TABS
500.0000 mg | ORAL_TABLET | Freq: Four times a day (QID) | ORAL | Status: DC | PRN
  Administered 2015-07-11 (×2): 500 mg via ORAL
  Filled 2015-07-11: qty 1

## 2015-07-11 MED ORDER — FENTANYL CITRATE (PF) 100 MCG/2ML IJ SOLN
INTRAMUSCULAR | Status: AC
  Filled 2015-07-11: qty 2

## 2015-07-11 MED ORDER — SUGAMMADEX SODIUM 200 MG/2ML IV SOLN
INTRAVENOUS | Status: DC | PRN
  Administered 2015-07-11: 128 mg via INTRAVENOUS

## 2015-07-11 MED ORDER — PHENYLEPHRINE 40 MCG/ML (10ML) SYRINGE FOR IV PUSH (FOR BLOOD PRESSURE SUPPORT)
PREFILLED_SYRINGE | INTRAVENOUS | Status: AC
  Filled 2015-07-11: qty 10

## 2015-07-11 MED ORDER — 0.9 % SODIUM CHLORIDE (POUR BTL) OPTIME
TOPICAL | Status: DC | PRN
  Administered 2015-07-11: 1000 mL

## 2015-07-11 MED ORDER — FENTANYL CITRATE (PF) 100 MCG/2ML IJ SOLN
INTRAMUSCULAR | Status: DC | PRN
  Administered 2015-07-11: 50 ug via INTRAVENOUS

## 2015-07-11 MED ORDER — ONDANSETRON HCL 4 MG/2ML IJ SOLN
4.0000 mg | Freq: Four times a day (QID) | INTRAMUSCULAR | Status: DC | PRN
  Administered 2015-07-11: 4 mg via INTRAVENOUS
  Filled 2015-07-11: qty 2

## 2015-07-11 MED ORDER — PROPOFOL 10 MG/ML IV BOLUS
INTRAVENOUS | Status: AC
  Filled 2015-07-11: qty 20

## 2015-07-11 MED ORDER — ACETAMINOPHEN 325 MG PO TABS
650.0000 mg | ORAL_TABLET | Freq: Four times a day (QID) | ORAL | Status: DC | PRN
Start: 2015-07-11 — End: 2015-07-13
  Administered 2015-07-12: 650 mg via ORAL
  Filled 2015-07-11: qty 2

## 2015-07-11 MED ORDER — LACTATED RINGERS IV SOLN
INTRAVENOUS | Status: DC | PRN
  Administered 2015-07-11: 10:00:00 via INTRAVENOUS

## 2015-07-11 MED ORDER — METOCLOPRAMIDE HCL 5 MG/ML IJ SOLN: 5.0000 mg | Freq: Three times a day (TID) | INTRAMUSCULAR | Status: DC | PRN

## 2015-07-11 MED ORDER — BUPIVACAINE HCL (PF) 0.5 % IJ SOLN
INTRAMUSCULAR | Status: AC
  Filled 2015-07-11: qty 30

## 2015-07-11 MED ORDER — ACETAMINOPHEN 650 MG RE SUPP: 650.0000 mg | Freq: Four times a day (QID) | RECTAL | Status: DC | PRN

## 2015-07-11 MED ORDER — ONDANSETRON HCL 4 MG/2ML IJ SOLN: 4.0000 mg | Freq: Once | INTRAMUSCULAR | Status: DC | PRN

## 2015-07-11 MED ORDER — POTASSIUM CHLORIDE IN NACL 20-0.45 MEQ/L-% IV SOLN
INTRAVENOUS | Status: DC
  Administered 2015-07-11: 1000 mL via INTRAVENOUS
  Filled 2015-07-11 (×6): qty 1000

## 2015-07-11 MED ORDER — HYDROMORPHONE HCL 1 MG/ML IJ SOLN
0.5000 mg | INTRAMUSCULAR | Status: DC | PRN
  Administered 2015-07-11 – 2015-07-12 (×4): 0.5 mg via INTRAVENOUS
  Filled 2015-07-11 (×4): qty 1

## 2015-07-11 MED ORDER — ONDANSETRON HCL 4 MG PO TABS: 4.0000 mg | ORAL_TABLET | Freq: Four times a day (QID) | ORAL | Status: DC | PRN

## 2015-07-11 MED ORDER — MIDAZOLAM HCL 2 MG/2ML IJ SOLN: 1.0000 mg | Freq: Once | INTRAMUSCULAR | Status: DC

## 2015-07-11 MED ORDER — CEFAZOLIN SODIUM-DEXTROSE 2-3 GM-% IV SOLR
2.0000 g | Freq: Three times a day (TID) | INTRAVENOUS | Status: AC
  Administered 2015-07-11 – 2015-07-12 (×2): 2 g via INTRAVENOUS
  Filled 2015-07-11 (×2): qty 50

## 2015-07-11 MED ORDER — METHOCARBAMOL 500 MG PO TABS
ORAL_TABLET | ORAL | Status: AC
  Administered 2015-07-11: 500 mg via ORAL
  Filled 2015-07-11: qty 1

## 2015-07-11 MED ORDER — HYDROCODONE-ACETAMINOPHEN 10-325 MG PO TABS
1.0000 | ORAL_TABLET | ORAL | Status: DC | PRN
  Administered 2015-07-11: 1 via ORAL
  Administered 2015-07-12: 2 via ORAL
  Administered 2015-07-12: 1 via ORAL
  Administered 2015-07-13 (×2): 2 via ORAL
  Filled 2015-07-11 (×6): qty 2

## 2015-07-11 MED ORDER — AMLODIPINE BESYLATE 5 MG PO TABS
5.0000 mg | ORAL_TABLET | Freq: Every day | ORAL | Status: DC
  Administered 2015-07-12 – 2015-07-13 (×2): 5 mg via ORAL
  Filled 2015-07-11 (×2): qty 1

## 2015-07-11 MED ORDER — CHLORHEXIDINE GLUCONATE 4 % EX LIQD: 60.0000 mL | Freq: Once | CUTANEOUS | Status: DC

## 2015-07-11 MED ORDER — METOCLOPRAMIDE HCL 5 MG PO TABS: 5.0000 mg | ORAL_TABLET | Freq: Three times a day (TID) | ORAL | Status: DC | PRN

## 2015-07-11 MED ORDER — FENTANYL CITRATE (PF) 250 MCG/5ML IJ SOLN
INTRAMUSCULAR | Status: AC
  Filled 2015-07-11: qty 5

## 2015-07-11 MED ORDER — MENTHOL 3 MG MT LOZG: 1.0000 | LOZENGE | OROMUCOSAL | Status: DC | PRN

## 2015-07-11 MED ORDER — PHENOL 1.4 % MT LIQD: 1.0000 | OROMUCOSAL | Status: DC | PRN

## 2015-07-11 MED ORDER — GLYCOPYRROLATE 0.2 MG/ML IJ SOLN
INTRAMUSCULAR | Status: DC | PRN
  Administered 2015-07-11: 0.2 mg via INTRAVENOUS

## 2015-07-11 MED ORDER — LACTATED RINGERS IV SOLN
INTRAVENOUS | Status: DC
  Administered 2015-07-11: 08:00:00 via INTRAVENOUS

## 2015-07-11 MED ORDER — CEFAZOLIN SODIUM-DEXTROSE 2-3 GM-% IV SOLR
2.0000 g | INTRAVENOUS | Status: AC
  Administered 2015-07-11: 2 g via INTRAVENOUS
  Filled 2015-07-11: qty 50

## 2015-07-11 MED ORDER — METHOCARBAMOL 1000 MG/10ML IJ SOLN
500.0000 mg | Freq: Four times a day (QID) | INTRAVENOUS | Status: DC | PRN
  Filled 2015-07-11: qty 5

## 2015-07-11 MED ORDER — HYDROMORPHONE HCL 1 MG/ML IJ SOLN: 0.2500 mg | INTRAMUSCULAR | Status: DC | PRN

## 2015-07-11 MED ORDER — ONDANSETRON HCL 4 MG/2ML IJ SOLN
INTRAMUSCULAR | Status: AC
  Filled 2015-07-11: qty 2

## 2015-07-11 SURGICAL SUPPLY — 58 items
BENZOIN TINCTURE PRP APPL 2/3 (GAUZE/BANDAGES/DRESSINGS) ×3 IMPLANT
BIT DRILL 3.2 (BIT) ×2
BIT DRILL 3.2XCALB NS DISP (BIT) ×1 IMPLANT
BIT DRILL CALIBRATED 2.7 (BIT) ×2 IMPLANT
BIT DRILL CALIBRATED 2.7MM (BIT) ×1
BIT DRL 3.2XCALB NS DISP (BIT) ×1
CLOSURE WOUND 1/2 X4 (GAUZE/BANDAGES/DRESSINGS) ×1
COVER SURGICAL LIGHT HANDLE (MISCELLANEOUS) ×3 IMPLANT
DRAPE C-ARM 42X72 X-RAY (DRAPES) ×3 IMPLANT
DRAPE IMP U-DRAPE 54X76 (DRAPES) ×3 IMPLANT
DRAPE INCISE IOBAN 66X45 STRL (DRAPES) IMPLANT
DRAPE SURG 17X23 STRL (DRAPES) ×3 IMPLANT
DRAPE U-SHAPE 47X51 STRL (DRAPES) ×3 IMPLANT
DRSG EMULSION OIL 3X3 NADH (GAUZE/BANDAGES/DRESSINGS) ×3 IMPLANT
DRSG MEPILEX BORDER 4X12 (GAUZE/BANDAGES/DRESSINGS) ×3 IMPLANT
ELECT REM PT RETURN 9FT ADLT (ELECTROSURGICAL) ×3
ELECTRODE REM PT RTRN 9FT ADLT (ELECTROSURGICAL) ×1 IMPLANT
GAUZE SPONGE 4X4 12PLY STRL (GAUZE/BANDAGES/DRESSINGS) ×3 IMPLANT
GLOVE BIOGEL PI IND STRL 8 (GLOVE) ×2 IMPLANT
GLOVE BIOGEL PI INDICATOR 8 (GLOVE) ×4
GLOVE ORTHO TXT STRL SZ7.5 (GLOVE) ×6 IMPLANT
GOWN STRL REUS W/ TWL LRG LVL3 (GOWN DISPOSABLE) ×1 IMPLANT
GOWN STRL REUS W/ TWL XL LVL3 (GOWN DISPOSABLE) ×1 IMPLANT
GOWN STRL REUS W/TWL 2XL LVL3 (GOWN DISPOSABLE) ×3 IMPLANT
GOWN STRL REUS W/TWL LRG LVL3 (GOWN DISPOSABLE) ×2
GOWN STRL REUS W/TWL XL LVL3 (GOWN DISPOSABLE) ×2
K-WIRE 2X5 SS THRDED S3 (WIRE) ×6
KIT BASIN OR (CUSTOM PROCEDURE TRAY) ×3 IMPLANT
KIT ROOM TURNOVER OR (KITS) ×3 IMPLANT
KWIRE 2X5 SS THRDED S3 (WIRE) ×2 IMPLANT
MANIFOLD NEPTUNE II (INSTRUMENTS) ×3 IMPLANT
NEEDLE HYPO 25GX1X1/2 BEV (NEEDLE) IMPLANT
NS IRRIG 1000ML POUR BTL (IV SOLUTION) ×3 IMPLANT
PACK SHOULDER (CUSTOM PROCEDURE TRAY) ×3 IMPLANT
PACK UNIVERSAL I (CUSTOM PROCEDURE TRAY) ×3 IMPLANT
PAD ARMBOARD 7.5X6 YLW CONV (MISCELLANEOUS) ×6 IMPLANT
PEG LOCKING 3.2MMX26MM (Peg) ×6 IMPLANT
PEG LOCKING 3.2X32 (Peg) ×6 IMPLANT
PEG LOCKING 3.2X36 (Screw) ×3 IMPLANT
PEG LOCKING 3.2X38 (Screw) ×3 IMPLANT
PEG LOCKING 3.2X40 (Peg) ×3 IMPLANT
PLATE PROX HUMERUS HI LT 4H (Plate) ×3 IMPLANT
SCREW LP NL T15 3.5X24 (Screw) ×3 IMPLANT
SCREW LP NL T15 3.5X26 (Screw) ×6 IMPLANT
SCREW PEG LOCK 3.2X30MM (Screw) ×6 IMPLANT
SLEEVE MEASURING 3.2 (BIT) ×3 IMPLANT
SLING ARM IMMOBILIZER MED (SOFTGOODS) ×3 IMPLANT
SPONGE LAP 18X18 X RAY DECT (DISPOSABLE) ×6 IMPLANT
STAPLER VISISTAT 35W (STAPLE) ×3 IMPLANT
STRIP CLOSURE SKIN 1/2X4 (GAUZE/BANDAGES/DRESSINGS) ×2 IMPLANT
SUCTION FRAZIER TIP 10 FR DISP (SUCTIONS) ×3 IMPLANT
SUT FIBERWIRE #2 38 T-5 BLUE (SUTURE) ×6
SUT VIC AB 2-0 CT1 27 (SUTURE) ×4
SUT VIC AB 2-0 CT1 TAPERPNT 27 (SUTURE) ×2 IMPLANT
SUT VIC AB 3-0 FS2 27 (SUTURE) ×3 IMPLANT
SUTURE FIBERWR #2 38 T-5 BLUE (SUTURE) ×2 IMPLANT
SYR CONTROL 10ML LL (SYRINGE) IMPLANT
WATER STERILE IRR 1000ML POUR (IV SOLUTION) ×3 IMPLANT

## 2015-07-11 NOTE — Interval H&P Note (Signed)
History and Physical Interval Note:  07/11/2015 10:16 AM  Amy Barron  has presented today for surgery, with the diagnosis of Left Proximal Humerus Fracture  The various methods of treatment have been discussed with the patient and family. After consideration of risks, benefits and other options for treatment, the patient has consented to  Procedure(s): OPEN REDUCTION INTERNAL FIXATION (ORIF) PROXIMAL HUMERUS FRACTURE (Left) as a surgical intervention .  The patient's history has been reviewed, patient examined, no change in status, stable for surgery.  I have reviewed the patient's chart and labs.  Questions were answered to the patient's satisfaction.     Dorrine Montone C

## 2015-07-11 NOTE — Anesthesia Procedure Notes (Addendum)
Anesthesia Regional Block:  Interscalene brachial plexus block  Pre-Anesthetic Checklist: ,, timeout performed, Correct Patient, Correct Site, Correct Laterality, Correct Procedure, Correct Position, site marked, Risks and benefits discussed,  Surgical consent,  Pre-op evaluation,  At surgeon's request and post-op pain management  Laterality: Left  Prep: chloraprep       Needles:  Injection technique: Single-shot  Needle Type: Echogenic Stimulator Needle     Needle Length: 9cm 9 cm Needle Gauge: 21 and 21 G    Additional Needles:  Procedures: ultrasound guided (picture in chart) and nerve stimulator Interscalene brachial plexus block  Nerve Stimulator or Paresthesia:  Response: 0.4 mA,   Additional Responses:   Narrative:  Start time: 07/11/2015 9:45 AM End time: 07/11/2015 9:55 AM Injection made incrementally with aspirations every 5 mL.  Performed by: Personally  Anesthesiologist: Lillia Abed  Additional Notes: Monitors applied. Patient sedated. Sterile prep and drape,hand hygiene and sterile gloves were used. Relevant anatomy identified.Needle position confirmed.Local anesthetic injected incrementally after negative aspiration. Local anesthetic spread visualized around nerve(s). Vascular puncture avoided. No complications. Image printed for medical record.The patient tolerated the procedure well.        Procedure Name: Intubation Date/Time: 07/11/2015 10:47 AM Performed by: Manus Gunning, Talesha Ellithorpe J Pre-anesthesia Checklist: Patient identified, Emergency Drugs available, Suction available, Patient being monitored and Timeout performed Patient Re-evaluated:Patient Re-evaluated prior to inductionOxygen Delivery Method: Circle system utilized Preoxygenation: Pre-oxygenation with 100% oxygen Intubation Type: IV induction Ventilation: Mask ventilation without difficulty Laryngoscope Size: Mac and 3 Grade View: Grade I Tube type: Oral Tube size: 7.0 mm Number of attempts:  1 Placement Confirmation: ETT inserted through vocal cords under direct vision,  positive ETCO2 and breath sounds checked- equal and bilateral Secured at: 21 cm Tube secured with: Tape Dental Injury: Teeth and Oropharynx as per pre-operative assessment

## 2015-07-11 NOTE — Op Note (Signed)
NAMESTEPHAN, [name]               ACCOUNT NO.:  [id number]  MEDICAL RECORD NO.:  [id number]  LOCATION:  5M11C                        FACILITY:  [facility]  PHYSICIAN:  Lateshia Schmoker C. Lorin Mercy, M.D.    DATE OF BIRTH:  [birth year]  DATE OF PROCEDURE:  07/11/2015 DATE OF DISCHARGE:                              OPERATIVE REPORT   PREOPERATIVE DIAGNOSIS:  Left comminuted proximal humerus fracture.  POSTOPERATIVE DIAGNOSIS:  Left comminuted proximal humerus fracture.  PROCEDURE:  Open reduction and internal fixation of right proximal humerus fracture.  SURGEON:  Bianka Liberati C. Lorin Mercy, M.D.  ASSISTANT:  Alyson Locket. Ricard Dillon, PA-C, medically necessary and present for the entire procedure.  ANESTHESIA:  General.  ESTIMATED BLOOD LOSS:  Less than 100 mL.  DRAINS:  None.  IMPLANTS:  Biomet high-profile anatomic proximal humerus plate with pegs and screws.  DESCRIPTION OF PROCEDURE:  After induction of anesthesia, Ancef prophylaxis, standard prepping and draping, the patient was then shoulder frame, semi-beach-chair position.  Opposite right arm was on a pillow over a Mayo stand.  DuraPrep was used.  Split sheets, drapes, impervious stockinette, Coban, sterile skin marker for deltopectoral incision and Betadine Steri-Drapes still on the skin.  Time-out procedure completed.  Deltopectoral incision was made.  Cephalic vein was preserved, mobilized.  Transverse branch was coagulated.  Fracture was reduced with some difficulty.  Greater tuberosity was off, comminuted bone was extremely soft.  Fragments were reduced.  High- profile plate was selected since the tuberosity was off.  However, the fragments were too soft to hold with the screws.  They were pulled down to be underneath the edge of the plate and initially the plate was sucked down to the cortex with the nonlocking compression screw. Additional screws were placed.  Peg screws were filled proximally, checked with measurements.  Head was extremely soft.   Pegs frequently to be checked under fluoroscopy.  Since the head was so soft even with the depth gauge, we could not determine when had the articular cartilage. #2 FiberWire sutures were placed with the small blunt needle, placing sutures through the rotator cuff tendon, then attaching them to the proximal holes in the plate for additional securement.  Final spot pictures were taken.  Irrigation with saline solution.  All Peg holes had been filled and there were three distal bicortical screws placed.  The patient tolerated the procedure well.  After irrigation, 2-0 Vicryl for the subcutaneous tissue, skin staple closure, postop dressing, and a postoperative sling.  Instrument count and needle counts were correct.     [name] C. Lorin Mercy, M.D.     MCY/MEDQ  D:  07/11/2015  T:  07/11/2015  Job:  [id number]

## 2015-07-11 NOTE — Transfer of Care (Signed)
Immediate Anesthesia Transfer of Care Note  Patient: Amy Barron  Procedure(s) Performed: Procedure(s): OPEN REDUCTION INTERNAL FIXATION (ORIF) PROXIMAL HUMERUS FRACTURE (Left)  Patient Location: PACU  Anesthesia Type:General  Level of Consciousness: awake  Airway & Oxygen Therapy: Patient Spontanous Breathing and Patient connected to nasal cannula oxygen  Post-op Assessment: Report given to RN and Post -op Vital signs reviewed and stable  Post vital signs: Reviewed and stable  Last Vitals:  Filed Vitals:   07/11/15 1027 07/11/15 1237  BP:  123/62  Pulse: 89 82  Temp:    Resp: 20 16    Complications: No apparent anesthesia complications

## 2015-07-11 NOTE — Progress Notes (Signed)
Pt was not able to obtain urine speci states she just went when she got here.

## 2015-07-11 NOTE — Anesthesia Postprocedure Evaluation (Signed)
Anesthesia Post Note  Patient: Amy Barron  Procedure(s) Performed: Procedure(s) (LRB): OPEN REDUCTION INTERNAL FIXATION (ORIF) PROXIMAL HUMERUS FRACTURE (Left)  Patient location during evaluation: PACU Anesthesia Type: General Level of consciousness: awake and alert Pain management: pain level controlled Vital Signs Assessment: post-procedure vital signs reviewed and stable Respiratory status: spontaneous breathing, nonlabored ventilation, respiratory function stable and patient connected to nasal cannula oxygen Cardiovascular status: blood pressure returned to baseline and stable Postop Assessment: no signs of nausea or vomiting Anesthetic complications: no    Last Vitals:  Filed Vitals:   07/11/15 1330 07/11/15 1430  BP: 131/62 123/68  Pulse: 78 77  Temp:    Resp: 16 16    Last Pain:  Filed Vitals:   07/11/15 1442  PainSc: 0-No pain                 Courtnie Brenes DAVID

## 2015-07-11 NOTE — Anesthesia Preprocedure Evaluation (Signed)
Anesthesia Evaluation  Patient identified by MRN, date of birth, ID band Patient awake    Reviewed: Allergy & Precautions, NPO status , Patient's Chart, lab work & pertinent test results  Airway Mallampati: I  TM Distance: >3 FB Neck ROM: Full    Dental   Pulmonary    Pulmonary exam normal        Cardiovascular hypertension, Pt. on medications Normal cardiovascular exam     Neuro/Psych    GI/Hepatic   Endo/Other    Renal/GU      Musculoskeletal   Abdominal   Peds  Hematology   Anesthesia Other Findings   Reproductive/Obstetrics                             Anesthesia Physical Anesthesia Plan  ASA: II  Anesthesia Plan: General   Post-op Pain Management: MAC Combined w/ Regional for Post-op pain   Induction: Intravenous  Airway Management Planned: Oral ETT  Additional Equipment:   Intra-op Plan:   Post-operative Plan: Extubation in OR  Informed Consent: I have reviewed the patients History and Physical, chart, labs and discussed the procedure including the risks, benefits and alternatives for the proposed anesthesia with the patient or authorized representative who has indicated his/her understanding and acceptance.     Plan Discussed with: CRNA and Surgeon  Anesthesia Plan Comments:         Anesthesia Quick Evaluation

## 2015-07-11 NOTE — Progress Notes (Signed)
Pt states she wants to put her teeth back in as soon as possible after surgery. Partial dentures placed in dentures cup and placed at bedside.

## 2015-07-11 NOTE — Progress Notes (Signed)
Patient up from PACU alert and stable with some pain from surgical sight will continue to monitor.

## 2015-07-11 NOTE — Progress Notes (Signed)
Lab called and states blood has hemolyzed and will need to be re collected. New order placed for cmet and PT. Our lab called and informed.

## 2015-07-11 NOTE — Brief Op Note (Signed)
07/11/2015  1:20 PM  PATIENT:  Amy Barron  74 y.o. female  PRE-OPERATIVE DIAGNOSIS:  Left Proximal Humerus Fracture  POST-OPERATIVE DIAGNOSIS:  Left Proximal Humerus Fracture  PROCEDURE:  Procedure(s): OPEN REDUCTION INTERNAL FIXATION (ORIF) PROXIMAL HUMERUS FRACTURE (Left)  SURGEON:  Surgeon(s) and Role:    * Marybelle Killings, MD - Primary  PHYSICIAN ASSISTANT: Benjiman Core   ANESTHESIA:   general  EBL:  Total I/O In: 1000 [I.V.:1000] Out: 25 [Blood:25]  BLOOD ADMINISTERED:none  DRAINS: none   LOCAL MEDICATIONS USED:  NONE  SPECIMEN:  No Specimen  DISPOSITION OF SPECIMEN:  N/A  COUNTS:  YES  TOURNIQUET:  * No tourniquets in log *  DICTATION: .Dragon Dictation  PLAN OF CARE: Admit to inpatient   PATIENT DISPOSITION:  PACU - hemodynamically stable.

## 2015-07-12 LAB — GLUCOSE, CAPILLARY: Glucose-Capillary: 140 mg/dL — ABNORMAL HIGH (ref 65–99)

## 2015-07-12 NOTE — Progress Notes (Signed)
Subjective: 1 Day Post-Op Procedure(s) (LRB): OPEN REDUCTION INTERNAL FIXATION (ORIF) PROXIMAL HUMERUS FRACTURE (Left) Patient reports pain as 7 on 0-10 scale.    Objective: Vital signs in last 24 hours: Temp:  [97.3 F (36.3 C)-100 F (37.8 C)] 97.3 F (36.3 C) (12/17 1013) Pulse Rate:  [67-100] 81 (12/17 1013) Resp:  [11-19] 19 (12/17 1013) BP: (102-145)/(50-74) 111/62 mmHg (12/17 1013) SpO2:  [93 %-100 %] 100 % (12/17 1013)  Intake/Output from previous day: 12/16 0701 - 12/17 0700 In: 1000 [I.V.:1000] Out: 25 [Blood:25] Intake/Output this shift:     Recent Labs  07/11/15 0758  HGB 13.1    Recent Labs  07/11/15 0758  WBC 8.7  RBC 4.12  HCT 39.7  PLT 294    Recent Labs  07/11/15 0830  NA 140  K 3.8  CL 101  CO2 29  BUN 9  CREATININE 0.77  GLUCOSE 106*  CALCIUM 9.5    Recent Labs  07/11/15 0830  INR 1.00    Neurologically intact  Block wore off.   Assessment/Plan: 1 Day Post-Op Procedure(s) (LRB): OPEN REDUCTION INTERNAL FIXATION (ORIF) PROXIMAL HUMERUS FRACTURE (Left) Up with therapy  Home Sunday  Amy Barron C 07/12/2015, 12:15 PM

## 2015-07-13 MED ORDER — HYDROCODONE-ACETAMINOPHEN 10-325 MG PO TABS: 1.0000 | ORAL_TABLET | ORAL | Status: DC | PRN

## 2015-07-13 NOTE — Progress Notes (Signed)
Subjective: 2 Days Post-Op Procedure(s) (LRB): OPEN REDUCTION INTERNAL FIXATION (ORIF) PROXIMAL HUMERUS FRACTURE (Left) Patient reports pain as mild.    Objective: Vital signs in last 24 hours: Temp:  [97.8 F (36.6 C)-101.3 F (38.5 C)] 98 F (36.7 C) (12/18 0548) Pulse Rate:  [68-98] 71 (12/18 0548) Resp:  [18] 18 (12/18 0548) BP: (100-148)/(50-64) 122/50 mmHg (12/18 0548) SpO2:  [84 %-100 %] 100 % (12/18 0548)  Intake/Output from previous day:   Intake/Output this shift:     Recent Labs  07/11/15 0758  HGB 13.1    Recent Labs  07/11/15 0758  WBC 8.7  RBC 4.12  HCT 39.7  PLT 294    Recent Labs  07/11/15 0830  NA 140  K 3.8  CL 101  CO2 29  BUN 9  CREATININE 0.77  GLUCOSE 106*  CALCIUM 9.5    Recent Labs  07/11/15 0830  INR 1.00    Neurologically intact  Assessment/Plan: 2 Days Post-Op Procedure(s) (LRB): OPEN REDUCTION INTERNAL FIXATION (ORIF) PROXIMAL HUMERUS FRACTURE (Left) Plan:  Discharge Home  YATES,MARK C 07/13/2015, 10:34 AM

## 2015-07-13 NOTE — Discharge Instructions (Signed)
Keep sling on. Ok to work on finger and wrist range of motion. May shower with sling off and arm kept across the chest. After shower reapply sling.

## 2015-07-14 ENCOUNTER — Encounter (HOSPITAL_COMMUNITY): Payer: Self-pay | Admitting: Orthopaedic Surgery

## 2015-07-16 DIAGNOSIS — S42252D Displaced fracture of greater tuberosity of left humerus, subsequent encounter for fracture with routine healing: Secondary | ICD-10-CM | POA: Diagnosis not present

## 2015-07-24 DIAGNOSIS — S42252D Displaced fracture of greater tuberosity of left humerus, subsequent encounter for fracture with routine healing: Secondary | ICD-10-CM | POA: Diagnosis not present

## 2015-07-30 ENCOUNTER — Emergency Department (HOSPITAL_COMMUNITY)
Admission: EM | Admit: 2015-07-30 | Discharge: 2015-07-30 | Disposition: A | Discharge status: Home / Self Care | Payer: Medicare Other | Attending: Emergency Medicine | Admitting: Emergency Medicine

## 2015-07-30 ENCOUNTER — Emergency Department (HOSPITAL_COMMUNITY): Payer: Medicare Other

## 2015-07-30 ENCOUNTER — Encounter (HOSPITAL_COMMUNITY): Payer: Self-pay | Admitting: Emergency Medicine

## 2015-07-30 DIAGNOSIS — Z87442 Personal history of urinary calculi: Secondary | ICD-10-CM | POA: Diagnosis not present

## 2015-07-30 DIAGNOSIS — Z7982 Long term (current) use of aspirin: Secondary | ICD-10-CM | POA: Diagnosis not present

## 2015-07-30 DIAGNOSIS — I10 Essential (primary) hypertension: Secondary | ICD-10-CM | POA: Diagnosis not present

## 2015-07-30 DIAGNOSIS — Z79899 Other long term (current) drug therapy: Secondary | ICD-10-CM | POA: Diagnosis not present

## 2015-07-30 DIAGNOSIS — Z8639 Personal history of other endocrine, nutritional and metabolic disease: Secondary | ICD-10-CM | POA: Insufficient documentation

## 2015-07-30 DIAGNOSIS — R110 Nausea: Secondary | ICD-10-CM | POA: Insufficient documentation

## 2015-07-30 DIAGNOSIS — R9431 Abnormal electrocardiogram [ECG] [EKG]: Secondary | ICD-10-CM | POA: Diagnosis not present

## 2015-07-30 DIAGNOSIS — R630 Anorexia: Secondary | ICD-10-CM | POA: Insufficient documentation

## 2015-07-30 LAB — COMPREHENSIVE METABOLIC PANEL
ALT: 8 U/L — AB (ref 14–54)
ANION GAP: 10 (ref 5–15)
AST: 17 U/L (ref 15–41)
Albumin: 4.1 g/dL (ref 3.5–5.0)
Alkaline Phosphatase: 103 U/L (ref 38–126)
BUN: 9 mg/dL (ref 6–20)
CALCIUM: 9.8 mg/dL (ref 8.9–10.3)
CHLORIDE: 103 mmol/L (ref 101–111)
CO2: 28 mmol/L (ref 22–32)
CREATININE: 0.83 mg/dL (ref 0.44–1.00)
Glucose, Bld: 103 mg/dL — ABNORMAL HIGH (ref 65–99)
Potassium: 3.6 mmol/L (ref 3.5–5.1)
SODIUM: 141 mmol/L (ref 135–145)
Total Bilirubin: 0.4 mg/dL (ref 0.3–1.2)
Total Protein: 7.2 g/dL (ref 6.5–8.1)

## 2015-07-30 LAB — CBC WITH DIFFERENTIAL/PLATELET
Basophils Absolute: 0 10*3/uL (ref 0.0–0.1)
Basophils Relative: 0 %
EOS ABS: 0.1 10*3/uL (ref 0.0–0.7)
EOS PCT: 1 %
HCT: 39.7 % (ref 36.0–46.0)
Hemoglobin: 13.2 g/dL (ref 12.0–15.0)
LYMPHS ABS: 1.3 10*3/uL (ref 0.7–4.0)
LYMPHS PCT: 16 %
MCH: 31.9 pg (ref 26.0–34.0)
MCHC: 33.2 g/dL (ref 30.0–36.0)
MCV: 95.9 fL (ref 78.0–100.0)
MONO ABS: 0.5 10*3/uL (ref 0.1–1.0)
MONOS PCT: 6 %
Neutro Abs: 6.6 10*3/uL (ref 1.7–7.7)
Neutrophils Relative %: 77 %
Platelets: 319 10*3/uL (ref 150–400)
RBC: 4.14 MIL/uL (ref 3.87–5.11)
RDW: 14.3 % (ref 11.5–15.5)
WBC: 8.5 10*3/uL (ref 4.0–10.5)

## 2015-07-30 LAB — I-STAT TROPONIN, ED: TROPONIN I, POC: 0 ng/mL (ref 0.00–0.08)

## 2015-07-30 LAB — LIPASE, BLOOD: LIPASE: 21 U/L (ref 11–51)

## 2015-07-30 MED ORDER — SODIUM CHLORIDE 0.9 % IV SOLN
Freq: Once | INTRAVENOUS | Status: AC
Start: 2015-07-30 — End: 2015-07-30
  Administered 2015-07-30: 10:00:00 via INTRAVENOUS

## 2015-07-30 MED ORDER — SODIUM CHLORIDE 0.9 % IV BOLUS (SEPSIS)
500.0000 mL | Freq: Once | INTRAVENOUS | Status: AC
  Administered 2015-07-30: 500 mL via INTRAVENOUS

## 2015-07-30 MED ORDER — ONDANSETRON HCL 4 MG/2ML IJ SOLN
4.0000 mg | Freq: Once | INTRAMUSCULAR | Status: AC
  Administered 2015-07-30: 4 mg via INTRAVENOUS
  Filled 2015-07-30: qty 2

## 2015-07-30 MED ORDER — OMEPRAZOLE 20 MG PO CPDR: 20.0000 mg | DELAYED_RELEASE_CAPSULE | Freq: Two times a day (BID) | ORAL | Status: DC

## 2015-07-30 MED ORDER — GI COCKTAIL ~~LOC~~
30.0000 mL | Freq: Once | ORAL | Status: AC
  Administered 2015-07-30: 30 mL via ORAL
  Filled 2015-07-30: qty 30

## 2015-07-30 NOTE — ED Notes (Signed)
Pt states she has been feeling nauseated for the past 4 days. She has seen her primary MD for this and received zofran ODT. States this helps occasionally but is still having the daily nausea.

## 2015-07-30 NOTE — Discharge Instructions (Signed)
You were seen today for your nausea. At this time it appears that this may be related to with Scott acid reflux or possibly a small ulcer in her stomach. Take the medication prescribed to help control your symptoms and help to allow things to heal. Follow-up with the stomach doctor gastroenterologist as directed. You can also follow-up with her primary care physician to help facilitate a referral to the stomach physician if needed.  Gastroesophageal Reflux Disease, Adult Normally, food travels down the esophagus and stays in the stomach to be digested. However, when a person has gastroesophageal reflux disease (GERD), food and stomach acid move back up into the esophagus. When this happens, the esophagus becomes sore and inflamed. Over time, GERD can create small holes (ulcers) in the lining of the esophagus.  CAUSES This condition is caused by a problem with the muscle between the esophagus and the stomach (lower esophageal sphincter, or LES). Normally, the LES muscle closes after food passes through the esophagus to the stomach. When the LES is weakened or abnormal, it does not close properly, and that allows food and stomach acid to go back up into the esophagus. The LES can be weakened by certain dietary substances, medicines, and medical conditions, including:  Tobacco use.  Pregnancy.  Having a hiatal hernia.  Heavy alcohol use.  Certain foods and beverages, such as coffee, chocolate, onions, and peppermint. RISK FACTORS This condition is more likely to develop in:  People who have an increased body weight.  People who have connective tissue disorders.  People who use NSAID medicines. SYMPTOMS Symptoms of this condition include:  Heartburn.  Difficult or painful swallowing.  The feeling of having a lump in the throat.  Abitter taste in the mouth.  Bad breath.  Having a large amount of saliva.  Having an upset or bloated stomach.  Belching.  Chest pain.  Shortness  of breath or wheezing.  Ongoing (chronic) cough or a night-time cough.  Wearing away of tooth enamel.  Weight loss. Different conditions can cause chest pain. Make sure to see your health care provider if you experience chest pain. DIAGNOSIS Your health care provider will take a medical history and perform a physical exam. To determine if you have mild or severe GERD, your health care provider may also monitor how you respond to treatment. You may also have other tests, including:  An endoscopy toexamine your stomach and esophagus with a small camera.  A test thatmeasures the acidity level in your esophagus.  A test thatmeasures how much pressure is on your esophagus.  A barium swallow or modified barium swallow to show the shape, size, and functioning of your esophagus. TREATMENT The goal of treatment is to help relieve your symptoms and to prevent complications. Treatment for this condition may vary depending on how severe your symptoms are. Your health care provider may recommend:  Changes to your diet.  Medicine.  Surgery. HOME CARE INSTRUCTIONS Diet  Follow a diet as recommended by your health care provider. This may involve avoiding foods and drinks such as:  Coffee and tea (with or without caffeine).  Drinks that containalcohol.  Energy drinks and sports drinks.  Carbonated drinks or sodas.  Chocolate and cocoa.  Peppermint and mint flavorings.  Garlic and onions.  Horseradish.  Spicy and acidic foods, including peppers, chili powder, curry powder, vinegar, hot sauces, and barbecue sauce.  Citrus fruit juices and citrus fruits, such as oranges, lemons, and limes.  Tomato-based foods, such as red sauce, chili,  salsa, and pizza with red sauce.  Fried and fatty foods, such as donuts, french fries, potato chips, and high-fat dressings.  High-fat meats, such as hot dogs and fatty cuts of red and white meats, such as rib eye steak, sausage, ham, and  bacon.  High-fat dairy items, such as whole milk, butter, and cream cheese.  Eat small, frequent meals instead of large meals.  Avoid drinking large amounts of liquid with your meals.  Avoid eating meals during the 2-3 hours before bedtime.  Avoid lying down right after you eat.  Do not exercise right after you eat. General Instructions  Pay attention to any changes in your symptoms.  Take over-the-counter and prescription medicines only as told by your health care provider. Do not take aspirin, ibuprofen, or other NSAIDs unless your health care provider told you to do so.  Do not use any tobacco products, including cigarettes, chewing tobacco, and e-cigarettes. If you need help quitting, ask your health care provider.  Wear loose-fitting clothing. Do not wear anything tight around your waist that causes pressure on your abdomen.  Raise (elevate) the head of your bed 6 inches (15cm).  Try to reduce your stress, such as with yoga or meditation. If you need help reducing stress, ask your health care provider.  If you are overweight, reduce your weight to an amount that is healthy for you. Ask your health care provider for guidance about a safe weight loss goal.  Keep all follow-up visits as told by your health care provider. This is important. SEEK MEDICAL CARE IF:  You have new symptoms.  You have unexplained weight loss.  You have difficulty swallowing, or it hurts to swallow.  You have wheezing or a persistent cough.  Your symptoms do not improve with treatment.  You have a hoarse voice. SEEK IMMEDIATE MEDICAL CARE IF:  You have pain in your arms, neck, jaw, teeth, or back.  You feel sweaty, dizzy, or light-headed.  You have chest pain or shortness of breath.  You vomit and your vomit looks like blood or coffee grounds.  You faint.  Your stool is bloody or black.  You cannot swallow, drink, or eat.   This information is not intended to replace advice  given to you by your health care provider. Make sure you discuss any questions you have with your health care provider.   Document Released: 04/21/2005 Document Revised: 04/02/2015 Document Reviewed: 11/06/2014 Elsevier Interactive Patient Education [employer].

## 2015-07-30 NOTE — ED Provider Notes (Signed)
CSN: [id number]     Arrival date & time 07/30/15  0844 History   First MD Initiated Contact with Patient 07/30/15 [phone]     Chief Complaint  Patient presents with  . Nausea     (Consider location/radiation/quality/duration/timing/severity/associated sxs/prior Treatment) HPI Comments: 75 year old female with history of hyperlipidemia, hypertension presents for her nausea. The patient underwent surgery for a fractured left humerus December 17. She reports since that time she has had nausea. She said it's affecting her ability to eat. She has been prescribed Zofran outpatient. She says that she has been taking this. She reports that the Zofran was decreased in dosage recently. She denies abdominal pain or vomiting. She reports normal bowel movements despite being on pain medication. Denies chest pain or palpitations. No shortness of breath. She denies ever having symptoms like this before. She does not believe that eating anything in particular makes the symptoms worse.   Past Medical History  Diagnosis Date  . Hypertension   . Hyperlipidemia   . Stone, kidney   . Headache    Past Surgical History  Procedure Laterality Date  . Abdominal hysterectomy  1998  . Parathyroidectomy    . Orif wrist fracture  01/18/2012    Procedure: OPEN REDUCTION INTERNAL FIXATION (ORIF) WRIST FRACTURE;  Surgeon: Marybelle Killings, MD;  Location: [facility];  Service: Orthopedics;  Laterality: Left;  . Dilation and curettage of uterus    . Tubal ligation    . Colonoscopy w/ biopsies and polypectomy    . Multiple tooth extractions    . Orif humerus fracture Left 07/11/2015    Procedure: OPEN REDUCTION INTERNAL FIXATION (ORIF) PROXIMAL HUMERUS FRACTURE;  Surgeon: Marybelle Killings, MD;  Location: [facility];  Service: Orthopedics;  Laterality: Left;   Family History  Problem Relation Age of Onset  . Cancer Father   . Colon cancer Father 1  . Diabetes Other    Social History  Substance Use Topics  . Smoking status: Never  Smoker   . Smokeless tobacco: Never Used  . Alcohol Use: No   OB History    No data available     Review of Systems  Constitutional: Positive for appetite change. Negative for fever, chills and fatigue.  HENT: Negative for congestion, rhinorrhea and sinus pressure.   Respiratory: Negative for cough, chest tightness and shortness of breath.   Cardiovascular: Negative for chest pain and palpitations.  Gastrointestinal: Positive for nausea. Negative for vomiting, abdominal pain, diarrhea and constipation.  Genitourinary: Negative for dysuria, urgency, frequency, hematuria and decreased urine volume.  Musculoskeletal: Negative for myalgias and back pain.  Skin: Negative for rash.  Neurological: Negative for dizziness, weakness and numbness.  Hematological: Does not bruise/bleed easily.      Allergies  Review of patient's allergies indicates no known allergies.  Home Medications   Prior to Admission medications   Medication Sig Start Date End Date Taking? Authorizing Provider  amLODipine (NORVASC) 5 MG tablet TAKE 1 TABLET (5 MG TOTAL) BY MOUTH DAILY. 05/05/15  Yes Lucille Passy, MD  aspirin EC 81 MG tablet Take 81 mg by mouth daily.   Yes Historical Provider, MD  Calcium Carb-Cholecalciferol (CALCIUM + D3 PO) Take 1 tablet by mouth daily.   Yes Historical Provider, MD  etodolac (LODINE) 400 MG tablet TAKE 1 TABLET BY MOUTH ONCE A DAY AS DIRECTED 04/22/15  Yes Lucille Passy, MD  HYDROcodone-acetaminophen (NORCO/VICODIN) 5-325 MG tablet Take 1 tablet by mouth every 8 (eight) hours as needed for  moderate pain.  07/25/15  Yes Historical Provider, MD  ondansetron (ZOFRAN ODT) 8 MG disintegrating tablet Take 1 tablet (8 mg total) by mouth every 8 (eight) hours as needed for nausea or vomiting. 07/01/15  Yes Tatyana Kirichenko, PA-C  promethazine (PHENERGAN) 25 MG tablet Take 25 mg by mouth every 6 (six) hours as needed. for nausea 07/16/15  Yes Historical Provider, MD  Red Yeast Rice 600 MG  TABS Take 1 tablet by mouth daily.    Yes Historical Provider, MD  triamterene-hydrochlorothiazide (MAXZIDE-25) 37.5-25 MG tablet TAKE 1 TABLET BY MOUTH ONCE A DAY 05/05/15  Yes Lucille Passy, MD  clotrimazole-betamethasone (LOTRISONE) cream APPLY TO AFFECTED AREA TWICE DAILY Patient not taking: Reported on 07/30/2015 07/09/13   Jearld Fenton, NP  HYDROcodone-acetaminophen (NORCO) 10-325 MG tablet Take 1 tablet by mouth every 4 (four) hours as needed for severe pain (breakthrough pain). Patient not taking: Reported on 07/30/2015 07/13/15   Marybelle Killings, MD   BP 135/61 mmHg  Pulse 68  Temp(Src) 97.8 F (36.6 C) (Oral)  Resp 19  Ht 5\' 4"  (1.626 m)  Wt 141 lb (63.957 kg)  BMI 24.19 kg/m2  SpO2 99% Physical Exam  Constitutional: She is oriented to person, place, and time. She appears well-developed and well-nourished. No distress.  HENT:  Head: Normocephalic and atraumatic.  Right Ear: External ear normal.  Left Ear: External ear normal.  Nose: Nose normal.  Mouth/Throat: Oropharynx is clear and moist. No oropharyngeal exudate.  Eyes: EOM are normal. Pupils are equal, round, and reactive to light.  Neck: Normal range of motion. Neck supple.  Cardiovascular: Normal rate, regular rhythm, normal heart sounds and intact distal pulses.   No murmur heard. Pulmonary/Chest: Effort normal. No respiratory distress. She has no wheezes. She has no rales.  Abdominal: Soft. She exhibits no distension. There is no tenderness.  Musculoskeletal: Normal range of motion. She exhibits no edema or tenderness.  Neurological: She is alert and oriented to person, place, and time.  Skin: Skin is warm and dry. No rash noted. She is not diaphoretic.  Vitals reviewed.   ED Course  Procedures (including critical care time) Labs Review Labs Reviewed  COMPREHENSIVE METABOLIC PANEL - Abnormal; Notable for the following:    Glucose, Bld 103 (*)    ALT 8 (*)    All other components within normal limits  CBC WITH  DIFFERENTIAL/PLATELET  LIPASE, BLOOD  I-STAT TROPOININ, ED    Imaging Review Dg Abd Acute W/chest  07/30/2015  CLINICAL DATA:  Nausea for 2 weeks since left humeral surgery 2 weeks ago. EXAM: DG ABDOMEN ACUTE W/ 1V CHEST COMPARISON:  07/11/2015; 11/04/2014 FINDINGS: Locking plate fixation of the left surgical neck fracture noted. There appears to be a separate greater tuberosity fracture. Tortuous thoracic aorta. Thoracic spondylosis. Mild bony demineralization. Indistinct primarily streaky opacity in the left lower lobe and along the left hemidiaphragm. The lungs appear otherwise clear. No free intraperitoneal gas beneath the hemidiaphragms. Bowel gas pattern normal aside from borderline prominence of colonic stool. Near the inferior margin of the right hepatic lobe, there is a chronic 10 mm calcification which could be from costal cartilage calcification or a gallstone. IMPRESSION: 1. Indistinct streaky opacity at the left lung base possibly from atelectasis or bronchopneumonia. 2. Locking plate fixation the left humerus noted. 3. Borderline prominence of stool in the colon but otherwise normal bowel gas pattern. 4. Calcification in the right abdomen could be from costal cartilage calcification or a gallstone. Electronically Signed  By: Van Clines M.D.   On: 07/30/2015 11:47   I have personally reviewed and evaluated these images and lab results as part of my medical decision-making.   EKG Interpretation   Date/Time:  Wednesday July 30 2015 10:23:55 EST Ventricular Rate:  60 PR Interval:  208 QRS Duration: 95 QT Interval:  437 QTC Calculation: 437 R Axis:   0 Text Interpretation:  Sinus rhythm Borderline T abnormalities, diffuse  leads No significant change since last tracing Confirmed by Gawain Crombie  [id number]) on 07/30/2015 11:32:53 AM      MDM  Patient seen and evaluated in stable condition. Benign examination. No abdominal tenderness. Labs and EKG unremarkable. Acute  abdominal series without acute process. Findings in the chest seems most likely atelectasis as patient has had no fever or cough or sputum production. Patient was given GI cocktail with complete resolution of symptoms. She was discharged home in stable condition with a prescription for omeprazole and referral to a gastroenterologist. She expressed understanding and agreement with the plan of care. Final diagnoses:  None    1. Nausea, likely GERD vs ulcer    Harvel Quale, MD 07/30/15 [phone]

## 2015-08-05 NOTE — Discharge Summary (Signed)
Patient ID: ANALYSE FRUEHAUF MRN: [id number] DOB/AGE: 10/31/1940 75 y.o.  Admit date: 07/11/2015 Discharge date: 08/05/2015  Admission Diagnoses:  Active Problems:   Proximal humerus fracture   Discharge Diagnoses:  Active Problems:   Proximal humerus fracture  status post Procedure(s): OPEN REDUCTION INTERNAL FIXATION (ORIF) PROXIMAL HUMERUS FRACTURE  Past Medical History  Diagnosis Date  . Hypertension   . Hyperlipidemia   . Stone, kidney   . Headache     Surgeries: Procedure(s): OPEN REDUCTION INTERNAL FIXATION (ORIF) PROXIMAL HUMERUS FRACTURE on 07/11/2015   Consultants:    Discharged Condition: Improved  Hospital Course: Amy Barron is an 75 y.o. female who was admitted 07/11/2015 for operative treatment of proximal humerus fracture. Patient failed conservative treatments (please see the history and physical for the specifics) and had severe unremitting pain that affects sleep, daily activities and work/hobbies. After pre-op clearance, the patient was taken to the operating room on 07/11/2015 and underwent  Procedure(s): OPEN REDUCTION INTERNAL FIXATION (ORIF) PROXIMAL HUMERUS FRACTURE.    Patient was given perioperative antibiotics:  Anti-infectives    Start     Dose/Rate Route Frequency Ordered Stop   07/11/15 1830  ceFAZolin (ANCEF) IVPB 2 g/50 mL premix     2 g 100 mL/hr over 30 Minutes Intravenous 3 times per day 07/11/15 1651 07/12/15 0731   07/11/15 0531  ceFAZolin (ANCEF) IVPB 2 g/50 mL premix     2 g 100 mL/hr over 30 Minutes Intravenous On call to O.R. 07/11/15 0531 07/11/15 1035       Patient was given sequential compression devices and early ambulation to prevent DVT.   Patient benefited maximally from hospital stay and there were no complications. At the time of discharge, the patient was urinating/moving their bowels without difficulty, tolerating a regular diet, pain is controlled with oral pain medications and they have been cleared by  PT/OT.   Recent vital signs: No data found.    Recent laboratory studies: No results for input(s): WBC, HGB, HCT, PLT, NA, K, CL, CO2, BUN, CREATININE, GLUCOSE, INR, CALCIUM in the last 72 hours.  Invalid input(s): PT, 2   Discharge Medications:     Medication List    STOP taking these medications        HYDROcodone-acetaminophen 5-325 MG tablet  Commonly known as:  NORCO  Replaced by:  HYDROcodone-acetaminophen 10-325 MG tablet      TAKE these medications        amLODipine 5 MG tablet  Commonly known as:  NORVASC  TAKE 1 TABLET (5 MG TOTAL) BY MOUTH DAILY.     aspirin EC 81 MG tablet  Take 81 mg by mouth daily.     CALCIUM + D3 PO  Take 1 tablet by mouth daily.     clotrimazole-betamethasone cream  Commonly known as:  LOTRISONE  APPLY TO AFFECTED AREA TWICE DAILY     etodolac 400 MG tablet  Commonly known as:  LODINE  TAKE 1 TABLET BY MOUTH ONCE A DAY AS DIRECTED     HYDROcodone-acetaminophen 10-325 MG tablet  Commonly known as:  NORCO  Take 1 tablet by mouth every 4 (four) hours as needed for severe pain (breakthrough pain).     ondansetron 8 MG disintegrating tablet  Commonly known as:  ZOFRAN ODT  Take 1 tablet (8 mg total) by mouth every 8 (eight) hours as needed for nausea or vomiting.     Red Yeast Rice 600 MG Tabs  Take 1 tablet by mouth  daily.     triamterene-hydrochlorothiazide 37.5-25 MG tablet  Commonly known as:  MAXZIDE-25  TAKE 1 TABLET BY MOUTH ONCE A DAY        Diagnostic Studies: Dg Chest 1 View  07/11/2015  CLINICAL DATA:  Preop left humeral fracture. EXAM: CHEST 1 VIEW COMPARISON:  01/18/2012 FINDINGS: There is a spiculated opacity measuring approximately 18 mm projecting over the right upper lobe. There is no focal consolidation. There is no pleural effusion or pneumothorax. The heart and mediastinal contours are unremarkable. There is mild thoracic spine spondylosis. IMPRESSION: Spiculated opacity projecting over the right upper lobe  which may reflect a spiculated pulmonary nodule. Recommend further evaluation with a CT of the chest. No acute cardiopulmonary disease. Electronically Signed   By: Kathreen Devoid   On: 07/11/2015 07:47   Dg Abd Acute W/chest  07/30/2015  CLINICAL DATA:  Nausea for 2 weeks since left humeral surgery 2 weeks ago. EXAM: DG ABDOMEN ACUTE W/ 1V CHEST COMPARISON:  07/11/2015; 11/04/2014 FINDINGS: Locking plate fixation of the left surgical neck fracture noted. There appears to be a separate greater tuberosity fracture. Tortuous thoracic aorta. Thoracic spondylosis. Mild bony demineralization. Indistinct primarily streaky opacity in the left lower lobe and along the left hemidiaphragm. The lungs appear otherwise clear. No free intraperitoneal gas beneath the hemidiaphragms. Bowel gas pattern normal aside from borderline prominence of colonic stool. Near the inferior margin of the right hepatic lobe, there is a chronic 10 mm calcification which could be from costal cartilage calcification or a gallstone. IMPRESSION: 1. Indistinct streaky opacity at the left lung base possibly from atelectasis or bronchopneumonia. 2. Locking plate fixation the left humerus noted. 3. Borderline prominence of stool in the colon but otherwise normal bowel gas pattern. 4. Calcification in the right abdomen could be from costal cartilage calcification or a gallstone. Electronically Signed   By: Van Clines M.D.   On: 07/30/2015 11:47   Dg Humerus Left  07/11/2015  CLINICAL DATA:  Open reduction of left proximal humeral fracture. EXAM: LEFT HUMERUS - 2+ VIEW; DG C-ARM 61-120 MIN FLUOROSCOPY TIME:  36 seconds. COMPARISON:  July 01, 2015. FINDINGS: Four intraoperative fluoroscopic images of the proximal left humerus demonstrates surgical internal fixation of proximal left humeral fracture. Improved alignment of fracture components is noted. IMPRESSION: Status post surgical internal fixation of proximal left humeral fracture.  Electronically Signed   By: Marijo Conception, M.D.   On: 07/11/2015 12:24   Dg C-arm 1-60 Min  07/11/2015  CLINICAL DATA:  Open reduction of left proximal humeral fracture. EXAM: LEFT HUMERUS - 2+ VIEW; DG C-ARM 61-120 MIN FLUOROSCOPY TIME:  36 seconds. COMPARISON:  July 01, 2015. FINDINGS: Four intraoperative fluoroscopic images of the proximal left humerus demonstrates surgical internal fixation of proximal left humeral fracture. Improved alignment of fracture components is noted. IMPRESSION: Status post surgical internal fixation of proximal left humeral fracture. Electronically Signed   By: Marijo Conception, M.D.   On: 07/11/2015 12:24          Follow-up Information    Follow up with YATES,MARK C, MD In 3 days.   Specialty:  Orthopedic Surgery   Why:  has appt on Wed.    Contact information:   [place] [address] [phone]       Discharge Plan:  discharge to home  Disposition:     Signed: Lanae Crumbly 08/05/2015, 1:40 PM

## 2015-08-13 DIAGNOSIS — S42252D Displaced fracture of greater tuberosity of left humerus, subsequent encounter for fracture with routine healing: Secondary | ICD-10-CM | POA: Diagnosis not present

## 2015-08-14 ENCOUNTER — Encounter: Payer: Self-pay | Admitting: Family Medicine

## 2015-08-14 ENCOUNTER — Ambulatory Visit (INDEPENDENT_AMBULATORY_CARE_PROVIDER_SITE_OTHER): Payer: Medicare Other | Admitting: Family Medicine

## 2015-08-14 VITALS — BP 130/70 | HR 84 | Temp 98.0°F | Wt 139.8 lb

## 2015-08-14 DIAGNOSIS — K219 Gastro-esophageal reflux disease without esophagitis: Secondary | ICD-10-CM

## 2015-08-14 DIAGNOSIS — R112 Nausea with vomiting, unspecified: Secondary | ICD-10-CM

## 2015-08-14 DIAGNOSIS — S42202S Unspecified fracture of upper end of left humerus, sequela: Secondary | ICD-10-CM | POA: Diagnosis not present

## 2015-08-14 DIAGNOSIS — R110 Nausea: Secondary | ICD-10-CM | POA: Insufficient documentation

## 2015-08-14 NOTE — Patient Instructions (Signed)
Great to see you. Try over the counter Pepcid or zantac and just use the omeprazole for severe indigestion.

## 2015-08-14 NOTE — Progress Notes (Signed)
   Subjective:   Patient ID: Amy Barron, female    DOB: 07/28/1940, 75 y.o.   MRN: [id number]  Amy Barron is a pleasant 75 y.o. year old female who presents to clinic today with Fall  and ER follow up on 08/14/2015  HPI:  [place] doing yoga in December 2016 and fractured her left humerus.  Underwent ORIF by Dr. Lorin Mercy on 07/11/15. Sling removed yesterday and she starts PT on Monday.  On 07/30/15, she presented to ER with 2 weeks of nausea, since her surgery.  No diarrhea.  Notes reviewed.  Labs unremarkable, troponin neg. EKG unchanged. DG abd with chest neg.  Given GI cocktail and rx for omeprazole 20 mg twice daily.  She feels much better.  Nausea has resolved.  Review of Systems  Constitutional: Negative.   HENT: Negative.   Respiratory: Negative.   Cardiovascular: Negative.   Gastrointestinal: Positive for nausea. Negative for vomiting, abdominal pain, diarrhea, constipation, blood in stool, abdominal distention, anal bleeding and rectal pain.  Musculoskeletal: Negative.   Skin: Negative.   Hematological: Negative.   Psychiatric/Behavioral: Negative.   All other systems reviewed and are negative.      Objective:    BP 130/70 mmHg  Pulse 84  Temp(Src) 98 F (36.7 C) (Oral)  Wt 139 lb 12 oz (63.39 kg)  SpO2 98%   Physical Exam  Constitutional: She is oriented to person, place, and time. She appears well-developed and well-nourished. No distress.  HENT:  Head: Normocephalic.  Eyes: Conjunctivae are normal.  Cardiovascular: Normal rate.   Pulmonary/Chest: Effort normal.  Musculoskeletal:  Vertical scar on left upper arm well healed  Neurological: She is alert and oriented to person, place, and time. No cranial nerve deficit.  Skin: Skin is warm and dry. She is not diaphoretic.  Psychiatric: She has a normal mood and affect. Her behavior is normal. Judgment and thought content normal.  Nursing note and vitals reviewed.         Assessment & Plan:    Gastroesophageal reflux disease, esophagitis presence not specified  Proximal humerus fracture, left, sequela  Nausea and vomiting, intractability of vomiting not specified, unspecified vomiting type No Follow-up on file.

## 2015-08-14 NOTE — Assessment & Plan Note (Signed)
Reviewed ER notes. ? GERD vs side effects from anesthesia and rxs. Advised to D/c omeprazole and try H2 blocker like pepcid or xantac since her symptoms are now well controlled. The patient indicates understanding of these issues and agrees with the plan.

## 2015-08-14 NOTE — Progress Notes (Signed)
Pre visit review using our clinic review tool, if applicable. No additional management support is needed unless otherwise documented below in the visit note. 

## 2015-08-14 NOTE — Assessment & Plan Note (Signed)
S/p ORIF. Starting PT Monday.  Denying pain currently.

## 2015-08-18 ENCOUNTER — Ambulatory Visit: Payer: Medicare Other | Attending: Orthopaedic Surgery

## 2015-08-18 DIAGNOSIS — X58XXXA Exposure to other specified factors, initial encounter: Secondary | ICD-10-CM | POA: Insufficient documentation

## 2015-08-18 DIAGNOSIS — R29898 Other symptoms and signs involving the musculoskeletal system: Secondary | ICD-10-CM | POA: Insufficient documentation

## 2015-08-18 DIAGNOSIS — S42202B Unspecified fracture of upper end of left humerus, initial encounter for open fracture: Secondary | ICD-10-CM | POA: Diagnosis not present

## 2015-08-18 DIAGNOSIS — M25512 Pain in left shoulder: Secondary | ICD-10-CM

## 2015-08-18 NOTE — Patient Instructions (Signed)
Ice 20 mins, 3 times a day  Positioning with pillows at night  and posture  PT POC 3 times a week x 8 weeks.   Avoid activities that aggravate  symptoms.

## 2015-08-18 NOTE — Therapy (Addendum)
[place], [place], [address] Phone: [phone]   Fax:  [phone]  Physical Therapy Evaluation  Patient Details  Name: Amy Barron MRN: [id number] Date of Birth: 03-18-1941 Referring Provider: Lorin Mercy  Encounter Date: 08/18/2015      PT End of Session - 08/18/15 1246    Visit Number 1   Number of Visits 16   Date for PT Re-Evaluation 10/10/15   PT Start Time 1150   PT Stop Time 1230   PT Time Calculation (min) 40 min   Activity Tolerance Patient tolerated treatment well   Behavior During Therapy [facility] for tasks assessed/performed      Past Medical History  Diagnosis Date  . Hypertension   . Hyperlipidemia   . Stone, kidney     Past Surgical History  Procedure Laterality Date  . Abdominal hysterectomy  1998  . Parathyroidectomy    . Orif wrist fracture  01/18/2012    Procedure: OPEN REDUCTION INTERNAL FIXATION (ORIF) WRIST FRACTURE;  Surgeon: Marybelle Killings, MD;  Location: [facility];  Service: Orthopedics;  Laterality: Left;  . Dilation and curettage of uterus    . Tubal ligation    . Colonoscopy w/ biopsies and polypectomy    . Multiple tooth extractions    . Orif humerus fracture Left 07/11/2015    Procedure: OPEN REDUCTION INTERNAL FIXATION (ORIF) PROXIMAL HUMERUS FRACTURE;  Surgeon: Marybelle Killings, MD;  Location: [facility];  Service: Orthopedics;  Laterality: Left;    There were no vitals filed for this visit.  Visit Diagnosis:  Pain in joint of left shoulder  Proximal humerus fracture, left, open, initial encounter  Weakness of left arm      Subjective Assessment - 08/18/15 1156     Subjective Pt had a fall on Dec 6th and fractures L proximal humerus. Pt underwent L humeral ORIF on Dec 16th by Dr Lorin Mercy. Pt referred to PT for AAROM only- NO RESISTANCE, per MD orders. Pt returns to see Dr Lorin Mercy on Feb 14th, 2017.    Pertinent History HTN    Limitations Lifting;House hold activities;Other  (comment)   How long can you sit comfortably? Not limited    How long can you stand comfortably? Not limited    Patient Stated Goals Return to yoga, grooming hair, cleaning house.     Currently in Pain? Yes   Pain Score 1    Pain Location Shoulder   Pain Orientation Left   Pain Descriptors / Indicators Aching;Dull   Pain Type Surgical pain   Pain Onset 1 to 4 weeks ago   Pain Frequency Intermittent   Aggravating Factors  reaching, using arm    Pain Relieving Factors using sling, tylenol, ice    Effect of Pain on Daily Activities grooming, reaching, lifting, carrying, household activiites.             [facility] PT Assessment - 08/18/15 1200    Assessment   Medical Diagnosis R humeral fracture and ORIF   Referring Provider Yates   Onset Date/Surgical Date 07/11/15   Hand Dominance Right   Next MD Visit 09/09/15   Prior Therapy none   Restrictions   Weight Bearing Restrictions No   Other Position/Activity Restrictions NO RESISTANCE per MD orders   Balance Screen   Has the patient fallen in the past 6 months Yes   How many times? 2   Has the patient had a decrease in activity level because of a fear of  falling?  Yes   Is the patient reluctant to leave their home because of a fear of falling?  No   Home Ecologist residence   Prior Function   Level of Independence Independent   Vocation Retired   Observation/Other Assessments   Focus on Therapeutic Outcomes (FOTO)  Intake: 59% limited, Predicted: 34% limited   Posture/Postural Control   Posture/Postural Control Postural limitations   Postural Limitations Rounded Shoulders;Forward head   ROM / Strength   AROM / PROM / Strength AROM;PROM;Strength   AROM   AROM Assessment Site Shoulder   Right/Left Shoulder Left   Left Shoulder Extension 45 Degrees   Left Shoulder Flexion 45 Degrees   Left Shoulder ABduction 40 Degrees   Left Shoulder Internal Rotation --  FIR unable    Left Shoulder External  Rotation --  FER unable    PROM   PROM Assessment Site Shoulder   Right/Left Shoulder Left   Left Shoulder Flexion 85 Degrees   Left Shoulder ABduction 65 Degrees   Left Shoulder Internal Rotation 55 Degrees  @45  degrees ABD   Left Shoulder External Rotation 0 Degrees  @ 45 degrees ABD   Strength   Strength Assessment Site Shoulder   Right/Left Shoulder Left   Left Shoulder Flexion 2-/5   Left Shoulder Extension 3-/5   Left Shoulder ABduction 2-/5   Left Shoulder Internal Rotation 2/5   Left Shoulder External Rotation 2-/5   Flexibility   Soft Tissue Assessment /Muscle Length --   Palpation   Palpation comment TP at L bicep and deltoid. mm tightness at L bicep                    [facility] Adult PT Treatment/Exercise - 08/18/15 1200    Self-Care   Self-Care Posture;Heat/Ice Application   Posture improtance of posture and positioning at night with pillows for comfort    Heat/Ice Application ice 20 mins to L UE , 3 times a day.    Manual Therapy   Manual Therapy Soft tissue mobilization;Passive ROM   Soft tissue mobilization STM to L bicep and deltoid with decreased restriction and pain following    Passive ROM L shoulder PROM all directions                 PT Education - 08/18/15 1245    Education provided Yes   Education Details positioning, posture, icing, PT POC, avoid painful activities.    Person(s) Educated Patient   Methods Explanation;Demonstration;Verbal cues;Handout   Comprehension Verbalized understanding;Returned demonstration;Tactile cues required          PT Short Term Goals - 08/18/15 1255    PT SHORT TERM GOAL #1   Title Pt will be I with initial HEP for continued strengthening and mobility by 09/18/15.   Time 4   Period Weeks   Status New   PT SHORT TERM GOAL #2   Title L shoulder PROM will improve to 120 degrees pain-free for improved overhead reaching activities by 09/18/15.   Time 4   Period Weeks   Status New            PT Long Term Goals - 08/18/15 1257    PT LONG TERM GOAL #1   Title L shoulder strength flexion will improve to 3+/5 in order to lift 2# overhead x 10 reps without pain in order to return to pain-free functional activities for lifting overhead by 10/07/15.    Time 8  Period Weeks   Status New   PT LONG TERM GOAL #2   Title FOTO score will improve from 59% limited to <34% to demo improved function and mobility by 10/07/15.    Time 8   Period Weeks   Status New   PT LONG TERM GOAL #3   Title FER will improve to C7 in orfer for pt to wash her hair by 10/07/15.    Time 4   Period Weeks   Status New   PT LONG TERM GOAL #4   Title L shoulder flexion AROM will imrpove to 120 degrees for overhead reaching by 10/07/15.    Time 8   Period Weeks   Status New               Plan - 08-27-15 1247    Clinical Impression Statement Pt presents for low complexity evaluation for shoulder pain following L shoulder proximal humeral fx and ORIF on Dec 16th, 2017 by Dr Lorin Mercy.  Pt presents with impairments including pain, impaired mobility/ROM, and impaired strength, which limit pt's functional abilities with reaching, lifting, carrying, dressing, grooming.  Pt will benefit from oupt PT for 3 times a week for 8 weeks for UE ROM, stretching, and progressing to UE strengthening with pt education in order to address these impairments and functional limitations and return pt to pain-free PLOF. NO RESISTIVE TRAINING per MD orders.    Pt will benefit from skilled therapeutic intervention in order to improve on the following deficits Decreased activity tolerance;Decreased range of motion;Decreased scar mobility;Increased edema;Decreased strength;Impaired UE functional use;Impaired perceived functional ability;Pain;Increased muscle spasms   Rehab Potential Good   PT Frequency 3x / week   PT Duration 8 weeks   PT Treatment/Interventions ADLs/Self Care Home Management;Cryotherapy;Electrical Stimulation;Moist  Heat;Functional mobility training;Therapeutic activities;Therapeutic exercise;Neuromuscular re-education;Patient/family education;Manual techniques;Passive range of motion   PT Next Visit Plan Est HEP. PROM, MT prn. AAROM. NO RESISTIVE TRAINING per MD orders.    Consulted and Agree with Plan of Care Patient          G-Codes - August 27, 2015 1304    Functional Limitation Carrying, moving and handling objects   Carrying, Moving and Handling Objects Current Status [phone]) At least 60 percent but less than 80 percent impaired, limited or restricted   Carrying, Moving and Handling Objects Goal Status [id number]) At least 1 percent but less than 20 percent impaired, limited or restricted       Problem List Patient Active Problem List   Diagnosis Date Noted  . GERD (gastroesophageal reflux disease) 08/14/2015  . Nausea with vomiting 08/14/2015  . Proximal humerus fracture 07/11/2015  . Allergic rhinitis 04/08/2015  . Distal radius fracture, left 01/19/2012    Class: Diagnosis of  . HLD (hyperlipidemia) 09/30/2010  . Essential hypertension 09/30/2010    [name], PT 08-27-2015, 1:06 PM  [facility] [address] [place], [place], [address] Phone: [phone]   Fax:  [phone]  Name: Amy Barron MRN: [id number] Date of Birth: July 29, 1940

## 2015-08-20 ENCOUNTER — Ambulatory Visit: Payer: Medicare Other

## 2015-08-20 DIAGNOSIS — R29898 Other symptoms and signs involving the musculoskeletal system: Secondary | ICD-10-CM

## 2015-08-20 DIAGNOSIS — M25512 Pain in left shoulder: Secondary | ICD-10-CM | POA: Diagnosis not present

## 2015-08-20 DIAGNOSIS — S42202B Unspecified fracture of upper end of left humerus, initial encounter for open fracture: Secondary | ICD-10-CM | POA: Diagnosis not present

## 2015-08-20 NOTE — Therapy (Signed)
[place], [place], [address] Phone: [phone]   Fax:  [phone]  Physical Therapy Treatment  Patient Details  Name: Amy Barron MRN: [id number] Date of Birth: Dec 02, 1940 Referring Provider: Lorin Mercy  Encounter Date: 08/20/2015      PT End of Session - 08/20/15 0852    Visit Number 2   Number of Visits 16   Date for PT Re-Evaluation 10/10/15   PT Start Time 0843   PT Stop Time 0932   PT Time Calculation (min) 49 min   Activity Tolerance Patient tolerated treatment well   Behavior During Therapy [facility] for tasks assessed/performed      Past Medical History  Diagnosis Date  . Hypertension   . Hyperlipidemia   . Stone, kidney     Past Surgical History  Procedure Laterality Date  . Abdominal hysterectomy  1998  . Parathyroidectomy    . Orif wrist fracture  01/18/2012    Procedure: OPEN REDUCTION INTERNAL FIXATION (ORIF) WRIST FRACTURE;  Surgeon: Marybelle Killings, MD;  Location: [place];  Service: Orthopedics;  Laterality: Left;  . Dilation and curettage of uterus    . Tubal ligation    . Colonoscopy w/ biopsies and polypectomy    . Multiple tooth extractions    . Orif humerus fracture Left 07/11/2015    Procedure: OPEN REDUCTION INTERNAL FIXATION (ORIF) PROXIMAL HUMERUS FRACTURE;  Surgeon: Marybelle Killings, MD;  Location: [place];  Service: Orthopedics;  Laterality: Left;    There were no vitals filed for this visit.  Visit Diagnosis:  Pain in joint of left shoulder  Proximal humerus fracture, left, open, initial encounter  Weakness of left arm      Subjective Assessment - 08/20/15 0844    Subjective Pt reports increased pain today, but attributes it to cold weather, or "sleeping on it wrong".  No increase in pain after last visit / Eval.     Currently in Pain? Yes   Pain Score 2    Pain Location Shoulder   Pain Orientation Left   Pain Descriptors / Indicators Aching;Dull   Pain Type Surgical  pain            [facility] PT Assessment - 08/20/15 0001    PROM   Left Shoulder Flexion 110 Degrees   Left Shoulder ABduction 70 Degrees   Left Shoulder External Rotation 10 Degrees                     [facility] Adult PT Treatment/Exercise - 08/20/15 0001    Exercises   Exercises Shoulder   Shoulder Exercises: Standing   External Rotation AAROM;10 reps   External Rotation Weight (lbs) x 2 sets    External Rotation Limitations with PB on table and UE ranger x 10 reps each.    Internal Rotation AAROM;10 reps   Internal Rotation Weight (lbs) x 2 sets    Internal Rotation Limitations with PB on table and UE ranger x 10 reps each.    Flexion AAROM;10 reps;Other (comment)   Shoulder Flexion Weight (lbs) x 2 sets    Flexion Limitations with PB on table and UE ranger x 10 reps each.    ABduction AAROM   Shoulder ABduction Weight (lbs) x 2 sets    ABduction Limitations with PB on table and UE ranger x 10 reps each.    Shoulder Exercises: ROM/Strengthening   Pendulum x 20 reps each,  circles, flex/ext, horz abd/add  Modalities   Modalities Cryotherapy   Cryotherapy   Number Minutes Cryotherapy 10 Minutes   Cryotherapy Location Shoulder   Type of Cryotherapy Ice pack   Manual Therapy   Manual Therapy Joint mobilization;Soft tissue mobilization;Passive ROM   Joint Mobilization gentle joint distraction with oscillation decreased pain from 5/10 to 2/10    Soft tissue mobilization STM to L bicep and deltoid with decreased restriction and pain following . scar mobilization.   Passive ROM L shoulder PROM all directions                   PT Short Term Goals - 08/18/15 1255    PT SHORT TERM GOAL #1   Title Pt will be I with initial HEP for continued strengthening and mobility by 09/18/15.   Time 4   Period Weeks   Status New   PT SHORT TERM GOAL #2   Title L shoulder PROM will improve to 120 degrees pain-free for improved overhead reaching activities by 09/18/15.    Time 4   Period Weeks   Status New           PT Long Term Goals - 08/18/15 1257    PT LONG TERM GOAL #1   Title L shoulder strength flexion will improve to 3+/5 in order to lift 2# overhead x 10 reps without pain in order to return to pain-free functional activities for lifting overhead by 10/07/15.    Time 8   Period Weeks   Status New   PT LONG TERM GOAL #2   Title FOTO score will improve from 59% limited to <34% to demo improved function and mobility by 10/07/15.    Time 8   Period Weeks   Status New   PT LONG TERM GOAL #3   Title FER will improve to C7 in orfer for pt to wash her hair by 10/07/15.    Time 4   Period Weeks   Status New   PT LONG TERM GOAL #4   Title L shoulder flexion AROM will imrpove to 120 degrees for overhead reaching by 10/07/15.    Time 8   Period Weeks   Status New               Plan - 08/20/15 [id number]    Clinical Impression Statement Improvements noted in PROM measured today.  Pain with endrange of flexion and ER.  Pt tolerated AAROM ther ex well, but STM to shoulder mm increased pain to 5/10. Pain was decreased to 2/10 again ( baseline) following with MT including gentle joint distraction and oscillation.   PT Next Visit Plan Est HEP with AAROM ( table slides)  PROM, MT prn. AAROM. NO RESISTIVE TRAINING per MD orders.    PT Home Exercise Plan pendulums    Consulted and Agree with Plan of Care Patient        Problem List Patient Active Problem List   Diagnosis Date Noted  . GERD (gastroesophageal reflux disease) 08/14/2015  . Nausea with vomiting 08/14/2015  . Proximal humerus fracture 07/11/2015  . Allergic rhinitis 04/08/2015  . Distal radius fracture, left 01/19/2012    Class: Diagnosis of  . HLD (hyperlipidemia) 09/30/2010  . Essential hypertension 09/30/2010    [name], PT 08/20/2015, 9:32 AM  [facility] [address] [place], [place], [address] Phone: [phone]    Fax:  [phone]  Name: AZMINA HEINZMANN MRN: [id number] Date of Birth: 14-Feb-1941

## 2015-08-22 ENCOUNTER — Ambulatory Visit: Payer: Medicare Other | Admitting: Physical Therapy

## 2015-08-22 DIAGNOSIS — R29898 Other symptoms and signs involving the musculoskeletal system: Secondary | ICD-10-CM

## 2015-08-22 DIAGNOSIS — M25512 Pain in left shoulder: Secondary | ICD-10-CM | POA: Diagnosis not present

## 2015-08-22 DIAGNOSIS — S42202B Unspecified fracture of upper end of left humerus, initial encounter for open fracture: Secondary | ICD-10-CM | POA: Diagnosis not present

## 2015-08-22 NOTE — Therapy (Signed)
[place], [place], [address] Phone: [phone]   Fax:  [phone]  Physical Therapy Treatment  Patient Details  Name: Amy Barron MRN: [id number] Date of Birth: February 08, 1941 Referring Provider: Lorin Mercy  Encounter Date: 08/22/2015      PT End of Session - 08/22/15 0909    Visit Number 3   Number of Visits 16   Date for PT Re-Evaluation 10/10/15   PT Start Time 0829   PT Stop Time 0917   PT Time Calculation (min) 48 min   Activity Tolerance Patient tolerated treatment well   Behavior During Therapy [facility] for tasks assessed/performed      Past Medical History  Diagnosis Date  . Hypertension   . Hyperlipidemia   . Stone, kidney     Past Surgical History  Procedure Laterality Date  . Abdominal hysterectomy  1998  . Parathyroidectomy    . Orif wrist fracture  01/18/2012    Procedure: OPEN REDUCTION INTERNAL FIXATION (ORIF) WRIST FRACTURE;  Surgeon: Marybelle Killings, MD;  Location: [place];  Service: Orthopedics;  Laterality: Left;  . Dilation and curettage of uterus    . Tubal ligation    . Colonoscopy w/ biopsies and polypectomy    . Multiple tooth extractions    . Orif humerus fracture Left 07/11/2015    Procedure: OPEN REDUCTION INTERNAL FIXATION (ORIF) PROXIMAL HUMERUS FRACTURE;  Surgeon: Marybelle Killings, MD;  Location: [place];  Service: Orthopedics;  Laterality: Left;    There were no vitals filed for this visit.  Visit Diagnosis:  Pain in joint of left shoulder  Proximal humerus fracture, left, open, initial encounter  Weakness of left arm      Subjective Assessment - 08/22/15 0832    Subjective Pt reports pain as a 1 and doesn't bother her unless she uses it, which she reports using it more now that she is out of the sling.    Currently in Pain? Yes   Pain Score 1    Pain Location Shoulder   Pain Orientation Left   Pain Descriptors / Indicators Tightness;Sore   Pain Type Surgical pain    Pain Onset 1 to 4 weeks ago                         [facility] Adult PT Treatment/Exercise - 08/22/15 0849    Shoulder Exercises: Supine   Horizontal ABduction AAROM;Left;20 reps   Horizontal ABduction Limitations cane   External Rotation AAROM;Left;20 reps  cane   Internal Rotation AAROM;Left;20 reps   Internal Rotation Limitations cane   Flexion AAROM;Both;10 reps   Flexion Limitations cane   ABduction AAROM;20 reps;Left   ABduction Limitations cane   Shoulder Exercises: Seated   Row 20 reps;Both   Row Limitations hold 5 secs   Shoulder Exercises: Standing   Flexion AAROM;Left;20 reps   Flexion Limitations with UE ranger   Shoulder Exercises: Pulleys   ABduction 2 minutes   ABduction Limitations L shoulder scaption   Cryotherapy   Number Minutes Cryotherapy 10 Minutes   Cryotherapy Location Shoulder   Type of Cryotherapy Ice pack   Manual Therapy   Manual Therapy Soft tissue mobilization   Soft tissue mobilization STM to L bicep and deltoid; scar tissue mob to decrease pain and restriction                PT Education - 08/22/15 0908    Education provided Yes  Education Details HEP with cane AAROM and scapular retraction   Person(s) Educated Patient   Methods Explanation;Demonstration;Handout   Comprehension Verbalized understanding;Returned demonstration          PT Short Term Goals - 08/18/15 1255    PT SHORT TERM GOAL #1   Title Pt will be I with initial HEP for continued strengthening and mobility by 09/18/15.   Time 4   Period Weeks   Status New   PT SHORT TERM GOAL #2   Title L shoulder PROM will improve to 120 degrees pain-free for improved overhead reaching activities by 09/18/15.   Time 4   Period Weeks   Status New           PT Long Term Goals - 08/18/15 1257    PT LONG TERM GOAL #1   Title L shoulder strength flexion will improve to 3+/5 in order to lift 2# overhead x 10 reps without pain in order to return to pain-free  functional activities for lifting overhead by 10/07/15.    Time 8   Period Weeks   Status New   PT LONG TERM GOAL #2   Title FOTO score will improve from 59% limited to <34% to demo improved function and mobility by 10/07/15.    Time 8   Period Weeks   Status New   PT LONG TERM GOAL #3   Title FER will improve to C7 in orfer for pt to wash her hair by 10/07/15.    Time 4   Period Weeks   Status New   PT LONG TERM GOAL #4   Title L shoulder flexion AROM will imrpove to 120 degrees for overhead reaching by 10/07/15.    Time 8   Period Weeks   Status New               Plan - 08/22/15 0909    Clinical Impression Statement Pt tolerated all AAROM well. Pt had difficulty with L shoulder abduction and scaption standing using the UE ranger so modified to pulleys AAROM for scaption. Sent pt home with AAROM exercises and began scapular stabilizing exercises. Finished session with soft tissue mobilization and cryotherapy to help with muscle soreness and swelling. Pt tolerated both well.    PT Next Visit Plan Review HEP prn; assess motion; scapula stabilizing exercises without resistance   Consulted and Agree with Plan of Care Patient        Problem List Patient Active Problem List   Diagnosis Date Noted  . GERD (gastroesophageal reflux disease) 08/14/2015  . Nausea with vomiting 08/14/2015  . Proximal humerus fracture 07/11/2015  . Allergic rhinitis 04/08/2015  . Distal radius fracture, left 01/19/2012    Class: Diagnosis of  . HLD (hyperlipidemia) 09/30/2010  . Essential hypertension 09/30/2010      Ammie Ferrier, SPT 08/22/2015  9:21 AM  [facility] [address] [place], [place], [address] Phone: [phone]   Fax:  [phone]  Name: SAFIATOU BARRITT MRN: [id number] Date of Birth: 09-Feb-1941

## 2015-08-22 NOTE — Patient Instructions (Addendum)
SHOULDER: External Rotation - Supine (Cane)  Hold cane with both hands. Rotate left arm away from body. Keep elbow on bed and next to body. __10_ reps per set, _2__ sets each, _3__ times a day. Add towel to keep elbow at side.  Copyright  VHI. All rights reserved.    Cane Exercise: Flexion   Lie on back, holding cane above chest. Keeping arms as straight as possible, lower cane toward floor beyond head. Hold __3__ seconds. Repeat __10__ times. Do _3___ sessions per day.  http://gt2.exer.us/91   Copyright  VHI. All rights reserved.   Cane Exercise: Abduction / Adduction    Hold cane palms down. Keeping back flat, move cane side to side over chest. Hold __2__ seconds each side. Repeat __20__ times. Do __3__ sessions per day.  http://gt2.exer.us/90   Copyright  VHI. All rights reserved.   Cane Exercise: Abduction    You will do this motion but lying down on your back. Hold cane with right hand over end, palm-up, with other hand palm-down. Move arm out from side and up by pushing with other arm. Hold _3___ seconds. Repeat __20__ times. Do _3___ sessions per day.  http://gt2.exer.us/82   Copyright  VHI. All rights reserved.

## 2015-08-25 ENCOUNTER — Ambulatory Visit: Payer: Medicare Other | Admitting: Physical Therapy

## 2015-08-25 DIAGNOSIS — R29898 Other symptoms and signs involving the musculoskeletal system: Secondary | ICD-10-CM | POA: Diagnosis not present

## 2015-08-25 DIAGNOSIS — S42202B Unspecified fracture of upper end of left humerus, initial encounter for open fracture: Secondary | ICD-10-CM | POA: Diagnosis not present

## 2015-08-25 DIAGNOSIS — M25512 Pain in left shoulder: Secondary | ICD-10-CM

## 2015-08-25 NOTE — Therapy (Signed)
[place], [place], [address] Phone: [phone]   Fax:  [phone]  Physical Therapy Treatment  Patient Details  Name: Amy Barron MRN: [id number] Date of Birth: 1940-10-17 Referring Provider: Lorin Mercy  Encounter Date: 08/25/2015      PT End of Session - 08/25/15 1059    Visit Number 4   Number of Visits 16   Date for PT Re-Evaluation 10/10/15   PT Start Time 0950   PT Stop Time 1040   PT Time Calculation (min) 50 min      Past Medical History  Diagnosis Date  . Hypertension   . Hyperlipidemia   . Stone, kidney     Past Surgical History  Procedure Laterality Date  . Abdominal hysterectomy  1998  . Parathyroidectomy    . Orif wrist fracture  01/18/2012    Procedure: OPEN REDUCTION INTERNAL FIXATION (ORIF) WRIST FRACTURE;  Surgeon: Marybelle Killings, MD;  Location: [place];  Service: Orthopedics;  Laterality: Left;  . Dilation and curettage of uterus    . Tubal ligation    . Colonoscopy w/ biopsies and polypectomy    . Multiple tooth extractions    . Orif humerus fracture Left 07/11/2015    Procedure: OPEN REDUCTION INTERNAL FIXATION (ORIF) PROXIMAL HUMERUS FRACTURE;  Surgeon: Marybelle Killings, MD;  Location: [place];  Service: Orthopedics;  Laterality: Left;    There were no vitals filed for this visit.  Visit Diagnosis:  Pain in joint of left shoulder  Proximal humerus fracture, left, open, initial encounter  Weakness of left arm      Subjective Assessment - 08/25/15 0953    Subjective 2/10 pain with making my own bed. I have not been doing that.    Currently in Pain? Yes   Pain Score 1    Pain Location Shoulder   Pain Orientation Left                         [facility] Adult PT Treatment/Exercise - 08/25/15 0001    Shoulder Exercises: Supine   Other Supine Exercises supine cane exercises reviewed per HEP- difficulty with abduction and flexion  cues to avoid much increase in  pain   Shoulder Exercises: Seated   Other Seated Exercises Table slides with cues for hip hinge, posture and scapular depression   Shoulder Exercises: Standing   Other Standing Exercises UE ranger on floor working up to 70-80 degrees flexion with focus on scapular depression with movement.    Shoulder Exercises: Pulleys   Flexion 2 minutes   Flexion Limitations cues for scapular depression   Manual Therapy   Manual Therapy Soft tissue mobilization   Soft tissue mobilization STM to upper trap, levator, deltoids, scar, pecs                 PT Education - 08/25/15 1053    Education provided Yes   Education Details levator and upper trap stretch,table slide, use of heat to upper traps, where to purchase pulleys   Person(s) Educated Patient   Methods Handout;Demonstration   Comprehension Verbalized understanding          PT Short Term Goals - 08/25/15 1247    PT SHORT TERM GOAL #1   Title Pt will be I with initial HEP for continued strengthening and mobility by 09/18/15.   Time 4   Period Weeks   Status On-going   PT SHORT TERM GOAL #2  Title L shoulder PROM will improve to 120 degrees pain-free for improved overhead reaching activities by 09/18/15.   Time 4   Period Weeks   Status On-going           PT Long Term Goals - 08/18/15 1257    PT LONG TERM GOAL #1   Title L shoulder strength flexion will improve to 3+/5 in order to lift 2# overhead x 10 reps without pain in order to return to pain-free functional activities for lifting overhead by 10/07/15.    Time 8   Period Weeks   Status New   PT LONG TERM GOAL #2   Title FOTO score will improve from 59% limited to <34% to demo improved function and mobility by 10/07/15.    Time 8   Period Weeks   Status New   PT LONG TERM GOAL #3   Title FER will improve to C7 in orfer for pt to wash her hair by 10/07/15.    Time 4   Period Weeks   Status New   PT LONG TERM GOAL #4   Title L shoulder flexion AROM will imrpove  to 120 degrees for overhead reaching by 10/07/15.    Time 8   Period Weeks   Status New               Plan - 08/25/15 1233    Clinical Impression Statement Review of supine cane exercises with pt reporting increase form 0/10 to 5/10 with pullovers. Educated pt to not increase pain signifcantly and try to keep the exercises in a comfortable ROM. Instructed pt in table slides for HEP with cues to relax and depress scapula. Education provided on importance of scapular stabilization with UE movements. Pt request to use pulleys again and information provided on wear to purchase over door pulleys for home use. Manual STM to upper traps and levator, deltoids, scar and pecs which pt report are tender. Recommended heat on upper traps at home to decrease muscle tension.    PT Next Visit Plan Review HEP prn; assess motion; scapula stabilizing exercises without resistance; did pt get pulley? review table slide         Problem List Patient Active Problem List   Diagnosis Date Noted  . GERD (gastroesophageal reflux disease) 08/14/2015  . Nausea with vomiting 08/14/2015  . Proximal humerus fracture 07/11/2015  . Allergic rhinitis 04/08/2015  . Distal radius fracture, left 01/19/2012    Class: Diagnosis of  . HLD (hyperlipidemia) 09/30/2010  . Essential hypertension 09/30/2010    [name], PTA 08/25/2015, 12:49 PM  [facility] [address] [place], [place], [address] Phone: [phone]   Fax:  [phone]  Name: Amy Barron MRN: [id number] Date of Birth: 06/06/1941

## 2015-08-25 NOTE — Patient Instructions (Signed)
Flexion (Passive)    Sitting upright, slide forearm forward along table, bending from the waist until a stretch is felt. Hold ___30_ seconds. Repeat _3___ times. Do ___2-3_ sessions per day.  Levator Stretch   Grasp seat or sit on hand on side to be stretched. Turn head toward other side and look down. Use hand on head to gently stretch neck in that position. Hold __30__ seconds. Repeat on other side. Repeat __3__ times. Do _2___ sessions per day.  http://gt2.exer.us/30   Copyright  VHI. All rights reserved.  Side-Bending  30One hand on opposite side of head, pull head to side as far as is comfortable. Stop if there is pain. Hold ____ seconds. Repeat with other hand to other side. Repeat _3___ times. Do _2___ sessions per day.

## 2015-08-27 ENCOUNTER — Ambulatory Visit: Payer: Medicare Other | Attending: Orthopaedic Surgery

## 2015-08-27 DIAGNOSIS — R29898 Other symptoms and signs involving the musculoskeletal system: Secondary | ICD-10-CM | POA: Diagnosis not present

## 2015-08-27 DIAGNOSIS — X58XXXA Exposure to other specified factors, initial encounter: Secondary | ICD-10-CM | POA: Diagnosis not present

## 2015-08-27 DIAGNOSIS — S42202B Unspecified fracture of upper end of left humerus, initial encounter for open fracture: Secondary | ICD-10-CM | POA: Diagnosis not present

## 2015-08-27 DIAGNOSIS — M25512 Pain in left shoulder: Secondary | ICD-10-CM

## 2015-08-27 NOTE — Therapy (Signed)
[place], [place], [address] Phone: [phone]   Fax:  [phone]  Physical Therapy Treatment  Patient Details  Name: Amy Barron MRN: [id number] Date of Birth: 1940/08/03 Referring Provider: Lorin Mercy  Encounter Date: 08/27/2015      PT End of Session - 08/27/15 1032    Visit Number 5   Number of Visits 16   Date for PT Re-Evaluation 10/10/15   PT Start Time [id number]   PT Stop Time 1110   PT Time Calculation (min) 55 min   Activity Tolerance Patient tolerated treatment well   Behavior During Therapy [facility] for tasks assessed/performed      Past Medical History  Diagnosis Date  . Hypertension   . Hyperlipidemia   . Stone, kidney     Past Surgical History  Procedure Laterality Date  . Abdominal hysterectomy  1998  . Parathyroidectomy    . Orif wrist fracture  01/18/2012    Procedure: OPEN REDUCTION INTERNAL FIXATION (ORIF) WRIST FRACTURE;  Surgeon: Marybelle Killings, MD;  Location: [place];  Service: Orthopedics;  Laterality: Left;  . Dilation and curettage of uterus    . Tubal ligation    . Colonoscopy w/ biopsies and polypectomy    . Multiple tooth extractions    . Orif humerus fracture Left 07/11/2015    Procedure: OPEN REDUCTION INTERNAL FIXATION (ORIF) PROXIMAL HUMERUS FRACTURE;  Surgeon: Marybelle Killings, MD;  Location: [place];  Service: Orthopedics;  Laterality: Left;    There were no vitals filed for this visit.  Visit Diagnosis:  Pain in joint of left shoulder  Proximal humerus fracture, left, open, initial encounter  Weakness of left arm      Subjective Assessment - 08/27/15 1015    Subjective Pt reports increased pain following exercises. Pt denies pain currently, she went to purchase pulleys, but they were $25, so she did not buy them. Pt c/o of tigling and pain into L 4th and 5th digit.    Currently in Pain? Yes   Pain Score 0-No pain   Pain Location Shoulder   Pain Orientation Left    Pain Descriptors / Indicators Tightness;Sore   Pain Type Surgical pain   Pain Onset 1 to 4 weeks ago                         [facility] Adult PT Treatment/Exercise - 08/27/15 0001    Shoulder Exercises: Seated   Row 20 reps;Both   Other Seated Exercises Table slides for flex, ER, and scaption/ ABD with cues for hip hinge, posture and scapular depression x 15 reps each   Other Seated Exercises UE ranger in sitting flex, scaption, ER x 20 reps each.    Shoulder Exercises: Standing   External Rotation AAROM;10 reps   External Rotation Weight (lbs) x 2 sets    External Rotation Limitations with PB on table and UE ranger x 10 reps each.    Internal Rotation AAROM;10 reps   Internal Rotation Weight (lbs) x 2 sets    Internal Rotation Limitations with PB on table and UE ranger x 10 reps each.    Flexion AAROM;10 reps;Other (comment)   Shoulder Flexion Weight (lbs) x 2 sets    Flexion Limitations with PB on table and UE ranger x 10 reps each.    ABduction --  increased pain, so discontinued AAROM with physioball   Retraction 20 reps   Retraction Limitations 5 sec  hold   Other Standing Exercises UE ranger on floor working up to 70-80 degrees flexion with focus on scapular depression with movement. x 10 reps   Shoulder Exercises: Pulleys   Flexion 2 minutes   Flexion Limitations cues for scapular depression   Cryotherapy   Number Minutes Cryotherapy 10 Minutes   Cryotherapy Location Shoulder   Type of Cryotherapy Ice pack   Manual Therapy   Manual Therapy Soft tissue mobilization   Joint Mobilization gentle joint distraction with oscillation    Soft tissue mobilization STM to upper trap, levator, deltoids, scar, pecs , sub scap and teres region   Passive ROM L shoulder PROM all directions                 PT Education - 08/27/15 1032    Education provided Yes   Education Details Cotninue AAROM with cane and table slides, within pain- free range.    Person(s)  Educated Patient   Methods Explanation;Demonstration;Handout   Comprehension Verbalized understanding          PT Short Term Goals - 08/25/15 1247    PT SHORT TERM GOAL #1   Title Pt will be I with initial HEP for continued strengthening and mobility by 09/18/15.   Time 4   Period Weeks   Status On-going   PT SHORT TERM GOAL #2   Title L shoulder PROM will improve to 120 degrees pain-free for improved overhead reaching activities by 09/18/15.   Time 4   Period Weeks   Status On-going           PT Long Term Goals - 08/18/15 1257    PT LONG TERM GOAL #1   Title L shoulder strength flexion will improve to 3+/5 in order to lift 2# overhead x 10 reps without pain in order to return to pain-free functional activities for lifting overhead by 10/07/15.    Time 8   Period Weeks   Status New   PT LONG TERM GOAL #2   Title FOTO score will improve from 59% limited to <34% to demo improved function and mobility by 10/07/15.    Time 8   Period Weeks   Status New   PT LONG TERM GOAL #3   Title FER will improve to C7 in orfer for pt to wash her hair by 10/07/15.    Time 4   Period Weeks   Status New   PT LONG TERM GOAL #4   Title L shoulder flexion AROM will imrpove to 120 degrees for overhead reaching by 10/07/15.    Time 8   Period Weeks   Status New               Plan - 08/27/15 1039    Clinical Impression Statement Performed AAROM ther ex trying to maintain pain-free movement and range. Pt demonstrated good understanding of table slides for home and performs 3 times a day. Increased pain with ABD with physioball on table, so discontinued. Pain-free  PROM improved significantly following STM .    PT Next Visit Plan Review HEP prn; assess motion; MT to improve pain-free range; scapula stabilizing exercises without resistance; did pt get pulley? Pain free AAROM.    PT Home Exercise Plan table slides    Consulted and Agree with Plan of Care Patient        Problem  List Patient Active Problem List   Diagnosis Date Noted  . GERD (gastroesophageal reflux disease) 08/14/2015  . Nausea with vomiting  08/14/2015  . Proximal humerus fracture 07/11/2015  . Allergic rhinitis 04/08/2015  . Distal radius fracture, left 01/19/2012    Class: Diagnosis of  . HLD (hyperlipidemia) 09/30/2010  . Essential hypertension 09/30/2010    [name], PT 08/27/2015, 1:09 PM  [facility] [address] [place], [place], [address] Phone: [phone]   Fax:  [phone]  Name: Amy Barron MRN: [id number] Date of Birth: July 31, 1940

## 2015-09-01 ENCOUNTER — Ambulatory Visit: Payer: Medicare Other | Admitting: Physical Therapy

## 2015-09-01 DIAGNOSIS — M25512 Pain in left shoulder: Secondary | ICD-10-CM

## 2015-09-01 DIAGNOSIS — R29898 Other symptoms and signs involving the musculoskeletal system: Secondary | ICD-10-CM

## 2015-09-01 DIAGNOSIS — S42202B Unspecified fracture of upper end of left humerus, initial encounter for open fracture: Secondary | ICD-10-CM

## 2015-09-01 NOTE — Therapy (Signed)
[place], [place], [address] Phone: [phone]   Fax:  [phone]  Physical Therapy Treatment  Patient Details  Name: Amy Barron MRN: [id number] Date of Birth: [birth year] Referring Provider: Lorin Mercy  Encounter Date: 09/01/2015      PT End of Session - 09/01/15 1005    Visit Number 6   Number of Visits 16   Date for PT Re-Evaluation 10/10/15   PT Start Time [id number]   PT Stop Time [id number]   PT Time Calculation (min) 45 min      Past Medical History  Diagnosis Date  . Hypertension   . Hyperlipidemia   . Stone, kidney     Past Surgical History  Procedure Laterality Date  . Abdominal hysterectomy  1998  . Parathyroidectomy    . Orif wrist fracture  01/18/2012    Procedure: OPEN REDUCTION INTERNAL FIXATION (ORIF) WRIST FRACTURE;  Surgeon: Marybelle Killings, MD;  Location: [place];  Service: Orthopedics;  Laterality: Left;  . Dilation and curettage of uterus    . Tubal ligation    . Colonoscopy w/ biopsies and polypectomy    . Multiple tooth extractions    . Orif humerus fracture Left 07/11/2015    Procedure: OPEN REDUCTION INTERNAL FIXATION (ORIF) PROXIMAL HUMERUS FRACTURE;  Surgeon: Marybelle Killings, MD;  Location: [place];  Service: Orthopedics;  Laterality: Left;    There were no vitals filed for this visit.  Visit Diagnosis:  Pain in joint of left shoulder  Proximal humerus fracture, left, open, initial encounter  Weakness of left arm      Subjective Assessment - 09/01/15 1005    Subjective Slept good last night.    Currently in Pain? No/denies            [facility] PT Assessment - 09/01/15 0001    AROM   Left Shoulder External Rotation 15 Degrees   PROM   Left Shoulder Flexion 90 Degrees  110 after manual/therex   Left Shoulder ABduction 80 Degrees   Left Shoulder Internal Rotation --  reach to left glute   Left Shoulder External Rotation 20 Degrees                     [facility]  Adult PT Treatment/Exercise - 09/01/15 0001    Shoulder Exercises: Standing   External Rotation AAROM;10 reps   External Rotation Limitations with cane   Internal Rotation AAROM;10 reps   Internal Rotation Limitations with cane and UE ranger behind back   Flexion AAROM;10 reps;Other (comment)   Shoulder Flexion Weight (lbs) x 2 sets with cane   Extension 10 reps;AAROM   Extension Limitations cane   Retraction 20 reps   Other Standing Exercises UE ranger on floor working up to 90 degrees flexion with focus on scapular depression with movement. x 10 reps   Shoulder Exercises: Pulleys   Flexion 2 minutes   Flexion Limitations prep and post with scap guidance   Shoulder Exercises: Stretch   Table Stretch - Flexion 10 seconds;5 reps   Table Stretch -Flexion Limitations cues for posture and scap depression   Table Stretch - External Rotation 1 rep   Table Stretch - External Rotation Limitations too painful   Manual Therapy   Joint Mobilization gentle joint distraction with oscillations, scapular mobs in sidelying, scapular guidance with shoulder movements    Passive ROM L shoulder PROM all directions  PT Short Term Goals - 08/25/15 1247    PT SHORT TERM GOAL #1   Title Pt will be I with initial HEP for continued strengthening and mobility by 09/18/15.   Time 4   Period Weeks   Status On-going   PT SHORT TERM GOAL #2   Title L shoulder PROM will improve to 120 degrees pain-free for improved overhead reaching activities by 09/18/15.   Time 4   Period Weeks   Status On-going           PT Long Term Goals - 08/18/15 1257    PT LONG TERM GOAL #1   Title L shoulder strength flexion will improve to 3+/5 in order to lift 2# overhead x 10 reps without pain in order to return to pain-free functional activities for lifting overhead by 10/07/15.    Time 8   Period Weeks   Status New   PT LONG TERM GOAL #2   Title FOTO score will improve from 59% limited to <34%  to demo improved function and mobility by 10/07/15.    Time 8   Period Weeks   Status New   PT LONG TERM GOAL #3   Title FER will improve to C7 in orfer for pt to wash her hair by 10/07/15.    Time 4   Period Weeks   Status New   PT LONG TERM GOAL #4   Title L shoulder flexion AROM will imrpove to 120 degrees for overhead reaching by 10/07/15.    Time 8   Period Weeks   Status New               Plan - 09/01/15 1058    Clinical Impression Statement Increased stiffness today at beginning of treatment for flexion. Improved after manual and therex. Pt demonstrates improved ER and abduction PROM. Progressing toward ROM goals.    PT Next Visit Plan Review HEP prn; assess motion; MT to improve pain-free range; scapula stabilizing exercises without resistance;Pain free AAROM.         Problem List Patient Active Problem List   Diagnosis Date Noted  . GERD (gastroesophageal reflux disease) 08/14/2015  . Nausea with vomiting 08/14/2015  . Proximal humerus fracture 07/11/2015  . Allergic rhinitis 04/08/2015  . Distal radius fracture, left 01/19/2012    Class: Diagnosis of  . HLD (hyperlipidemia) 09/30/2010  . Essential hypertension 09/30/2010    [name], PTA 09/01/2015, 2:29 PM  [facility] [address] [place], [place], [address] Phone: [phone]   Fax:  [phone]  Name: Amy Barron MRN: [id number] Date of Birth: 06-19-1941

## 2015-09-03 ENCOUNTER — Ambulatory Visit: Payer: Medicare Other

## 2015-09-03 DIAGNOSIS — S42202B Unspecified fracture of upper end of left humerus, initial encounter for open fracture: Secondary | ICD-10-CM

## 2015-09-03 DIAGNOSIS — M25512 Pain in left shoulder: Secondary | ICD-10-CM | POA: Diagnosis not present

## 2015-09-03 DIAGNOSIS — R29898 Other symptoms and signs involving the musculoskeletal system: Secondary | ICD-10-CM

## 2015-09-03 NOTE — Therapy (Signed)
[place], [place], [address] Phone: [phone]   Fax:  [phone]  Physical Therapy Treatment  Patient Details  Name: Amy Barron MRN: [id number] Date of Birth: 1941/03/25 Referring Provider: Lorin Mercy  Encounter Date: 09/03/2015      PT End of Session - 09/03/15 1030    Visit Number 7   Number of Visits 16   Date for PT Re-Evaluation 10/10/15   PT Start Time 1019   PT Stop Time 1110   PT Time Calculation (min) 51 min   Activity Tolerance Patient tolerated treatment well   Behavior During Therapy [facility] for tasks assessed/performed      Past Medical History  Diagnosis Date  . Hypertension   . Hyperlipidemia   . Stone, kidney     Past Surgical History  Procedure Laterality Date  . Abdominal hysterectomy  1998  . Parathyroidectomy    . Orif wrist fracture  01/18/2012    Procedure: OPEN REDUCTION INTERNAL FIXATION (ORIF) WRIST FRACTURE;  Surgeon: Marybelle Killings, MD;  Location: [place];  Service: Orthopedics;  Laterality: Left;  . Dilation and curettage of uterus    . Tubal ligation    . Colonoscopy w/ biopsies and polypectomy    . Multiple tooth extractions    . Orif humerus fracture Left 07/11/2015    Procedure: OPEN REDUCTION INTERNAL FIXATION (ORIF) PROXIMAL HUMERUS FRACTURE;  Surgeon: Marybelle Killings, MD;  Location: [place];  Service: Orthopedics;  Laterality: Left;    There were no vitals filed for this visit.  Visit Diagnosis:  Pain in joint of left shoulder  Proximal humerus fracture, left, open, initial encounter  Weakness of left arm      Subjective Assessment - 09/03/15 1028    Subjective Pain-free today. Slept well. No complaints. Pt reports compliance with HEP .    Currently in Pain? No/denies   Pain Score 0-No pain   Pain Location Shoulder   Pain Orientation Left            [facility] PT Assessment - 09/03/15 0001    PROM   Left Shoulder Flexion 122 Degrees                      [facility] Adult PT Treatment/Exercise - 09/03/15 0001    Shoulder Exercises: Supine   External Rotation AAROM;Left;20 reps  cane   Flexion AAROM;Both;10 reps   Flexion Limitations cane   Shoulder Exercises: Seated   Other Seated Exercises Table slides for flex, ER, and scaption/ ABD with cues for hip hinge, posture and scapular depression x 15 reps each   Shoulder Exercises: Standing   External Rotation AAROM;10 reps   External Rotation Weight (lbs) x 2 physioball    Flexion AAROM;10 reps;Other (comment)   Shoulder Flexion Weight (lbs) x 2 sets with cane  physioball   Other Standing Exercises UE ranger on floor working up to 90 degrees flexion with focus on scapular depression with movement. x 10 reps   Shoulder Exercises: Pulleys   Flexion 2 minutes   Cryotherapy   Number Minutes Cryotherapy 10 Minutes   Cryotherapy Location Shoulder   Type of Cryotherapy Ice pack   Manual Therapy   Joint Mobilization gentle joint distraction with oscillations, scapular mobs in sidelying, scapular guidance with shoulder movements    Soft tissue mobilization STM to upper trap, levator, deltoids, scar, pecs , sub scap and teres region   Passive ROM L shoulder PROM all directions  PT Short Term Goals - 09/03/15 1040    PT SHORT TERM GOAL #1   Title Pt will be I with initial HEP for continued strengthening and mobility by 09/18/15.   Time 4   Period Weeks   Status On-going   PT SHORT TERM GOAL #2   Title L shoulder PROM will improve to 120 degrees pain-free for improved overhead reaching activities by 09/18/15.   Time 4   Period Weeks   Status On-going           PT Long Term Goals - 09/03/15 1040    PT LONG TERM GOAL #1   Title L shoulder strength flexion will improve to 3+/5 in order to lift 2# overhead x 10 reps without pain in order to return to pain-free functional activities for lifting overhead by 10/07/15.    Time 8   Period  Weeks   Status On-going   PT LONG TERM GOAL #2   Title FOTO score will improve from 59% limited to <34% to demo improved function and mobility by 10/07/15.    Time 8   Period Weeks   Status On-going   PT LONG TERM GOAL #3   Title FER will improve to C7 in orfer for pt to wash her hair by 10/07/15.    Time 4   Period Weeks   Status On-going   PT LONG TERM GOAL #4   Title L shoulder flexion AROM will imrpove to 120 degrees for overhead reaching by 10/07/15.    Time 8   Status On-going               Plan - 09/03/15 1031    Clinical Impression Statement Improved mobility with AAROM exercises.  Pt reports "catching" with supine cane flexion, but was improved today, with less catching.    PT Next Visit Plan Review HEP prn; assess motion; MT to improve pain-free range; scapula stabilizing exercises without resistance;Pain free AAROM.    Consulted and Agree with Plan of Care Patient        Problem List Patient Active Problem List   Diagnosis Date Noted  . GERD (gastroesophageal reflux disease) 08/14/2015  . Nausea with vomiting 08/14/2015  . Proximal humerus fracture 07/11/2015  . Allergic rhinitis 04/08/2015  . Distal radius fracture, left 01/19/2012    Class: Diagnosis of  . HLD (hyperlipidemia) 09/30/2010  . Essential hypertension 09/30/2010    [name], PT 09/03/2015, 12:55 PM  [facility] [address] [place], [place], [address] Phone: [phone]   Fax:  [phone]  Name: Amy Barron MRN: [id number] Date of Birth: Oct 28, 1940

## 2015-09-05 ENCOUNTER — Ambulatory Visit: Payer: Medicare Other | Admitting: Physical Therapy

## 2015-09-05 DIAGNOSIS — S42202B Unspecified fracture of upper end of left humerus, initial encounter for open fracture: Secondary | ICD-10-CM

## 2015-09-05 DIAGNOSIS — M25512 Pain in left shoulder: Secondary | ICD-10-CM | POA: Diagnosis not present

## 2015-09-05 DIAGNOSIS — R29898 Other symptoms and signs involving the musculoskeletal system: Secondary | ICD-10-CM

## 2015-09-05 NOTE — Therapy (Signed)
[place], [place], [address] Phone: [phone]   Fax:  [phone]  Physical Therapy Treatment  Patient Details  Name: Amy Barron MRN: [id number] Date of Birth: 1940/09/03 Referring Provider: Lorin Mercy  Encounter Date: 09/05/2015      PT End of Session - 09/05/15 1138    Visit Number 8   Number of Visits 16   Date for PT Re-Evaluation 10/10/15   PT Start Time 1101   PT Stop Time 1154   PT Time Calculation (min) 53 min      Past Medical History  Diagnosis Date  . Hypertension   . Hyperlipidemia   . Stone, kidney     Past Surgical History  Procedure Laterality Date  . Abdominal hysterectomy  1998  . Parathyroidectomy    . Orif wrist fracture  01/18/2012    Procedure: OPEN REDUCTION INTERNAL FIXATION (ORIF) WRIST FRACTURE;  Surgeon: Marybelle Killings, MD;  Location: [place];  Service: Orthopedics;  Laterality: Left;  . Dilation and curettage of uterus    . Tubal ligation    . Colonoscopy w/ biopsies and polypectomy    . Multiple tooth extractions    . Orif humerus fracture Left 07/11/2015    Procedure: OPEN REDUCTION INTERNAL FIXATION (ORIF) PROXIMAL HUMERUS FRACTURE;  Surgeon: Marybelle Killings, MD;  Location: [place];  Service: Orthopedics;  Laterality: Left;    There were no vitals filed for this visit.  Visit Diagnosis:  Pain in joint of left shoulder  Proximal humerus fracture, left, open, initial encounter  Weakness of left arm      Subjective Assessment - 09/05/15 1107    Subjective I woke up with it hurting today so took 2 tylenol and iced it. No pain now.   Currently in Pain? No/denies   Aggravating Factors  sleeping wrong   Pain Relieving Factors tylenol and ice                         [facility] Adult PT Treatment/Exercise - 09/05/15 0001    Shoulder Exercises: Supine   Other Supine Exercises Supine cane exercises all 2 x 10 each monitoring pain response   Shoulder  Exercises: Pulleys   Flexion 2 minutes   Cryotherapy   Number Minutes Cryotherapy 10 Minutes   Cryotherapy Location Shoulder   Type of Cryotherapy Ice pack   Manual Therapy   Joint Mobilization gentle joint distraction with oscillations, scapular mobs in sidelying, scapular guidance with shoulder movements    Soft tissue mobilization STM to upper trap, levator, deltoids, scar, pecs , sub scap and teres region   Passive ROM L shoulder PROM all directions                   PT Short Term Goals - 09/03/15 1040    PT SHORT TERM GOAL #1   Title Pt will be I with initial HEP for continued strengthening and mobility by 09/18/15.   Time 4   Period Weeks   Status On-going   PT SHORT TERM GOAL #2   Title L shoulder PROM will improve to 120 degrees pain-free for improved overhead reaching activities by 09/18/15.   Time 4   Period Weeks   Status On-going           PT Long Term Goals - 09/03/15 1040    PT LONG TERM GOAL #1   Title L shoulder strength flexion will improve to 3+/5  in order to lift 2# overhead x 10 reps without pain in order to return to pain-free functional activities for lifting overhead by 10/07/15.    Time 8   Period Weeks   Status On-going   PT LONG TERM GOAL #2   Title FOTO score will improve from 59% limited to <34% to demo improved function and mobility by 10/07/15.    Time 8   Period Weeks   Status On-going   PT LONG TERM GOAL #3   Title FER will improve to C7 in orfer for pt to wash her hair by 10/07/15.    Time 4   Period Weeks   Status On-going   PT LONG TERM GOAL #4   Title L shoulder flexion AROM will imrpove to 120 degrees for overhead reaching by 10/07/15.    Time 8   Status On-going               Plan - 09/05/15 1123    Clinical Impression Statement pt reports increased pain this am after sleeping all  night on right side. PROM flexion less tolerated today as well as more pain than usual for AAROM flexion. Manual techniques to improve  scapular rhythm and ROM to tolerance with 2-3/10 pain during flexion exercises. Closely monitoring pain response throughout treatment. No additional goals met due to pain exacerbation.    PT Next Visit Plan Review HEP prn; assess motion; MT to improve pain-free range; scapula stabilizing exercises without resistance;Pain free AAROM.         Problem List Patient Active Problem List   Diagnosis Date Noted  . GERD (gastroesophageal reflux disease) 08/14/2015  . Nausea with vomiting 08/14/2015  . Proximal humerus fracture 07/11/2015  . Allergic rhinitis 04/08/2015  . Distal radius fracture, left 01/19/2012    Class: Diagnosis of  . HLD (hyperlipidemia) 09/30/2010  . Essential hypertension 09/30/2010    [name], PTA 09/05/2015, 12:09 PM  [facility] [address] [place], [place], [address] Phone: [phone]   Fax:  [phone]  Name: Amy Barron MRN: [id number] Date of Birth: 23-Nov-1940

## 2015-09-08 ENCOUNTER — Ambulatory Visit: Payer: Medicare Other | Admitting: Physical Therapy

## 2015-09-08 DIAGNOSIS — M25512 Pain in left shoulder: Secondary | ICD-10-CM | POA: Diagnosis not present

## 2015-09-08 DIAGNOSIS — S42202B Unspecified fracture of upper end of left humerus, initial encounter for open fracture: Secondary | ICD-10-CM | POA: Diagnosis not present

## 2015-09-08 DIAGNOSIS — R29898 Other symptoms and signs involving the musculoskeletal system: Secondary | ICD-10-CM

## 2015-09-09 DIAGNOSIS — S42252D Displaced fracture of greater tuberosity of left humerus, subsequent encounter for fracture with routine healing: Secondary | ICD-10-CM | POA: Diagnosis not present

## 2015-09-09 NOTE — Therapy (Signed)
[place], [place], [address] Phone: [phone]   Fax:  [phone]  Physical Therapy Treatment  Patient Details  Name: Amy Barron MRN: [id number] Date of Birth: Jun 20, 1941 Referring Provider: Lorin Mercy  Encounter Date: 09/08/2015      PT End of Session - 09/08/15 0841    Visit Number 9   Number of Visits 16   Date for PT Re-Evaluation 10/10/15   PT Start Time [id number]   PT Stop Time 1115   PT Time Calculation (min) 60 min      Past Medical History  Diagnosis Date  . Hypertension   . Hyperlipidemia   . Stone, kidney     Past Surgical History  Procedure Laterality Date  . Abdominal hysterectomy  1998  . Parathyroidectomy    . Orif wrist fracture  01/18/2012    Procedure: OPEN REDUCTION INTERNAL FIXATION (ORIF) WRIST FRACTURE;  Surgeon: Marybelle Killings, MD;  Location: [place];  Service: Orthopedics;  Laterality: Left;  . Dilation and curettage of uterus    . Tubal ligation    . Colonoscopy w/ biopsies and polypectomy    . Multiple tooth extractions    . Orif humerus fracture Left 07/11/2015    Procedure: OPEN REDUCTION INTERNAL FIXATION (ORIF) PROXIMAL HUMERUS FRACTURE;  Surgeon: Marybelle Killings, MD;  Location: [facility];  Service: Orthopedics;  Laterality: Left;    There were no vitals filed for this visit.  Visit Diagnosis:  Pain in joint of left shoulder  Proximal humerus fracture, left, open, initial encounter  Weakness of left arm      Subjective Assessment - 09/08/15 0840    Subjective Its better today. I pet it.    Currently in Pain? No/denies            [facility] PT Assessment - 09/09/15 0001    PROM   Left Shoulder Flexion 125 Degrees   Left Shoulder ABduction 100 Degrees   Left Shoulder Internal Rotation 50 Degrees   Left Shoulder External Rotation 25 Degrees                     [facility] Adult PT Treatment/Exercise - 09/09/15 0001    Shoulder Exercises: Supine   Other  Supine Exercises Focued ER AAROM with proper technique to ensure elbow stays at 90 degrees 10 sec x 10   Shoulder Exercises: Pulleys   Flexion 2 minutes   Shoulder Exercises: ROM/Strengthening   Other ROM/Strengthening Exercises Wall ladder for flexion and cues for scapula depession   Other ROM/Strengthening Exercises Standing UE ranger exercises for flexion , aduction , ER, IR, extension x 1o each. Also IR AAROM with step x 10      Ice pack x 15 minutes left shoulder             PT Short Term Goals - 09/08/15 0839    PT SHORT TERM GOAL #1   Title Pt will be I with initial HEP for continued strengthening and mobility by 09/18/15.   Time 4   Period Weeks   Status On-going   PT SHORT TERM GOAL #2   Title L shoulder PROM will improve to 120 degrees pain-free for improved overhead reaching activities by 09/18/15.   Time 4   Period Weeks   Status On-going           PT Long Term Goals - 09/09/15 0839    PT LONG TERM GOAL #1   Title L  shoulder strength flexion will improve to 3+/5 in order to lift 2# overhead x 10 reps without pain in order to return to pain-free functional activities for lifting overhead by 10/07/15.    Baseline AAROM only   Time 8   Period Weeks   Status On-going   PT LONG TERM GOAL #2   Title FOTO score will improve from 59% limited to <34% to demo improved function and mobility by 10/07/15.    Time 8   Period Weeks   Status On-going   PT LONG TERM GOAL #3   Title FER will improve to C7 in orfer for pt to wash her hair by 10/07/15.    Baseline AAROM only   Time 4   Period Weeks   Status On-going   PT LONG TERM GOAL #4   Title L shoulder flexion AROM will imrpove to 120 degrees for overhead reaching by 10/07/15.    Baseline AAROM only   Time 8   Period Weeks   Status On-going               Plan - 09/08/15 1040    Clinical Impression Statement Pt reports improvement in pain since last visit. Instructed in standing and supine AAROM exercises  with good tolerance. ER limited to 25 degrees. Review of AAROM with cane with focus on keeping elbow flexed. Pt reports at home she can get her hand all the way to her bed however pt has been extending elbow. Tactile and verbal cues provided on proper positioning and technique to maximize ROM.    PT Next Visit Plan Review ER AAROM, manual for ER ROM; what did MD say?         Problem List Patient Active Problem List   Diagnosis Date Noted  . GERD (gastroesophageal reflux disease) 08/14/2015  . Nausea with vomiting 08/14/2015  . Proximal humerus fracture 07/11/2015  . Allergic rhinitis 04/08/2015  . Distal radius fracture, left 01/19/2012    Class: Diagnosis of  . HLD (hyperlipidemia) 09/30/2010  . Essential hypertension 09/30/2010    [name] , PTA  09/09/2015, 8:45 AM  [facility] [address] [place], [place], [address] Phone: [phone]   Fax:  [phone]  Name: TERELL LUBRANO MRN: [id number] Date of Birth: 13-Aug-1940

## 2015-09-10 ENCOUNTER — Ambulatory Visit: Payer: Medicare Other | Admitting: Physical Therapy

## 2015-09-11 ENCOUNTER — Ambulatory Visit: Payer: Medicare Other | Admitting: Physical Therapy

## 2015-09-15 ENCOUNTER — Ambulatory Visit: Payer: Medicare Other | Admitting: Physical Therapy

## 2015-09-17 ENCOUNTER — Ambulatory Visit: Payer: Medicare Other | Admitting: Physical Therapy

## 2015-09-17 DIAGNOSIS — S42202B Unspecified fracture of upper end of left humerus, initial encounter for open fracture: Secondary | ICD-10-CM | POA: Diagnosis not present

## 2015-09-17 DIAGNOSIS — R29898 Other symptoms and signs involving the musculoskeletal system: Secondary | ICD-10-CM | POA: Diagnosis not present

## 2015-09-17 DIAGNOSIS — M25512 Pain in left shoulder: Secondary | ICD-10-CM | POA: Diagnosis not present

## 2015-09-17 NOTE — Therapy (Signed)
[place], [place], [address] Phone: [phone]   Fax:  [phone]  Physical Therapy Treatment  Patient Details  Name: Amy Barron MRN: [id number] Date of Birth: [birth year] Referring Provider: Lorin Mercy  Encounter Date: 09/17/2015      PT End of Session - 09/17/15 1010    Visit Number 10   Number of Visits 16   Date for PT Re-Evaluation 10/10/15   PT Start Time 1008   PT Stop Time 1101   PT Time Calculation (min) 53 min      Past Medical History  Diagnosis Date  . Hypertension   . Hyperlipidemia   . Stone, kidney     Past Surgical History  Procedure Laterality Date  . Abdominal hysterectomy  1998  . Parathyroidectomy    . Orif wrist fracture  01/18/2012    Procedure: OPEN REDUCTION INTERNAL FIXATION (ORIF) WRIST FRACTURE;  Surgeon: Marybelle Killings, MD;  Location: [place];  Service: Orthopedics;  Laterality: Left;  . Dilation and curettage of uterus    . Tubal ligation    . Colonoscopy w/ biopsies and polypectomy    . Multiple tooth extractions    . Orif humerus fracture Left 07/11/2015    Procedure: OPEN REDUCTION INTERNAL FIXATION (ORIF) PROXIMAL HUMERUS FRACTURE;  Surgeon: Marybelle Killings, MD;  Location: [place];  Service: Orthopedics;  Laterality: Left;    There were no vitals filed for this visit.  Visit Diagnosis:  Pain in joint of left shoulder  Proximal humerus fracture, left, open, initial encounter  Weakness of left arm      Subjective Assessment - 09/17/15 1015    Subjective My doctor said I did not have to continue PT but I want to keep coming.    Currently in Pain? No/denies   Aggravating Factors  putting on deodorant            [facility] PT Assessment - 09/17/15 1019    AROM   Left Shoulder Flexion 90 Degrees   Left Shoulder ABduction 82 Degrees   Left Shoulder Internal Rotation --  reach to buttock   Left Shoulder External Rotation --  reach to C-8                      [facility] Adult PT Treatment/Exercise - 09/17/15 0001    Self-Care   Self-Care Other Self-Care Comments   Other Self-Care Comments  Priced overhead pulleys for patient -gave info and direction to store.    Shoulder Exercises: Supine   Flexion AROM;10 reps;20 reps   Shoulder Exercises: Sidelying   External Rotation AROM;20 reps   ABduction 20 reps   Shoulder Exercises: Standing   External Rotation Left;20 reps;Theraband   Theraband Level (Shoulder External Rotation) Level 1 (Yellow)   Internal Rotation Left;20 reps;Theraband   Theraband Level (Shoulder Internal Rotation) Level 1 (Yellow)   Flexion AAROM;10 reps;Other (comment)   Shoulder Flexion Weight (lbs) x 2 sets with cane   ABduction AAROM   ABduction Limitations x 2 sets with cane  mirror feed back   Row 20 reps;Theraband   Theraband Level (Shoulder Row) Level 1 (Yellow)   Other Standing Exercises standing cane pullovers with mirror feedback x10 x2   Shoulder Exercises: Pulleys   Flexion 3 minutes                PT Education - 09/17/15 1119    Education provided Yes   Education Details continue [place],  yellow band rows and IR/ER ; where to purchase pulleys   Person(s) Educated Patient   Methods Explanation;Handout   Comprehension Verbalized understanding          PT Short Term Goals - 09/17/15 1113    PT SHORT TERM GOAL #1   Title Pt will be I with initial HEP for continued strengthening and mobility by 09/18/15.   Status Achieved   PT SHORT TERM GOAL #2   Title L shoulder PROM will improve to 120 degrees pain-free for improved overhead reaching activities by 09/18/15.   Time 4   Period Weeks   Status On-going           PT Long Term Goals - 09/17/15 1113    PT LONG TERM GOAL #1   Title L shoulder strength flexion will improve to 3+/5 in order to lift 2# overhead x 10 reps without pain in order to return to pain-free functional activities for lifting overhead by 10/07/15.     Period Weeks   Status On-going   PT LONG TERM GOAL #2   Title FOTO score will improve from 59% limited to <34% to demo improved function and mobility by 10/07/15.    Time 8   Period Weeks   Status On-going   PT LONG TERM GOAL #3   Title FER will improve to C7 in orfer for pt to wash her hair by 10/07/15.    Time 4   Period Weeks   Status Achieved   PT LONG TERM GOAL #4   Title L shoulder flexion AROM will imrpove to 120 degrees for overhead reaching by 10/07/15.    Time 8   Period Weeks   Status On-going               Plan - 09/17/15 1016    Clinical Impression Statement Pt presents to PT with no pain. Per recent MD visit, she reports she has no restrictions other than no lifting over 20#. She would like to regain full function in her LUE. Began AROM and light resistive bands without inreased pain. Added to HEP. Towel used to keep shoulder neutral with ER/.IR bands. Pt given prie and info on where to purchase cheap pulleys for home ROM. She is able to reach C-7 and can wash her hair using LUE> She is unable to tease her hair using LUE. Her flexion and abduction are limited to </= 90 degrees and she has increased pain with PROM. STG#1 met. LTG#3 met.    PT Next Visit Plan AROM, isometrics, gentle resistive exercises pain free, continue overhead PROM /AAROM. Did pt get pulleys?        Problem List Patient Active Problem List   Diagnosis Date Noted  . GERD (gastroesophageal reflux disease) 08/14/2015  . Nausea with vomiting 08/14/2015  . Proximal humerus fracture 07/11/2015  . Allergic rhinitis 04/08/2015  . Distal radius fracture, left 01/19/2012    Class: Diagnosis of  . HLD (hyperlipidemia) 09/30/2010  . Essential hypertension 09/30/2010    Amy Barron, PTA 09/17/2015, 11:26 AM  [facility] [address] [place], [place], [address] Phone: [phone]   Fax:  [phone]  Name: Amy Barron MRN:  [id number] Date of Birth: 05-Oct-1940

## 2015-09-17 NOTE — Patient Instructions (Signed)
Strengthening: Resisted Internal Rotation   Hold tubing in left hand, elbow at side and forearm out. Rotate forearm in across body. Repeat __10__ times per set. Do __2__ sets per session. Do __2_ sessions per day.  http://orth.exer.us/830   Copyright  VHI. All rights reserved.  Strengthening: Resisted External Rotation   Hold tubing in right hand, elbow at side and forearm across body. Rotate forearm out. Repeat __10__ times per set. Do __2__ sets per session. Do _2___ sessions per day.   Resistive Band Rowing   With resistive band anchored in door, grasp both ends. Keeping elbows bent, pull back, squeezing shoulder blades together. Hold _5___ seconds. Repeat _10___ times. Do _2___ sessions per day.  http://gt2.exer.us/97   Copyright  VHI. All rights reserved.

## 2015-09-19 ENCOUNTER — Encounter: Payer: No Typology Code available for payment source | Admitting: Physical Therapy

## 2015-09-22 ENCOUNTER — Ambulatory Visit: Payer: Medicare Other

## 2015-09-22 DIAGNOSIS — R29898 Other symptoms and signs involving the musculoskeletal system: Secondary | ICD-10-CM | POA: Diagnosis not present

## 2015-09-22 DIAGNOSIS — S42202B Unspecified fracture of upper end of left humerus, initial encounter for open fracture: Secondary | ICD-10-CM

## 2015-09-22 DIAGNOSIS — M25512 Pain in left shoulder: Secondary | ICD-10-CM | POA: Diagnosis not present

## 2015-09-22 NOTE — Therapy (Signed)
Amy Barron, [place], [address] Phone: [phone]   Fax:  [phone]  Physical Therapy Treatment and Progress Note  Patient Details  Name: Amy Barron MRN: [id number] Date of Birth: [birth year] Referring Provider: Lorin Mercy  Encounter Date: 09/22/2015      PT End of Session - 09/22/15 1434    Visit Number 11   Number of Visits 16   Date for PT Re-Evaluation 10/10/15   PT Start Time 2458   PT Stop Time 1100   PT Time Calculation (min) 45 min   Activity Tolerance Patient tolerated treatment well   Behavior During Therapy [facility] for tasks assessed/performed      Past Medical History  Diagnosis Date  . Hypertension   . Hyperlipidemia   . Stone, kidney     Past Surgical History  Procedure Laterality Date  . Abdominal hysterectomy  1998  . Parathyroidectomy    . Orif wrist fracture  01/18/2012    Procedure: OPEN REDUCTION INTERNAL FIXATION (ORIF) WRIST FRACTURE;  Surgeon: Marybelle Killings, MD;  Location: [place];  Service: Orthopedics;  Laterality: Left;  . Dilation and curettage of uterus    . Tubal ligation    . Colonoscopy w/ biopsies and polypectomy    . Multiple tooth extractions    . Orif humerus fracture Left 07/11/2015    Procedure: OPEN REDUCTION INTERNAL FIXATION (ORIF) PROXIMAL HUMERUS FRACTURE;  Surgeon: Marybelle Killings, MD;  Location: [facility];  Service: Orthopedics;  Laterality: Left;    There were no vitals filed for this visit.  Visit Diagnosis:  Pain in joint of left shoulder  Proximal humerus fracture, left, open, initial encounter  Weakness of left arm      Subjective Assessment - 09/22/15 1022    Subjective PROGRESS NOTE: Pt reports she feels 60% improved since beginning PT following humerus fracture with ORIF with Dr Lorin Mercy. Pt notes she is now able to don deoderant with less difficulty and reach behind her head. Pt denies pain, but still has difficulty reaching overhead,  carrying and  lifting, reaching behind head,    Patient Stated Goals Return to yoga, grooming hair, cleaning house.     Currently in Pain? No/denies   Pain Score 0-No pain            [facility] PT Assessment - 09/22/15 0001    AROM   Left Shoulder Flexion 90 Degrees   Left Shoulder ABduction 82 Degrees   Left Shoulder Internal Rotation --  reach to buttock   Left Shoulder External Rotation --  reach to C-8   PROM   Left Shoulder Flexion 120 Degrees   Left Shoulder ABduction 100 Degrees   Left Shoulder Internal Rotation 70 Degrees  @ 45 degrees ABD   Left Shoulder External Rotation 25 Degrees  @ 45 degrees ABD   Strength   Left Shoulder Flexion 2-/5   Left Shoulder Extension 3/5   Left Shoulder ABduction 2-/5   Left Shoulder Internal Rotation 3/5   Left Shoulder External Rotation 2+/5                     [facility] Adult PT Treatment/Exercise - 09/22/15 0001    Shoulder Exercises: Standing   Flexion AROM;5 reps   ABduction AROM;5 reps   Shoulder Exercises: Pulleys   Flexion 3 minutes   Shoulder Exercises: Stretch   Table Stretch - Flexion 5 reps;10 seconds   Table Stretch - External Rotation 3 reps;30  seconds   Table Stretch - External Rotation Limitations HEP   Modalities   Modalities Moist Heat   Moist Heat Therapy   Number Minutes Moist Heat 10 Minutes   Moist Heat Location Shoulder   Manual Therapy   Joint Mobilization gentle joint distraction with oscillations, inf/post grade 1-3 glenohumeral joint mobs   Soft tissue mobilization STM to upper trap, levator, deltoids, scar, pecs , sub scap and teres region   Passive ROM L shoulder PROM all directions                 PT Education - 09/22/15 1449    Education provided Yes   Education Details Table ER stretch   Person(s) Educated Patient   Methods Explanation;Handout   Comprehension Verbalized understanding          PT Short Term Goals - 09/22/15 1037    PT SHORT TERM GOAL #1   Title Pt will be I with  initial HEP for continued strengthening and mobility by 09/18/15.   Time 4   Period Weeks   Status Achieved   PT SHORT TERM GOAL #2   Title L shoulder PROM will improve to 120 degrees pain-free for improved overhead reaching activities by 09/18/15.   Time 4   Period Weeks   Status On-going           PT Long Term Goals - 09/22/15 1037    PT LONG TERM GOAL #1   Title L shoulder strength flexion will improve to 3+/5 in order to lift 2# overhead x 10 reps without pain in order to return to pain-free functional activities for lifting overhead by 10/07/15.    Time 8   Period Weeks   Status On-going   PT LONG TERM GOAL #2   Title FOTO score will improve from 59% limited to <34% to demo improved function and mobility by 10/07/15.    Baseline 09/22/15: FOTO score improved from 59% at intake to  44%. Predicted 28%.    Time 8   Period Weeks   Status On-going   PT LONG TERM GOAL #3   Title FER will improve to C7 in orfer for pt to wash her hair by 10/07/15.    Baseline 09/22/15: Unable to reach top of head to wash hair    Time 4   Period Weeks   Status On-going   PT LONG TERM GOAL #4   Title L shoulder flexion AROM will imrpove to 120 degrees for overhead reaching by 10/07/15.    Time 8   Period Weeks   Status On-going               Plan - 09/22/15 1439    Clinical Impression Statement Pt has attended 11 visits of oupt PT since ORIF following humerus fx on 07/10/16 by Dr Lorin Mercy. Pt has made good progress with goals although only HEP goal is met. Pt continues to be limited in PROM and AROM and has not achieved goals associated with strength or range, which limits pt's ability to perform functional activities.FOTO score improved from 59% 44%. Pt would benefit from continued PT for 2 times a week (decreased from 3x a week) for POC to address remaining impairments and functional limitations and achieve goals.     PT Frequency 2x / week   PT Next Visit Plan Review HEP prescribed (table ER).   AROM, isometrics, gentle resistive exercises pain free, continue overhead PROM /AAROM. Did pt get pulleys?   PT Home Exercise  Plan Review HEP prescribed (table ER)    Consulted and Agree with Plan of Care Patient          G-Codes - 2015/10/02 1432    Functional Assessment Tool Used FOTO   Functional Limitation Carrying, moving and handling objects   Carrying, Moving and Handling Objects Current Status [phone]) At least 40 percent but less than 60 percent impaired, limited or restricted   Carrying, Moving and Handling Objects Goal Status ([id number]) At least 1 percent but less than 20 percent impaired, limited or restricted      Problem List Patient Active Problem List   Diagnosis Date Noted  . GERD (gastroesophageal reflux disease) 08/14/2015  . Nausea with vomiting 08/14/2015  . Proximal humerus fracture 07/11/2015  . Allergic rhinitis 04/08/2015  . Distal radius fracture, left 01/19/2012    Class: Diagnosis of  . HLD (hyperlipidemia) 09/30/2010  . Essential hypertension 09/30/2010   Physical Therapy Progress Note  Dates of Reporting Period: 08/18/15 to 10/02/15  Objective Reports of Subjective Statement: see above  Objective Measurements: see above  Goal Update: none  Plan: Decrease frequency from 3 times a week to 2 times a week and conitnue for remainder of POC. (until 10/10/15).   Reason Skilled Services are Required: limited PROM, AROM, strength, and difficulty with functional activities.      [name], PT Oct 02, 2015, 2:59 PM  [facility] [address] [place], [place], [address] Phone: [phone]   Fax:  [phone]  Name: Amy Barron MRN: [id number] Date of Birth: 19-Nov-1940

## 2015-09-26 ENCOUNTER — Encounter: Payer: No Typology Code available for payment source | Admitting: Physical Therapy

## 2015-09-29 ENCOUNTER — Ambulatory Visit: Payer: Medicare Other | Attending: Orthopaedic Surgery | Admitting: Physical Therapy

## 2015-09-29 DIAGNOSIS — X58XXXA Exposure to other specified factors, initial encounter: Secondary | ICD-10-CM | POA: Insufficient documentation

## 2015-09-29 DIAGNOSIS — M25512 Pain in left shoulder: Secondary | ICD-10-CM | POA: Insufficient documentation

## 2015-09-29 DIAGNOSIS — S42202B Unspecified fracture of upper end of left humerus, initial encounter for open fracture: Secondary | ICD-10-CM | POA: Diagnosis not present

## 2015-09-29 DIAGNOSIS — R29898 Other symptoms and signs involving the musculoskeletal system: Secondary | ICD-10-CM | POA: Insufficient documentation

## 2015-09-29 NOTE — Therapy (Signed)
[place], [place], [address] Phone: [phone]   Fax:  [phone]  Physical Therapy Treatment  Patient Details  Name: Amy Barron MRN: [id number] Date of Birth: 1941-01-03 Referring Provider: Lorin Barron  Encounter Date: 09/29/2015      PT End of Session - 09/29/15 1015    Visit Number 12   Number of Visits 16   Date for PT Re-Evaluation 10/10/15   PT Start Time 1013   PT Stop Time 1115   PT Time Calculation (min) 62 min      Past Medical History  Diagnosis Date  . Hypertension   . Hyperlipidemia   . Stone, kidney     Past Surgical History  Procedure Laterality Date  . Abdominal hysterectomy  1998  . Parathyroidectomy    . Orif wrist fracture  01/18/2012    Procedure: OPEN REDUCTION INTERNAL FIXATION (ORIF) WRIST FRACTURE;  Surgeon: Amy Killings, MD;  Location: [place];  Service: Orthopedics;  Laterality: Left;  . Dilation and curettage of uterus    . Tubal ligation    . Colonoscopy w/ biopsies and polypectomy    . Multiple tooth extractions    . Orif humerus fracture Left 07/11/2015    Procedure: OPEN REDUCTION INTERNAL FIXATION (ORIF) PROXIMAL HUMERUS FRACTURE;  Surgeon: Amy Killings, MD;  Location: [place];  Service: Orthopedics;  Laterality: Left;    There were no vitals filed for this visit.  Visit Diagnosis:  Pain in joint of left shoulder  Proximal humerus fracture, left, open, initial encounter  Weakness of left arm      Subjective Assessment - 09/29/15 1015    Subjective No pain.                          [place] Adult PT Treatment/Exercise - 09/29/15 0001    Shoulder Exercises: Supine   Other Supine Exercises supine horizontal abduction yellow band and diagonals yellow band 10 x 2 each for HEP    Shoulder Exercises: Sidelying   Flexion Left;10 reps   Shoulder Exercises: Standing   External Rotation Left;20 reps;Theraband   Theraband Level (Shoulder External  Rotation) Level 1 (Yellow)   Internal Rotation Left;20 reps;Theraband   Theraband Level (Shoulder Internal Rotation) Level 1 (Yellow)   Row 20 reps;Theraband   Theraband Level (Shoulder Row) Level 1 (Yellow)   Shoulder Exercises: Pulleys   Flexion 3 minutes   ABduction 2 minutes  scaption   Shoulder Exercises: ROM/Strengthening   Other ROM/Strengthening Exercises Standing UE ranger level 12 with step , added horizontals with increased pain-dc, cabinet reaching middle shelf flexion x 15, scaption x 15 lower shelf   Shoulder Exercises: Stretch   Table Stretch - Flexion 5 reps;10 seconds   Table Stretch - External Rotation 5 reps;10 seconds   Table Stretch - External Rotation Limitations increased pain today - per patient    Cryotherapy   Number Minutes Cryotherapy 15 Minutes   Cryotherapy Location Shoulder   Type of Cryotherapy Ice pack                  PT Short Term Goals - 09/22/15 1037    PT SHORT TERM GOAL #1   Title Pt will be I with initial HEP for continued strengthening and mobility by 09/18/15.   Time 4   Period Weeks   Status Achieved   PT SHORT TERM GOAL #2   Title L shoulder PROM will  improve to 120 degrees pain-free for improved overhead reaching activities by 09/18/15.   Time 4   Period Weeks   Status On-going           PT Long Term Goals - 09/22/15 1037    PT LONG TERM GOAL #1   Title L shoulder strength flexion will improve to 3+/5 in order to lift 2# overhead x 10 reps without pain in order to return to pain-free functional activities for lifting overhead by 10/07/15.    Time 8   Period Weeks   Status On-going   PT LONG TERM GOAL #2   Title FOTO score will improve from 59% limited to <34% to demo improved function and mobility by 10/07/15.    Baseline 09/22/15: FOTO score improved from 59% at intake to  44%. Predicted 28%.    Time 8   Period Weeks   Status On-going   PT LONG TERM GOAL #3   Title FER will improve to C7 in orfer for pt to wash her  hair by 10/07/15.    Baseline 09/22/15: Unable to reach top of head to wash hair    Time 4   Period Weeks   Status On-going   PT LONG TERM GOAL #4   Title L shoulder flexion AROM will imrpove to 120 degrees for overhead reaching by 10/07/15.    Time 8   Period Weeks   Status On-going               Plan - 09/29/15 1054    Clinical Impression Statement Continued yellow band scapula and shoulder strengthening. Added supine horizontal abduction and gentle diagonals without increased pain. Pt to practice cabinet reaching with plastic cup. She is able to reach middle shelf in clinic.  Progressing toward strength and ROM goals.    PT Next Visit Plan Review HEP prescribed (table ER).  AROM, isometrics, gentle resistive exercises pain free, continue overhead PROM /AAROM. Did pt get pulleys? cabinet reaching        Problem List Patient Active Problem List   Diagnosis Date Noted  . GERD (gastroesophageal reflux disease) 08/14/2015  . Nausea with vomiting 08/14/2015  . Proximal humerus fracture 07/11/2015  . Allergic rhinitis 04/08/2015  . Distal radius fracture, left 01/19/2012    Class: Diagnosis of  . HLD (hyperlipidemia) 09/30/2010  . Essential hypertension 09/30/2010    Amy Barron, PTA 09/29/2015, 1:16 PM  [facility] [address] [place], [place], [address] Phone: [phone]   Fax:  [phone]  Name: Amy Barron MRN: [id number] Date of Birth: [birth year]

## 2015-10-01 ENCOUNTER — Encounter: Payer: No Typology Code available for payment source | Admitting: Physical Therapy

## 2015-10-01 ENCOUNTER — Ambulatory Visit: Payer: Medicare Other

## 2015-10-01 DIAGNOSIS — M25512 Pain in left shoulder: Secondary | ICD-10-CM

## 2015-10-01 DIAGNOSIS — S42202B Unspecified fracture of upper end of left humerus, initial encounter for open fracture: Secondary | ICD-10-CM

## 2015-10-01 DIAGNOSIS — R29898 Other symptoms and signs involving the musculoskeletal system: Secondary | ICD-10-CM

## 2015-10-01 NOTE — Therapy (Signed)
[place], [place], [address] Phone: [phone]   Fax:  [phone]  Physical Therapy Treatment  Patient Details  Name: Amy Barron MRN: [id number] Date of Birth: Jun 23, 1941 Referring Provider: Lorin Mercy  Encounter Date: 10/01/2015      PT End of Session - 10/01/15 1034    Visit Number 13   Number of Visits 16   Date for PT Re-Evaluation 10/10/15   PT Start Time 1025   PT Stop Time 1113   PT Time Calculation (min) 48 min   Activity Tolerance Patient tolerated treatment well   Behavior During Therapy [facility] for tasks assessed/performed      Past Medical History  Diagnosis Date  . Hypertension   . Hyperlipidemia   . Stone, kidney     Past Surgical History  Procedure Laterality Date  . Abdominal hysterectomy  1998  . Parathyroidectomy    . Orif wrist fracture  01/18/2012    Procedure: OPEN REDUCTION INTERNAL FIXATION (ORIF) WRIST FRACTURE;  Surgeon: Marybelle Killings, MD;  Location: [place];  Service: Orthopedics;  Laterality: Left;  . Dilation and curettage of uterus    . Tubal ligation    . Colonoscopy w/ biopsies and polypectomy    . Multiple tooth extractions    . Orif humerus fracture Left 07/11/2015    Procedure: OPEN REDUCTION INTERNAL FIXATION (ORIF) PROXIMAL HUMERUS FRACTURE;  Surgeon: Marybelle Killings, MD;  Location: [place];  Service: Orthopedics;  Laterality: Left;    There were no vitals filed for this visit.  Visit Diagnosis:  Pain in joint of left shoulder  Proximal humerus fracture, left, open, initial encounter  Weakness of left arm      Subjective Assessment - 10/01/15 1030    Subjective No Pain. Was doing things without realizing it until shoulder hurt.    Currently in Pain? No/denies            [facility] PT Assessment - 10/01/15 0001    AROM   Left Shoulder Flexion 95 Degrees   Left Shoulder ABduction 82 Degrees   Left Shoulder Internal Rotation --  buttock   Left Shoulder  External Rotation --  C6   PROM   Left Shoulder Flexion 120 Degrees   Left Shoulder ABduction 100 Degrees   Left Shoulder Internal Rotation 35 Degrees  @90  degrees ABD   Left Shoulder External Rotation 30 Degrees  @90  degrees ABD                     [facility] Adult PT Treatment/Exercise - 10/01/15 0001    Shoulder Exercises: Supine   Flexion AAROM;20 reps   Shoulder Flexion Weight (lbs) cane flexion supine   Shoulder Exercises: Seated   Other Seated Exercises Table slides for flex, ER, and scaption/ ABD with cues for hip hinge, posture and scapular depression x 15 reps each   Shoulder Exercises: Standing   External Rotation Left;20 reps;Theraband   Theraband Level (Shoulder External Rotation) Level 1 (Yellow)   Internal Rotation Left;20 reps;Theraband   Theraband Level (Shoulder Internal Rotation) Level 1 (Yellow)   Row 20 reps;Theraband   Theraband Level (Shoulder Row) Level 1 (Yellow)   Other Standing Exercises AAROM Cane Flex and ABD 15x each    Other Standing Exercises reaching to mid shelf with cup : 10 x each   Shoulder Exercises: Stretch   Table Stretch - Flexion 10 seconds   Table Stretch -Flexion Limitations x 10 reps  Table Stretch - External Rotation 10 seconds   Table Stretch - External Rotation Limitations x 10 reps    Cryotherapy   Number Minutes Cryotherapy 10 Minutes   Cryotherapy Location Shoulder   Type of Cryotherapy Ice pack   Manual Therapy   Joint Mobilization gentle joint distraction with oscillations, inf/post grade 1-3 glenohumeral joint mobs   Passive ROM L shoulder PROM all directions                   PT Short Term Goals - 09/22/15 1037    PT SHORT TERM GOAL #1   Title Pt will be I with initial HEP for continued strengthening and mobility by 09/18/15.   Time 4   Period Weeks   Status Achieved   PT SHORT TERM GOAL #2   Title L shoulder PROM will improve to 120 degrees pain-free for improved overhead reaching activities by  09/18/15.   Time 4   Period Weeks   Status On-going           PT Long Term Goals - 09/22/15 1037    PT LONG TERM GOAL #1   Title L shoulder strength flexion will improve to 3+/5 in order to lift 2# overhead x 10 reps without pain in order to return to pain-free functional activities for lifting overhead by 10/07/15.    Time 8   Period Weeks   Status On-going   PT LONG TERM GOAL #2   Title FOTO score will improve from 59% limited to <34% to demo improved function and mobility by 10/07/15.    Baseline 09/22/15: FOTO score improved from 59% at intake to  44%. Predicted 28%.    Time 8   Period Weeks   Status On-going   PT LONG TERM GOAL #3   Title FER will improve to C7 in orfer for pt to wash her hair by 10/07/15.    Baseline 09/22/15: Unable to reach top of head to wash hair    Time 4   Period Weeks   Status On-going   PT LONG TERM GOAL #4   Title L shoulder flexion AROM will imrpove to 120 degrees for overhead reaching by 10/07/15.    Time 8   Period Weeks   Status On-going               Plan - 10/01/15 1050    Clinical Impression Statement PROM and AROM remain relatively the same. Pt able to tolerate IR/ER stretching positioned into 90 degrees ABD.  Sees MD next Tuesday, 3/14. Pt reports compliance iwht yellow theraband ther ex for HEP, but required max VCs for proper technique.    PT Next Visit Plan   AROM, isometrics, gentle resistive exercises pain free theraband IR/ER, rows, cabinet reaching   PT Home Exercise Plan table slides: flex, ER, gentle resistive exercises pain free theraband IR/ER, rows   Consulted and Agree with Plan of Care Patient        Problem List Patient Active Problem List   Diagnosis Date Noted  . GERD (gastroesophageal reflux disease) 08/14/2015  . Nausea with vomiting 08/14/2015  . Proximal humerus fracture 07/11/2015  . Allergic rhinitis 04/08/2015  . Distal radius fracture, left 01/19/2012    Class: Diagnosis of  . HLD (hyperlipidemia)  09/30/2010  . Essential hypertension 09/30/2010    [name], PT 10/01/2015, 11:50 AM  [facility] [address] [place], [place], [address] Phone: [phone]   Fax:  [phone]  Name: Amy Barron MRN: [id number] Date of Birth: 25-May-1941

## 2015-10-02 ENCOUNTER — Encounter: Payer: No Typology Code available for payment source | Admitting: Physical Therapy

## 2015-10-06 ENCOUNTER — Ambulatory Visit: Payer: Medicare Other

## 2015-10-06 DIAGNOSIS — M25512 Pain in left shoulder: Secondary | ICD-10-CM

## 2015-10-06 DIAGNOSIS — S42202B Unspecified fracture of upper end of left humerus, initial encounter for open fracture: Secondary | ICD-10-CM | POA: Diagnosis not present

## 2015-10-06 DIAGNOSIS — R29898 Other symptoms and signs involving the musculoskeletal system: Secondary | ICD-10-CM | POA: Diagnosis not present

## 2015-10-06 NOTE — Therapy (Signed)
[place], [place], [address] Phone: [phone]   Fax:  [phone]  Physical Therapy RE-Evaluation  Patient Details  Name: Amy Barron MRN: [id number] Date of Birth: [birth year] Referring Provider: Lorin Mercy  Encounter Date: 10/06/2015      PT End of Session - 10/06/15 1021    Visit Number 14   Number of Visits 16   Date for PT Re-Evaluation 11/14/15   PT Start Time [id number]   PT Stop Time 1113   PT Time Calculation (min) 58 min   Activity Tolerance Patient tolerated treatment well   Behavior During Therapy [facility] for tasks assessed/performed      Past Medical History  Diagnosis Date  . Hypertension   . Hyperlipidemia   . Stone, kidney     Past Surgical History  Procedure Laterality Date  . Abdominal hysterectomy  1998  . Parathyroidectomy    . Orif wrist fracture  01/18/2012    Procedure: OPEN REDUCTION INTERNAL FIXATION (ORIF) WRIST FRACTURE;  Surgeon: Marybelle Killings, MD;  Location: [place];  Service: Orthopedics;  Laterality: Left;  . Dilation and curettage of uterus    . Tubal ligation    . Colonoscopy w/ biopsies and polypectomy    . Multiple tooth extractions    . Orif humerus fracture Left 07/11/2015    Procedure: OPEN REDUCTION INTERNAL FIXATION (ORIF) PROXIMAL HUMERUS FRACTURE;  Surgeon: Marybelle Killings, MD;  Location: [place];  Service: Orthopedics;  Laterality: Left;    There were no vitals filed for this visit.  Visit Diagnosis:  Pain in joint of left shoulder - Plan: PT plan of care cert/re-cert  Proximal humerus fracture, left, open, initial encounter - Plan: PT plan of care cert/re-cert  Weakness of left arm - Plan: PT plan of care cert/re-cert      Subjective Assessment - 10/06/15 1017    Subjective No Pain. Doing more at home with cleaning. Not trying to baby it. Sees MD tomorrow. Getting better, but still difficulty with reaching, lifting, grooming.    Currently in Pain? No/denies    Pain Score 0-No pain            [facility] PT Assessment - 10/06/15 0001    Observation/Other Assessments   Focus on Therapeutic Outcomes (FOTO)  50% limited   AROM   Left Shoulder Flexion 95 Degrees   Left Shoulder ABduction 82 Degrees   Left Shoulder Internal Rotation --  buttock   Left Shoulder External Rotation --  C6   PROM   Left Shoulder Flexion 130 Degrees   Left Shoulder ABduction 100 Degrees   Left Shoulder Internal Rotation 50 Degrees  @90  degrees ABD   Left Shoulder External Rotation 35 Degrees  @90  degrees ABD   Strength   Left Shoulder Flexion 2-/5   Left Shoulder Extension 3+/5   Left Shoulder ABduction 2-/5   Left Shoulder Internal Rotation 3+/5   Left Shoulder External Rotation 3-/5                   [facility] Adult PT Treatment/Exercise - 10/06/15 0001    Shoulder Exercises: Supine   Flexion AROM;10 reps  x 3   Shoulder Exercises: Seated   Other Seated Exercises --   Other Seated Exercises UBE back 2 mins, forward 2 mins    Shoulder Exercises: Sidelying   External Rotation 10 reps  x 3   ABduction 10 reps  x 3   Shoulder Exercises: Standing  External Rotation Left;20 reps;Theraband   Theraband Level (Shoulder External Rotation) Level 1 (Yellow)   Internal Rotation Left;20 reps;Theraband   Theraband Level (Shoulder Internal Rotation) Level 1 (Yellow)   Row 20 reps;Theraband   Theraband Level (Shoulder Row) Level 2 (Red)   Shoulder Exercises: Pulleys   Flexion 2 minutes   ABduction 1 minute   Shoulder Exercises: Stretch   Table Stretch - Flexion 10 seconds   Table Stretch -Flexion Limitations x 10 reps    Table Stretch - Abduction 10 seconds   Table Stretch - ABduction Limitations x 10 reps    Table Stretch - External Rotation 10 seconds   Table Stretch - External Rotation Limitations x 10 reps    Cryotherapy   Number Minutes Cryotherapy 10 Minutes   Cryotherapy Location Shoulder   Type of Cryotherapy Ice pack   Manual Therapy    Joint Mobilization gentle joint distraction with oscillations, inf/post grade 1-3 glenohumeral joint mobs   Passive ROM L shoulder PROM all directions                 PT Education - 10/06/15 1025    Education provided Yes   Education Details PT : 2 times a week x 6 weeks, plan to continue and recert , reviewed tables slides for HEP   Person(s) Educated Patient   Methods Explanation;Demonstration   Comprehension Verbalized understanding          PT Short Term Goals - 10/06/15 1038    PT SHORT TERM GOAL #1   Title Pt will be I with initial HEP for continued strengthening and mobility by 09/18/15.   Time 4   Period Weeks   Status Achieved   PT SHORT TERM GOAL #2   Title L shoulder PROM will improve to 120 degrees pain-free for improved overhead reaching activities by 09/18/15.   Baseline L SHoulder flexion PROM 0-130   Time 4   Period Weeks   Status Achieved           PT Long Term Goals - 10/06/15 1037    PT LONG TERM GOAL #1   Title L shoulder strength flexion will improve to 3+/5 in order to lift 2# overhead x 10 reps without pain in order to return to pain-free functional activities for lifting overhead by 10/07/15.    Time 8   Period Weeks   Status On-going   PT LONG TERM GOAL #2   Title FOTO score will improve from 59% limited to <34% to demo improved function and mobility by 10/07/15.    Time 8   Period Weeks   Status On-going   PT LONG TERM GOAL #3   Title FER will improve to C7 in orfer for pt to wash her hair by 10/07/15.    Time 4   Period Weeks   Status On-going   PT LONG TERM GOAL #4   Title L shoulder flexion AROM will imrpove to 120 degrees for overhead reaching by 10/07/15.    Time 8   Period Weeks   Status On-going               Plan - 10/06/15 1028    Clinical Impression Statement Reassessed goals, ROM, and FOTO for RE-Eval today.  REassesed goals, PROM, strength, and AROM,  and  all STGs achieved. Pt is still lacking AROM and strength  and would conitnue to benefit from continued PT 2 x a week for 6 weeks  to address lacking impairments and functional  limitations. All LTGs continue to be appropriate for pt.   Added UBE- 2 mins back followed by 2 mins forward with good tolerance and improved mobility with increased time.  Reviewed table slides for flex, ABD, and ER for HEP and continued stretching.   PT Next Visit Plan   AROM, isometrics, gentle resistive exercises pain free theraband IR/ER, rows, cabinet reaching   PT Home Exercise Plan table slides: flex, ER, gentle resistive exercises pain free theraband IR/ER, rows   Consulted and Agree with Plan of Care Patient          G-Codes - 2015/10/10 1222    Functional Assessment Tool Used FOTO: 50%    Functional Limitation Carrying, moving and handling objects   Carrying, Moving and Handling Objects Current Status [id number]) At least 40 percent but less than 60 percent impaired, limited or restricted   Carrying, Moving and Handling Objects Goal Status [id number]) At least 1 percent but less than 20 percent impaired, limited or restricted       Problem List Patient Active Problem List   Diagnosis Date Noted  . GERD (gastroesophageal reflux disease) 08/14/2015  . Nausea with vomiting 08/14/2015  . Proximal humerus fracture 07/11/2015  . Allergic rhinitis 04/08/2015  . Distal radius fracture, left 01/19/2012    Class: Diagnosis of  . HLD (hyperlipidemia) 09/30/2010  . Essential hypertension 09/30/2010    [name] , PT  October 10, 2015, 12:26 PM  [facility] [address] [place], [place], [address] Phone: [phone]   Fax:  [phone]  Name: EVI POLLOK MRN: [id number] Date of Birth: [birth year]

## 2015-10-08 ENCOUNTER — Ambulatory Visit: Payer: Medicare Other

## 2015-10-08 DIAGNOSIS — R29898 Other symptoms and signs involving the musculoskeletal system: Secondary | ICD-10-CM | POA: Diagnosis not present

## 2015-10-08 DIAGNOSIS — S42202B Unspecified fracture of upper end of left humerus, initial encounter for open fracture: Secondary | ICD-10-CM | POA: Diagnosis not present

## 2015-10-08 DIAGNOSIS — M25512 Pain in left shoulder: Secondary | ICD-10-CM

## 2015-10-08 NOTE — Therapy (Signed)
Amy Barron, [place], [address] Phone: [phone]   Fax:  [phone]  Physical Therapy Treatment  Patient Details  Name: Amy Barron MRN: [id number] Date of Birth: October 18, 1940 Referring Provider: Lorin Mercy  Encounter Date: 10/08/2015      PT End of Session - 10/08/15 0903    Visit Number 15   Number of Visits 28   Date for PT Re-Evaluation 11/14/15   PT Start Time 0850   PT Stop Time 0940   PT Time Calculation (min) 50 min   Activity Tolerance Patient tolerated treatment well   Behavior During Therapy [facility] for tasks assessed/performed      Past Medical History  Diagnosis Date  . Hypertension   . Hyperlipidemia   . Stone, kidney     Past Surgical History  Procedure Laterality Date  . Abdominal hysterectomy  1998  . Parathyroidectomy    . Orif wrist fracture  01/18/2012    Procedure: OPEN REDUCTION INTERNAL FIXATION (ORIF) WRIST FRACTURE;  Surgeon: Marybelle Killings, MD;  Location: [place];  Service: Orthopedics;  Laterality: Left;  . Dilation and curettage of uterus    . Tubal ligation    . Colonoscopy w/ biopsies and polypectomy    . Multiple tooth extractions    . Orif humerus fracture Left 07/11/2015    Procedure: OPEN REDUCTION INTERNAL FIXATION (ORIF) PROXIMAL HUMERUS FRACTURE;  Surgeon: Marybelle Killings, MD;  Location: [place];  Service: Orthopedics;  Laterality: Left;    There were no vitals filed for this visit.  Visit Diagnosis:  Pain in joint of left shoulder  Proximal humerus fracture, left, open, initial encounter  Weakness of left arm      Subjective Assessment - 10/08/15 0858    Subjective No pain today. Saw MD yesterday and plans to follow-up in another 2 months.    Currently in Pain? No/denies   Pain Score 0-No pain   Pain Location Shoulder   Pain Orientation Left                         [facility] Adult PT Treatment/Exercise - 10/08/15 0001    Shoulder Exercises:  Standing   External Rotation Left;15 reps;Theraband  x 2 sets    Theraband Level (Shoulder External Rotation) Level 1 (Yellow)   Internal Rotation Left;15 reps;Theraband  x 2 sets    Theraband Level (Shoulder Internal Rotation) Level 1 (Yellow)   Flexion AROM;10 reps  to 90 degrees, x 2 sets    ABduction AROM;10 reps  to 90 degrees, x 2 sets    Extension Strengthening;15 reps  x 2 sets    Row 15 reps;Theraband  x 2 sets    Theraband Level (Shoulder Row) Level 2 (Red)   Other Standing Exercises AAROM Cane Flex and ABD, IR/ER 15x each    Other Standing Exercises Finger ladder  reaching into cabinet. mid shelf x 10 reps.    Shoulder Exercises: Pulleys   Flexion 3 minutes   ABduction 1 minute   Cryotherapy   Number Minutes Cryotherapy 10 Minutes   Cryotherapy Location Shoulder   Type of Cryotherapy Ice pack                PT Education - 10/08/15 0902    Education provided Yes   Education Details Standing Cane  for HEP : flex, ABD, ER, and IR.    Person(s) Educated Patient   Methods Explanation;Demonstration   Comprehension  Verbalized understanding          PT Short Term Goals - 10/06/15 1038    PT SHORT TERM GOAL #1   Title Pt will be I with initial HEP for continued strengthening and mobility by 09/18/15.   Time 4   Period Weeks   Status Achieved   PT SHORT TERM GOAL #2   Title L shoulder PROM will improve to 120 degrees pain-free for improved overhead reaching activities by 09/18/15.   Baseline L SHoulder flexion PROM 0-130   Time 4   Period Weeks   Status Achieved           PT Long Term Goals - 10/06/15 1037    PT LONG TERM GOAL #1   Title L shoulder strength flexion will improve to 3+/5 in order to lift 2# overhead x 10 reps without pain in order to return to pain-free functional activities for lifting overhead by 10/07/15.    Time 8   Period Weeks   Status On-going   PT LONG TERM GOAL #2   Title FOTO score will improve from 59% limited to <34% to  demo improved function and mobility by 10/07/15.    Time 8   Period Weeks   Status On-going   PT LONG TERM GOAL #3   Title FER will improve to C7 in orfer for pt to wash her hair by 10/07/15.    Time 4   Period Weeks   Status On-going   PT LONG TERM GOAL #4   Title L shoulder flexion AROM will imrpove to 120 degrees for overhead reaching by 10/07/15.    Time 8   Period Weeks   Status On-going               Plan - 10/08/15 0905    Clinical Impression Statement Pt tolerated progression of standing cane exercises and was prescribed for home. Pt is still not able to reach overhead, lift, or groom without difficulty.  Pt was able to perform finger ladder without pain today. Progressed to AROM flexion and ABD to 90 degrees in standing with VCs  for scap depression and to avoid hiking L shoulder.    PT Frequency 2x / week   PT Duration 6 weeks   PT Next Visit Plan   AROM, cabinet reaching. ADD theraband exercises for  HEP. ER stretch on doorway   PT Home Exercise Plan table slides: flex, ER, gentle resistive exercises pain free theraband IR/ER, rows, standing cane exercises: flex, ABD, ER/IR.    Consulted and Agree with Plan of Care Patient        Problem List Patient Active Problem List   Diagnosis Date Noted  . GERD (gastroesophageal reflux disease) 08/14/2015  . Nausea with vomiting 08/14/2015  . Proximal humerus fracture 07/11/2015  . Allergic rhinitis 04/08/2015  . Distal radius fracture, left 01/19/2012    Class: Diagnosis of  . HLD (hyperlipidemia) 09/30/2010  . Essential hypertension 09/30/2010    Amy Barron,  PT 10/08/2015, 9:29 AM  [facility] [address] [place], [place], [address] Phone: [phone]   Fax:  [phone]  Name: Amy Barron MRN: [id number] Date of Birth: August 14, 1940

## 2015-10-09 ENCOUNTER — Encounter: Payer: No Typology Code available for payment source | Admitting: Physical Therapy

## 2015-10-13 ENCOUNTER — Ambulatory Visit: Payer: Medicare Other

## 2015-10-13 DIAGNOSIS — M25512 Pain in left shoulder: Secondary | ICD-10-CM

## 2015-10-13 DIAGNOSIS — R29898 Other symptoms and signs involving the musculoskeletal system: Secondary | ICD-10-CM

## 2015-10-13 DIAGNOSIS — S42202B Unspecified fracture of upper end of left humerus, initial encounter for open fracture: Secondary | ICD-10-CM | POA: Diagnosis not present

## 2015-10-13 NOTE — Patient Instructions (Signed)
SHOULDER: External Rotation    Sitting in doorway, place hand against wall. Keep elbow close to body. Lean forward. Hold _30-60_ seconds. _3__ reps per set, __3_ sets per day, __7_ days per week  Copyright  VHI. All rights reserved.

## 2015-10-13 NOTE — Therapy (Signed)
[place], [place], [address] Phone: [phone]   Fax:  [phone]  Physical Therapy Treatment  Patient Details  Name: Amy Barron MRN: [id number] Date of Birth: 1940/11/27 Referring Provider: Lorin Mercy  Encounter Date: 10/13/2015      PT End of Session - 10/13/15 1012    Visit Number 16   Number of Visits 28   Date for PT Re-Evaluation 11/14/15   PT Start Time [id number]   PT Stop Time 1105   PT Time Calculation (min) 50 min   Activity Tolerance Patient tolerated treatment well   Behavior During Therapy [facility] for tasks assessed/performed      Past Medical History  Diagnosis Date  . Hypertension   . Hyperlipidemia   . Stone, kidney     Past Surgical History  Procedure Laterality Date  . Abdominal hysterectomy  1998  . Parathyroidectomy    . Orif wrist fracture  01/18/2012    Procedure: OPEN REDUCTION INTERNAL FIXATION (ORIF) WRIST FRACTURE;  Surgeon: Marybelle Killings, MD;  Location: [place];  Service: Orthopedics;  Laterality: Left;  . Dilation and curettage of uterus    . Tubal ligation    . Colonoscopy w/ biopsies and polypectomy    . Multiple tooth extractions    . Orif humerus fracture Left 07/11/2015    Procedure: OPEN REDUCTION INTERNAL FIXATION (ORIF) PROXIMAL HUMERUS FRACTURE;  Surgeon: Marybelle Killings, MD;  Location: [place];  Service: Orthopedics;  Laterality: Left;    There were no vitals filed for this visit.  Visit Diagnosis:  Proximal humerus fracture, left, open, initial encounter  Pain in joint of left shoulder  Weakness of left arm      Subjective Assessment - 10/13/15 1016    Subjective Pt admits to not "exercising" yesterday and reports it is "stiff" today, as a result.    Currently in Pain? No/denies   Pain Score 0-No pain   Pain Location Shoulder   Pain Orientation Left                         [facility] Adult PT Treatment/Exercise - 10/13/15 0001    Shoulder  Exercises: Seated   Other Seated Exercises UBE back 2 mins, forward 2 mins    Shoulder Exercises: Standing   External Rotation Left;15 reps;Theraband  x 2 sets    Theraband Level (Shoulder External Rotation) Level 1 (Yellow)   Internal Rotation Left;15 reps;Theraband  x 2 sets    Theraband Level (Shoulder Internal Rotation) Level 1 (Yellow)   Flexion AROM;15 reps  to 90 degrees, x 2 sets    ABduction AROM;15 reps  to 90 degrees, x 2 sets    Row 20 reps;Theraband  x 2 sets    Theraband Level (Shoulder Row) Level 2 (Red)   Other Standing Exercises Doorway stretch into ER. 30 sec x 3. IR stretch with towel, 30 sec x 3.   ER stretch for HEP    Other Standing Exercises Finger ladder  reaching into cabinet. mid shelf x 15 reps.    Shoulder Exercises: Pulleys   Flexion 2 minutes   ABduction 2 minutes   Cryotherapy   Number Minutes Cryotherapy 10 Minutes   Cryotherapy Location Shoulder   Type of Cryotherapy Ice pack                  PT Short Term Goals - 10/06/15 1038    PT SHORT TERM GOAL #  1   Title Pt will be I with initial HEP for continued strengthening and mobility by 09/18/15.   Time 4   Period Weeks   Status Achieved   PT SHORT TERM GOAL #2   Title L shoulder PROM will improve to 120 degrees pain-free for improved overhead reaching activities by 09/18/15.   Baseline L SHoulder flexion PROM 0-130   Time 4   Period Weeks   Status Achieved           PT Long Term Goals - 10/06/15 1037    PT LONG TERM GOAL #1   Title L shoulder strength flexion will improve to 3+/5 in order to lift 2# overhead x 10 reps without pain in order to return to pain-free functional activities for lifting overhead by 10/07/15.    Time 8   Period Weeks   Status On-going   PT LONG TERM GOAL #2   Title FOTO score will improve from 59% limited to <34% to demo improved function and mobility by 10/07/15.    Time 8   Period Weeks   Status On-going   PT LONG TERM GOAL #3   Title FER will  improve to C7 in orfer for pt to wash her hair by 10/07/15.    Time 4   Period Weeks   Status On-going   PT LONG TERM GOAL #4   Title L shoulder flexion AROM will imrpove to 120 degrees for overhead reaching by 10/07/15.    Time 8   Period Weeks   Status On-going               Plan - 10/13/15 1023    Clinical Impression Statement Pt tolerated gentle theraband ther ex and progression to AROM ther ex without pain.Pt admits to not performing HEP, and stiffness is apparent as a result, especially with IR/ER. Added ER stretch for HEP for home.  Increased reps with all activites without pain. Improved strength and endurance noted with tolerance to ther ex. Added ER stretch in doorway for HEP to assist with limited ER ROM.    PT Next Visit Plan   AROM, cabinet reaching. Review added theraband exercises for HEP. Review  ER stretch on doorway. Add IR stretch with towel    PT Home Exercise Plan table slides: flex, ER, gentle resistive exercises pain free theraband IR/ER, rows, standing cane exercises: flex, ABD, ER/IR. ER stretch in doorway   Consulted and Agree with Plan of Care Patient        Problem List Patient Active Problem List   Diagnosis Date Noted  . GERD (gastroesophageal reflux disease) 08/14/2015  . Nausea with vomiting 08/14/2015  . Proximal humerus fracture 07/11/2015  . Allergic rhinitis 04/08/2015  . Distal radius fracture, left 01/19/2012    Class: Diagnosis of  . HLD (hyperlipidemia) 09/30/2010  . Essential hypertension 09/30/2010    [name], PT 10/13/2015, 10:56 AM  [facility] [address] [place], [place], [address] Phone: [phone]   Fax:  [phone]  Name: LUCIJA TOWNSELL MRN: [id number] Date of Birth: 1941/03/07

## 2015-10-15 ENCOUNTER — Other Ambulatory Visit: Payer: Self-pay | Admitting: *Deleted

## 2015-10-15 ENCOUNTER — Ambulatory Visit: Payer: Medicare Other

## 2015-10-15 ENCOUNTER — Encounter: Payer: No Typology Code available for payment source | Admitting: Physical Therapy

## 2015-10-15 DIAGNOSIS — S42202B Unspecified fracture of upper end of left humerus, initial encounter for open fracture: Secondary | ICD-10-CM

## 2015-10-15 DIAGNOSIS — R29898 Other symptoms and signs involving the musculoskeletal system: Secondary | ICD-10-CM | POA: Diagnosis not present

## 2015-10-15 DIAGNOSIS — M25512 Pain in left shoulder: Secondary | ICD-10-CM | POA: Diagnosis not present

## 2015-10-15 MED ORDER — ETODOLAC 400 MG PO TABS: ORAL_TABLET | ORAL | Status: DC

## 2015-10-15 NOTE — Therapy (Signed)
Amy Barron, [place], [address] Phone: [phone]   Fax:  [phone]  Physical Therapy Treatment  Patient Details  Name: Amy Barron MRN: [id number] Date of Birth: 1940-12-03 Referring Provider: Lorin Mercy  Encounter Date: 10/15/2015      PT End of Session - 10/15/15 1201    Visit Number 17   Number of Visits 28   Date for PT Re-Evaluation 11/14/15   PT Start Time 1150   PT Stop Time 1240   PT Time Calculation (min) 50 min   Activity Tolerance Patient tolerated treatment well   Behavior During Therapy [facility] for tasks assessed/performed      Past Medical History  Diagnosis Date  . Hypertension   . Hyperlipidemia   . Stone, kidney     Past Surgical History  Procedure Laterality Date  . Abdominal hysterectomy  1998  . Parathyroidectomy    . Orif wrist fracture  01/18/2012    Procedure: OPEN REDUCTION INTERNAL FIXATION (ORIF) WRIST FRACTURE;  Surgeon: Marybelle Killings, MD;  Location: [place];  Service: Orthopedics;  Laterality: Left;  . Dilation and curettage of uterus    . Tubal ligation    . Colonoscopy w/ biopsies and polypectomy    . Multiple tooth extractions    . Orif humerus fracture Left 07/11/2015    Procedure: OPEN REDUCTION INTERNAL FIXATION (ORIF) PROXIMAL HUMERUS FRACTURE;  Surgeon: Marybelle Killings, MD;  Location: [place];  Service: Orthopedics;  Laterality: Left;    There were no vitals filed for this visit.  Visit Diagnosis:  Proximal humerus fracture, left, open, initial encounter  Pain in joint of left shoulder  Weakness of left arm      Subjective Assessment - 10/15/15 1159    Subjective Pt reprots some soreness following last visit, but has resolved. No pain today. Most difficulty with doing hair and reaching into cabinets. Pt is unable to sleep on L side, yet.    Currently in Pain? No/denies   Pain Score 0-No pain                         [facility] Adult PT  Treatment/Exercise - 10/15/15 0001    Shoulder Exercises: Standing   External Rotation Left;20 reps;Theraband  x 2 sets    Theraband Level (Shoulder External Rotation) Level 1 (Yellow)   Internal Rotation Left;20 reps;Theraband  x 2 sets    Theraband Level (Shoulder Internal Rotation) Level 1 (Yellow)   Flexion AROM;20 reps   ABduction AROM;20 reps   Extension Strengthening;20 reps;Theraband  x 2 sets    Theraband Level (Shoulder Extension) Level 2 (Red)   Row 20 reps;Theraband  x 2 sets    Theraband Level (Shoulder Row) Level 2 (Red)   Other Standing Exercises Doorway stretch into ER. 30 sec x 3. IR stretch with towel, 30 sec x 3.   ER stretch for HEP    Other Standing Exercises Finger ladder  reaching into cabinet. mid shelf x 15 reps, 1 lbs.     Shoulder Exercises: Pulleys   Flexion 2 minutes   ABduction 2 minutes   Shoulder Exercises: ROM/Strengthening   UBE (Upper Arm Bike) 2 for/ 2 back    Shoulder Exercises: Stretch   Other Shoulder Stretches ER stretch at wall 30 secs x 3   Other Shoulder Stretches IR towel stretch 30 secs x 3    Cryotherapy   Number Minutes Cryotherapy 10 Minutes  Cryotherapy Location Shoulder   Type of Cryotherapy Ice pack   Manual Therapy   Joint Mobilization joint mob of R elbow due to c/o of numbness into ulnar nerve.   distraction and A to P   Passive ROM L shoulder PROM all directions   Passive stretching into ER, very limited.                   PT Short Term Goals - 10/06/15 1038    PT SHORT TERM GOAL #1   Title Pt will be I with initial HEP for continued strengthening and mobility by 09/18/15.   Time 4   Period Weeks   Status Achieved   PT SHORT TERM GOAL #2   Title L shoulder PROM will improve to 120 degrees pain-free for improved overhead reaching activities by 09/18/15.   Baseline L SHoulder flexion PROM 0-130   Time 4   Period Weeks   Status Achieved           PT Long Term Goals - 10/06/15 1037    PT LONG TERM  GOAL #1   Title L shoulder strength flexion will improve to 3+/5 in order to lift 2# overhead x 10 reps without pain in order to return to pain-free functional activities for lifting overhead by 10/07/15.    Time 8   Period Weeks   Status On-going   PT LONG TERM GOAL #2   Title FOTO score will improve from 59% limited to <34% to demo improved function and mobility by 10/07/15.    Time 8   Period Weeks   Status On-going   PT LONG TERM GOAL #3   Title FER will improve to C7 in orfer for pt to wash her hair by 10/07/15.    Time 4   Period Weeks   Status On-going   PT LONG TERM GOAL #4   Title L shoulder flexion AROM will imrpove to 120 degrees for overhead reaching by 10/07/15.    Time 8   Period Weeks   Status On-going               Plan - 10/15/15 1204    Clinical Impression Statement Progressed AROM ther ex and lifting to cabinet with 1 lb weight.  No increased pain. Pt demonstrated understanding IR/ER stretches, but is note as compliant. PT explained importance of ER stretch for HEP  due to current  limitations.    PT Next Visit Plan   AROM, cabinet reaching with weight. Review added theraband exercises for HEP. Review ER stretch on doorway. Add IR stretch with towel    PT Home Exercise Plan table slides: flex, ER, gentle resistive exercises pain free theraband IR/ER, rows, standing cane exercises: flex, ABD, ER/IR. ER stretch in doorway   Consulted and Agree with Plan of Care Patient        Problem List Patient Active Problem List   Diagnosis Date Noted  . GERD (gastroesophageal reflux disease) 08/14/2015  . Nausea with vomiting 08/14/2015  . Proximal humerus fracture 07/11/2015  . Allergic rhinitis 04/08/2015  . Distal radius fracture, left 01/19/2012    Class: Diagnosis of  . HLD (hyperlipidemia) 09/30/2010  . Essential hypertension 09/30/2010    Amy Barron, PT 10/15/2015, 12:59 PM  [facility] [address] [place], [place], [address] Phone: [phone]   Fax:  [phone]  Name: Amy Barron MRN: [id number] Date of Birth: 1940/12/13

## 2015-10-17 ENCOUNTER — Other Ambulatory Visit: Payer: Self-pay | Admitting: *Deleted

## 2015-10-17 ENCOUNTER — Encounter: Payer: No Typology Code available for payment source | Admitting: Physical Therapy

## 2015-10-17 MED ORDER — TRIAMTERENE-HCTZ 37.5-25 MG PO TABS: 1.0000 | ORAL_TABLET | Freq: Every day | ORAL | Status: DC

## 2015-10-17 MED ORDER — AMLODIPINE BESYLATE 5 MG PO TABS: ORAL_TABLET | ORAL | Status: DC

## 2015-10-17 NOTE — Addendum Note (Signed)
Addended by: Marchia Bond on: 10/17/2015 10:17 AM   Modules accepted: Orders

## 2015-10-20 ENCOUNTER — Ambulatory Visit: Payer: Medicare Other | Admitting: Physical Therapy

## 2015-10-20 DIAGNOSIS — M25512 Pain in left shoulder: Secondary | ICD-10-CM

## 2015-10-20 DIAGNOSIS — R29898 Other symptoms and signs involving the musculoskeletal system: Secondary | ICD-10-CM

## 2015-10-20 DIAGNOSIS — S42202B Unspecified fracture of upper end of left humerus, initial encounter for open fracture: Secondary | ICD-10-CM

## 2015-10-20 NOTE — Therapy (Signed)
[place], [place], [address] Phone: [phone]   Fax:  [phone]  Physical Therapy Treatment  Patient Details  Name: Amy Barron MRN: [id number] Date of Birth: 1941/06/04 Referring Provider: Lorin Mercy  Encounter Date: 10/20/2015      PT End of Session - 10/20/15 1154    Visit Number 18   Number of Visits 28   Date for PT Re-Evaluation 11/14/15   PT Start Time 1020   PT Stop Time 1110   PT Time Calculation (min) 50 min      Past Medical History  Diagnosis Date  . Hypertension   . Hyperlipidemia   . Stone, kidney     Past Surgical History  Procedure Laterality Date  . Abdominal hysterectomy  1998  . Parathyroidectomy    . Orif wrist fracture  01/18/2012    Procedure: OPEN REDUCTION INTERNAL FIXATION (ORIF) WRIST FRACTURE;  Surgeon: Marybelle Killings, MD;  Location: [place];  Service: Orthopedics;  Laterality: Left;  . Dilation and curettage of uterus    . Tubal ligation    . Colonoscopy w/ biopsies and polypectomy    . Multiple tooth extractions    . Orif humerus fracture Left 07/11/2015    Procedure: OPEN REDUCTION INTERNAL FIXATION (ORIF) PROXIMAL HUMERUS FRACTURE;  Surgeon: Marybelle Killings, MD;  Location: [place];  Service: Orthopedics;  Laterality: Left;    There were no vitals filed for this visit.  Visit Diagnosis:  Proximal humerus fracture, left, open, initial encounter  Pain in joint of left shoulder  Weakness of left arm      Subjective Assessment - 10/20/15 1021    Subjective Stiff not pain.    Currently in Pain? No/denies            [facility] PT Assessment - 10/20/15 0001    AROM   Left Shoulder Flexion 95 Degrees   Left Shoulder Internal Rotation --  buttock   Left Shoulder External Rotation --  C7                     [facility] Adult PT Treatment/Exercise - 10/20/15 0001    Shoulder Exercises: Supine   Horizontal ABduction Strengthening;Both;15 reps;Theraband   Theraband Level (Shoulder Horizontal ABduction) Level 1 (Yellow)   Other Supine Exercises diagaonals and flexion x 10 with yellow band, pt prefers scaption, increased pain with flexion, pullovers x 15   Shoulder Exercises: Standing   Other Standing Exercises Doorway stretch into ER. 30 sec x 3. IR stretch with towel, 30 sec x 3.   ER stretch for HEP    Other Standing Exercises reaching into cabinet mid shelf 1# low shelf 2#   Shoulder Exercises: Pulleys   Flexion 2 minutes   ABduction 2 minutes   Shoulder Exercises: ROM/Strengthening   UBE (Upper Arm Bike) 3 minutes level 1, switching each minute-pt reports anterior shoulder and arm pain with forward to dc   Cryotherapy   Number Minutes Cryotherapy 10 Minutes   Cryotherapy Location Shoulder   Type of Cryotherapy Ice pack                  PT Short Term Goals - 10/06/15 1038    PT SHORT TERM GOAL #1   Title Pt will be I with initial HEP for continued strengthening and mobility by 09/18/15.   Time 4   Period Weeks   Status Achieved   PT SHORT TERM GOAL #2   Title  L shoulder PROM will improve to 120 degrees pain-free for improved overhead reaching activities by 09/18/15.   Baseline L SHoulder flexion PROM 0-130   Time 4   Period Weeks   Status Achieved           PT Long Term Goals - 10/20/15 1048    PT LONG TERM GOAL #1   Title L shoulder strength flexion will improve to 3+/5 in order to lift 2# overhead x 10 reps without pain in order to return to pain-free functional activities for lifting overhead by 10/07/15.    Baseline 1# cabinet reaching   Time 8   Period Weeks   Status On-going   PT LONG TERM GOAL #2   Title FOTO score will improve from 59% limited to <34% to demo improved function and mobility by 10/07/15.    Baseline 09/22/15: FOTO score improved from 59% at intake to  44%. Predicted 28%.    Time 8   Period Weeks   Status Unable to assess   PT LONG TERM GOAL #3   Title FER will improve to C7 in orfer for  pt to wash her hair by 10/07/15.    Time 4   Period Weeks   Status Achieved   PT LONG TERM GOAL #4   Title L shoulder flexion AROM will imrpove to 120 degrees for overhead reaching by 10/07/15.    Baseline 95 best    Time 8   Period Weeks   Status On-going               Plan - 10/20/15 1042    Clinical Impression Statement Pt reports she is able to put her earrings on and her necklace on using her LUE.She is using her LUE to wash he hair and can reach to C-7 fuctionally.  LTG#3 MET> Review of ER and IR stretch again requiring max cues for technique and hold time. Trial of bilateral doorway stretch with pt demonstrating better technique. Supine band exercises with good tolerance   PT Next Visit Plan   AROM, cabinet reaching with weight. Review added theraband exercises for HEP. Review bilateral ER stretch on doorway. Add IR stretch with towel PROM/MOBS        Problem List Patient Active Problem List   Diagnosis Date Noted  . GERD (gastroesophageal reflux disease) 08/14/2015  . Nausea with vomiting 08/14/2015  . Proximal humerus fracture 07/11/2015  . Allergic rhinitis 04/08/2015  . Distal radius fracture, left 01/19/2012    Class: Diagnosis of  . HLD (hyperlipidemia) 09/30/2010  . Essential hypertension 09/30/2010    [name], PTA 10/20/2015, 11:57 AM  [facility] [address] [place], [place], [address] Phone: [phone]   Fax:  [phone]  Name: Amy Barron MRN: [id number] Date of Birth: 09/22/1940

## 2015-10-22 ENCOUNTER — Ambulatory Visit: Payer: Medicare Other | Admitting: Physical Therapy

## 2015-10-22 ENCOUNTER — Encounter: Payer: No Typology Code available for payment source | Admitting: Physical Therapy

## 2015-10-22 DIAGNOSIS — S42202B Unspecified fracture of upper end of left humerus, initial encounter for open fracture: Secondary | ICD-10-CM | POA: Diagnosis not present

## 2015-10-22 DIAGNOSIS — M25512 Pain in left shoulder: Secondary | ICD-10-CM

## 2015-10-22 DIAGNOSIS — R29898 Other symptoms and signs involving the musculoskeletal system: Secondary | ICD-10-CM | POA: Diagnosis not present

## 2015-10-22 NOTE — Patient Instructions (Signed)
Internal Rotation (Passive)    Bring hand behind back, using other hand to assist. Hold __30__ seconds. Repeat 3____ times. Do _3___ sessions per day.    Over Head Pull: Narrow Grip       On back, knees bent, feet flat, band across thighs, elbows straight but relaxed. Pull hands apart (start). Keeping elbows straight, bring arms up and over head, hands toward floor. Keep pull steady on band. Hold momentarily. Return slowly, keeping pull steady, back to start. Repeat _15__ times. Band color __y____   Side Pull: Double Arm   On back, knees bent, feet flat. Arms perpendicular to body, shoulder level, elbows straight but relaxed. Pull arms out to sides, elbows straight. Resistance band comes across collarbones, hands toward floor. Hold momentarily. Slowly return to starting position. Repeat _15__ times. Band color __y___   Sash   On back, knees bent, feet flat, left hand on left hip, right hand above left. Pull right arm DIAGONALLY (hip to shoulder) across chest. Bring right arm along head toward floor. Hold momentarily. Slowly return to starting position. Repeat __15_ times. Do with left arm. Band color __y____   Shoulder Rotation: Double Arm   On back, knees bent, feet flat, elbows tucked at sides, bent 90, hands palms up. Pull hands apart and down toward floor, keeping elbows near sides. Hold momentarily. Slowly return to starting position. Repeat _15y_ times. Band color __y____

## 2015-10-22 NOTE — Therapy (Addendum)
[place], [place], [address] Phone: [phone]   Fax:  [phone]  Physical Therapy Treatment / Discharge Note  Patient Details  Name: Amy Barron MRN: [id number] Date of Birth: Nov 26, 1940 Referring Provider: Lorin Mercy  Encounter Date: 10/22/2015      PT End of Session - 10/22/15 1005    Visit Number 19   Number of Visits 28   Date for PT Re-Evaluation 11/14/15   PT Start Time 1000   PT Stop Time 1100   PT Time Calculation (min) 60 min      Past Medical History  Diagnosis Date  . Hypertension   . Hyperlipidemia   . Stone, kidney     Past Surgical History  Procedure Laterality Date  . Abdominal hysterectomy  1998  . Parathyroidectomy    . Orif wrist fracture  01/18/2012    Procedure: OPEN REDUCTION INTERNAL FIXATION (ORIF) WRIST FRACTURE;  Surgeon: Marybelle Killings, MD;  Location: [place];  Service: Orthopedics;  Laterality: Left;  . Dilation and curettage of uterus    . Tubal ligation    . Colonoscopy w/ biopsies and polypectomy    . Multiple tooth extractions    . Orif humerus fracture Left 07/11/2015    Procedure: OPEN REDUCTION INTERNAL FIXATION (ORIF) PROXIMAL HUMERUS FRACTURE;  Surgeon: Marybelle Killings, MD;  Location: [place];  Service: Orthopedics;  Laterality: Left;    There were no vitals filed for this visit.  Visit Diagnosis:  Proximal humerus fracture, left, open, initial encounter  Pain in joint of left shoulder  Weakness of left arm      Subjective Assessment - 10/22/15 1002    Subjective I did yard work yesterday. Spread fertilizer, watered the roses. I had a lot of pain last night. Took tylenol. No pain this morning.   Currently in Pain? No/denies            [facility] PT Assessment - 10/22/15 0001    AROM   Left Shoulder Flexion --  AAROM 115                     [facility] Adult PT Treatment/Exercise - 10/22/15 0001    Shoulder Exercises: Supine   Other Supine  Exercises diagonals, horizontal abduction and ER yellow band. x 15 each, pullovers x 15    Shoulder Exercises: Standing   Flexion AAROM   Flexion Limitations bilateral wall slides with thumbs up   Other Standing Exercises Doorway stretch into ER. 30 sec x 3. IR stretch with towel, 30 sec x 3.   ER stretch for HEP    Other Standing Exercises reaching into cabinet mid shelf 1# low shelf 2#  10 x 2 each   Shoulder Exercises: Pulleys   Flexion 2 minutes   ABduction 2 minutes   Shoulder Exercises: ROM/Strengthening   UBE (Upper Arm Bike) 2 minutes forward, 2 min back, 2 min forward  no increased pain   Cryotherapy   Number Minutes Cryotherapy 15 Minutes   Cryotherapy Location Shoulder   Type of Cryotherapy Ice pack                PT Education - 10/22/15 1045    Education provided Yes   Education Details IR stretch behind back, supine scap stab   Person(s) Educated Patient   Methods Explanation;Handout   Comprehension Verbalized understanding          PT Short Term Goals - 10/06/15 1038  PT SHORT TERM GOAL #1   Title Pt will be I with initial HEP for continued strengthening and mobility by 09/18/15.   Time 4   Period Weeks   Status Achieved   PT SHORT TERM GOAL #2   Title L shoulder PROM will improve to 120 degrees pain-free for improved overhead reaching activities by 09/18/15.   Baseline L SHoulder flexion PROM 0-130   Time 4   Period Weeks   Status Achieved           PT Long Term Goals - 10/20/15 1048    PT LONG TERM GOAL #1   Title L shoulder strength flexion will improve to 3+/5 in order to lift 2# overhead x 10 reps without pain in order to return to pain-free functional activities for lifting overhead by 10/07/15.    Baseline 1# cabinet reaching   Time 8   Period Weeks   Status On-going   PT LONG TERM GOAL #2   Title FOTO score will improve from 59% limited to <34% to demo improved function and mobility by 10/07/15.    Baseline 09/22/15: FOTO score  improved from 59% at intake to  44%. Predicted 28%.    Time 8   Period Weeks   Status Unable to assess   PT LONG TERM GOAL #3   Title FER will improve to C7 in orfer for pt to wash her hair by 10/07/15.    Time 4   Period Weeks   Status Achieved   PT LONG TERM GOAL #4   Title L shoulder flexion AROM will imrpove to 120 degrees for overhead reaching by 10/07/15.    Baseline 95 best    Time 8   Period Weeks   Status On-going               Plan - 10/22/15 1040    Clinical Impression Statement Pt with continued difficulty perfroming ER stretch at doorway requiring max cues for technique. Instructed pt in supine scap and shoulder strengthening and added to HEP.    PT Next Visit Plan   AROM, cabinet reaching with weight. Review supine  theraband exercises for HEP. Review bilateral ER stretch on doorway and IR stretch        Problem List Patient Active Problem List   Diagnosis Date Noted  . GERD (gastroesophageal reflux disease) 08/14/2015  . Nausea with vomiting 08/14/2015  . Proximal humerus fracture 07/11/2015  . Allergic rhinitis 04/08/2015  . Distal radius fracture, left 01/19/2012    Class: Diagnosis of  . HLD (hyperlipidemia) 09/30/2010  . Essential hypertension 09/30/2010    [name], PTA 10/22/2015, 10:55 AM  [facility] [address] [place], [place], [address] Phone: [phone]   Fax:  [phone]  Name: Amy Barron MRN: [id number] Date of Birth: [birth year]      PHYSICAL THERAPY DISCHARGE SUMMARY  Visits from Start of Care: 19  Current functional level related to goals / functional outcomes: See goals   Remaining deficits: unknown   Education / Equipment: HEP,  Plan:                                                    Patient goals were partially met. Patient is being discharged due to not returning since the last visit.  ?????  Kristoffer Leamon PT, DPT, LAT, ATC   04/19/16  9:21 AM

## 2015-10-24 ENCOUNTER — Encounter: Payer: No Typology Code available for payment source | Admitting: Physical Therapy

## 2015-11-21 DIAGNOSIS — I708 Atherosclerosis of other arteries: Secondary | ICD-10-CM | POA: Diagnosis not present

## 2015-11-21 DIAGNOSIS — H409 Unspecified glaucoma: Secondary | ICD-10-CM | POA: Diagnosis not present

## 2015-11-21 DIAGNOSIS — H402230 Chronic angle-closure glaucoma, bilateral, stage unspecified: Secondary | ICD-10-CM | POA: Diagnosis not present

## 2015-12-16 DIAGNOSIS — S42252D Displaced fracture of greater tuberosity of left humerus, subsequent encounter for fracture with routine healing: Secondary | ICD-10-CM | POA: Diagnosis not present

## 2016-01-12 DIAGNOSIS — H402213 Chronic angle-closure glaucoma, right eye, severe stage: Secondary | ICD-10-CM | POA: Diagnosis not present

## 2016-01-12 DIAGNOSIS — H402221 Chronic angle-closure glaucoma, left eye, mild stage: Secondary | ICD-10-CM | POA: Diagnosis not present

## 2016-02-04 ENCOUNTER — Other Ambulatory Visit: Payer: Self-pay | Admitting: Family Medicine

## 2016-02-27 ENCOUNTER — Telehealth: Payer: Self-pay | Admitting: Family Medicine

## 2016-02-27 NOTE — Telephone Encounter (Signed)
LM for pt to sch AWV and CPE, mn °

## 2016-03-01 NOTE — Telephone Encounter (Signed)
Pt scheduled for cper and awv Pt aware

## 2016-04-15 ENCOUNTER — Other Ambulatory Visit: Payer: Self-pay | Admitting: Family Medicine

## 2016-04-15 DIAGNOSIS — Z01419 Encounter for gynecological examination (general) (routine) without abnormal findings: Secondary | ICD-10-CM | POA: Insufficient documentation

## 2016-04-15 DIAGNOSIS — E785 Hyperlipidemia, unspecified: Secondary | ICD-10-CM

## 2016-04-16 ENCOUNTER — Ambulatory Visit: Payer: Self-pay

## 2016-04-16 ENCOUNTER — Other Ambulatory Visit (INDEPENDENT_AMBULATORY_CARE_PROVIDER_SITE_OTHER): Payer: Medicare Other

## 2016-04-16 DIAGNOSIS — E785 Hyperlipidemia, unspecified: Secondary | ICD-10-CM | POA: Diagnosis not present

## 2016-04-16 DIAGNOSIS — Z0000 Encounter for general adult medical examination without abnormal findings: Secondary | ICD-10-CM | POA: Diagnosis not present

## 2016-04-16 DIAGNOSIS — Z01419 Encounter for gynecological examination (general) (routine) without abnormal findings: Secondary | ICD-10-CM

## 2016-04-16 LAB — CBC WITH DIFFERENTIAL/PLATELET
BASOS ABS: 0 10*3/uL (ref 0.0–0.1)
Basophils Relative: 0.5 % (ref 0.0–3.0)
EOS PCT: 1.9 % (ref 0.0–5.0)
Eosinophils Absolute: 0.1 10*3/uL (ref 0.0–0.7)
HCT: 39.5 % (ref 36.0–46.0)
Hemoglobin: 13.7 g/dL (ref 12.0–15.0)
LYMPHS ABS: 1.4 10*3/uL (ref 0.7–4.0)
Lymphocytes Relative: 23.2 % (ref 12.0–46.0)
MCHC: 34.8 g/dL (ref 30.0–36.0)
MCV: 91.1 fl (ref 78.0–100.0)
MONO ABS: 0.4 10*3/uL (ref 0.1–1.0)
MONOS PCT: 6.7 % (ref 3.0–12.0)
NEUTROS ABS: 4.1 10*3/uL (ref 1.4–7.7)
NEUTROS PCT: 67.7 % (ref 43.0–77.0)
PLATELETS: 286 10*3/uL (ref 150.0–400.0)
RBC: 4.34 Mil/uL (ref 3.87–5.11)
RDW: 13.4 % (ref 11.5–15.5)
WBC: 6 10*3/uL (ref 4.0–10.5)

## 2016-04-16 LAB — COMPREHENSIVE METABOLIC PANEL
ALT: 13 U/L (ref 0–35)
AST: 20 U/L (ref 0–37)
Albumin: 4 g/dL (ref 3.5–5.2)
Alkaline Phosphatase: 65 U/L (ref 39–117)
BUN: 14 mg/dL (ref 6–23)
CHLORIDE: 102 meq/L (ref 96–112)
CO2: 34 meq/L — AB (ref 19–32)
Calcium: 9.2 mg/dL (ref 8.4–10.5)
Creatinine, Ser: 0.84 mg/dL (ref 0.40–1.20)
GFR: 70.45 mL/min (ref >60.00)
GLUCOSE: 97 mg/dL (ref 70–99)
POTASSIUM: 3.8 meq/L (ref 3.5–5.1)
SODIUM: 142 meq/L (ref 135–145)
Total Bilirubin: 0.6 mg/dL (ref 0.2–1.2)
Total Protein: 6.8 g/dL (ref 6.0–8.3)

## 2016-04-16 LAB — TSH: TSH: 2.7 u[IU]/mL (ref 0.35–4.50)

## 2016-04-16 LAB — LIPID PANEL
CHOL/HDL RATIO: 3
Cholesterol: 202 mg/dL — ABNORMAL HIGH (ref 0–200)
HDL: 64.2 mg/dL (ref >39.00)
LDL CALC: 115 mg/dL — AB (ref 0–99)
NONHDL: 137.88
Triglycerides: 114 mg/dL (ref 0.0–149.0)
VLDL: 22.8 mg/dL (ref 0.0–40.0)

## 2016-04-20 ENCOUNTER — Encounter: Payer: No Typology Code available for payment source | Admitting: Family Medicine

## 2016-04-28 ENCOUNTER — Encounter: Payer: No Typology Code available for payment source | Admitting: Family Medicine

## 2016-05-06 ENCOUNTER — Other Ambulatory Visit: Payer: Self-pay | Admitting: Family Medicine

## 2016-05-13 DIAGNOSIS — H02056 Trichiasis without entropian left eye, unspecified eyelid: Secondary | ICD-10-CM | POA: Diagnosis not present

## 2016-05-13 DIAGNOSIS — H402213 Chronic angle-closure glaucoma, right eye, severe stage: Secondary | ICD-10-CM | POA: Diagnosis not present

## 2016-05-13 DIAGNOSIS — H402221 Chronic angle-closure glaucoma, left eye, mild stage: Secondary | ICD-10-CM | POA: Diagnosis not present

## 2016-05-13 DIAGNOSIS — H5712 Ocular pain, left eye: Secondary | ICD-10-CM | POA: Diagnosis not present

## 2016-05-13 DIAGNOSIS — H02055 Trichiasis without entropian left lower eyelid: Secondary | ICD-10-CM | POA: Diagnosis not present

## 2016-05-20 ENCOUNTER — Ambulatory Visit (INDEPENDENT_AMBULATORY_CARE_PROVIDER_SITE_OTHER): Payer: Medicare Other | Admitting: Family Medicine

## 2016-05-20 ENCOUNTER — Encounter: Payer: Self-pay | Admitting: Family Medicine

## 2016-05-20 VITALS — BP 128/66 | HR 58 | Temp 97.6°F | Ht 63.0 in | Wt 149.5 lb

## 2016-05-20 DIAGNOSIS — Z0000 Encounter for general adult medical examination without abnormal findings: Secondary | ICD-10-CM | POA: Diagnosis not present

## 2016-05-20 DIAGNOSIS — I10 Essential (primary) hypertension: Secondary | ICD-10-CM

## 2016-05-20 DIAGNOSIS — E785 Hyperlipidemia, unspecified: Secondary | ICD-10-CM | POA: Diagnosis not present

## 2016-05-20 DIAGNOSIS — K219 Gastro-esophageal reflux disease without esophagitis: Secondary | ICD-10-CM

## 2016-05-20 NOTE — Progress Notes (Signed)
Pre visit review using our clinic review tool, if applicable. No additional management support is needed unless otherwise documented below in the visit note. 

## 2016-05-20 NOTE — Assessment & Plan Note (Signed)
Well controlled with red yeast rice.  No changes made today.

## 2016-05-20 NOTE — Patient Instructions (Signed)
Great to see you!  Happy birthday! Your labs look fantastic.

## 2016-05-20 NOTE — Assessment & Plan Note (Signed)
Well controlled on current rx. No changes made today. 

## 2016-05-20 NOTE — Assessment & Plan Note (Signed)
The patients weight, height, BMI and visual acuity have been recorded in the chart.  Cognitive function assessed.   I have made referrals, counseling and provided education to the patient based review of the above and I have provided the pt with a written personalized care plan for preventive services.  

## 2016-05-20 NOTE — Progress Notes (Signed)
Subjective:   Patient ID: Amy Barron, female    DOB: 21-Aug-1940, 75 y.o.   MRN: [id number]  Amy Barron is a pleasant 75 y.o. year old female who presents to clinic today with Annual Exam (Medicare)  and follow up of chronic medical conditions on 05/20/2016  HPI: I have personally reviewed the Medicare Annual Wellness questionnaire and have noted 1. The patient's medical and social history 2. Their use of alcohol, tobacco or illicit drugs 3. Their current medications and supplements 4. The patient's functional ability including ADL's, fall risks, home safety risks and hearing or visual             impairment. 5. Diet and physical activities 6. Evidence for depression or mood disorders  End of life wishes discussed and updated in Social History.  The roster of all physicians providing medical care to patient - is listed in the CareTeams section of the chart.  Pneumovax 04/20/11 Prevnar 13 04/12/14 Colonoscopy 2/15/14Henrene Pastor- 5 year recall Mammogram 05/20/15, mammogram scheduled for tomorrow. DEXA 09/1214 Td 03/2009 Remote h/o hysterectomy  Still doing yoga.   HTN- has stable on current medications. Has been on Triamtere/HCTZ 37/5/25 and Amlodipine 5 mg daily for years. Denies CP, SOB, LE edema,or blurred vision. Lab Results  Component Value Date   CREATININE 0.84 04/16/2016   HLD- well controlled don Red yeast rice.  Lab Results  Component Value Date   CHOL 202 (H) 04/16/2016   HDL 64.20 04/16/2016   LDLCALC 115 (H) 04/16/2016   LDLDIRECT 159.1 10/09/2010   TRIG 114.0 04/16/2016   CHOLHDL 3 04/16/2016   Lab Results  Component Value Date   TSH 2.70 04/16/2016   Lab Results  Component Value Date   ALT 13 04/16/2016   AST 20 04/16/2016   ALKPHOS 65 04/16/2016   BILITOT 0.6 04/16/2016   Lab Results  Component Value Date   NA 142 04/16/2016   K 3.8 04/16/2016   CL 102 04/16/2016   CO2 34 (H) 04/16/2016   Current Outpatient Prescriptions on File Prior  to Visit  Medication Sig Dispense Refill  . amLODipine (NORVASC) 5 MG tablet TAKE 1 TABLET (5 MG TOTAL) BY MOUTH DAILY. OFFICE VISIT REQUIRED FOR ADDITIONAL REFILLS 30 tablet 0  . aspirin EC 81 MG tablet Take 81 mg by mouth daily.    Marland Kitchen etodolac (LODINE) 400 MG tablet TAKE 1 TAB BY MOUTH DAILY.  OFFICE VISIT REQUIRED FOR ADDITIONAL REFILLS 30 tablet 0  . promethazine (PHENERGAN) 25 MG tablet Take 25 mg by mouth every 6 (six) hours as needed. Reported on 08/18/2015  1  . Red Yeast Rice 600 MG TABS Take 1 tablet by mouth daily.     Marland Kitchen triamterene-hydrochlorothiazide (MAXZIDE-25) 37.5-25 MG tablet Take 1 tablet by mouth daily. OFFICE VISIT REQUIRED FOR ADDITIONAL REFILLS 30 tablet 0   No current facility-administered medications on file prior to visit.     No Known Allergies  Past Medical History:  Diagnosis Date  . Hyperlipidemia   . Hypertension   . Stone, kidney     Past Surgical History:  Procedure Laterality Date  . ABDOMINAL HYSTERECTOMY  1998  . COLONOSCOPY W/ BIOPSIES AND POLYPECTOMY    . DILATION AND CURETTAGE OF UTERUS    . MULTIPLE TOOTH EXTRACTIONS    . ORIF HUMERUS FRACTURE Left 07/11/2015   Procedure: OPEN REDUCTION INTERNAL FIXATION (ORIF) PROXIMAL HUMERUS FRACTURE;  Surgeon: Marybelle Killings, MD;  Location: [place];  Service: Orthopedics;  Laterality: Left;  .  ORIF WRIST FRACTURE  01/18/2012   Procedure: OPEN REDUCTION INTERNAL FIXATION (ORIF) WRIST FRACTURE;  Surgeon: Marybelle Killings, MD;  Location: [place];  Service: Orthopedics;  Laterality: Left;  . PARATHYROIDECTOMY    . TUBAL LIGATION      Family History  Problem Relation Age of Onset  . Cancer Father   . Colon cancer Father 34  . Diabetes Other     Social History   Social History  . Marital status: Married    Spouse name: N/A  . Number of children: N/A  . Years of education: N/A   Occupational History  . Not on file.   Social History Main Topics  . Smoking status: Never Smoker  . Smokeless tobacco: Never  Used  . Alcohol use No  . Drug use: No  . Sexual activity: No   Other Topics Concern  . Not on file   Social History Narrative   Desires CPR.   Would not want prolonged life support if futile   She does have a Living Will.   The PMH, PSH, Social History, Family History, Medications, and allergies have been reviewed in [facility], and have been updated if relevant.   Review of Systems  Constitutional: Negative.   HENT: Negative.   Eyes: Negative.   Respiratory: Negative.   Cardiovascular: Negative.   Gastrointestinal: Negative.   Endocrine: Negative.   Genitourinary: Negative.   Musculoskeletal: Negative.   Skin: Negative.   Allergic/Immunologic: Negative.   Neurological: Negative.   Hematological: Negative.   All other systems reviewed and are negative.      Objective:    BP 128/66   Pulse (!) 58   Temp 97.6 F (36.4 C) (Oral)   Ht 5\' 3"  (1.6 m)   Wt 149 lb 8 oz (67.8 kg)   SpO2 97%   BMI 26.48 kg/m   Wt Readings from Last 3 Encounters:  05/20/16 149 lb 8 oz (67.8 kg)  08/14/15 139 lb 12 oz (63.4 kg)  07/30/15 141 lb (64 kg)    Physical Exam  Constitutional: She is oriented to person, place, and time. She appears well-developed and well-nourished. No distress.  HENT:  Head: Normocephalic and atraumatic.  Eyes: Conjunctivae are normal.  Neck: Normal range of motion. Neck supple.  Cardiovascular: Normal rate, regular rhythm and normal heart sounds.   Pulmonary/Chest: Effort normal and breath sounds normal. No respiratory distress.  Abdominal: Soft.  Musculoskeletal: Normal range of motion. She exhibits no edema.  Neurological: She is alert and oriented to person, place, and time. No cranial nerve deficit.  Skin: Skin is warm and dry.  Psychiatric: She has a normal mood and affect. Her behavior is normal. Judgment normal.  Nursing note and vitals reviewed.         Assessment & Plan:   Medicare annual wellness visit, subsequent  Essential  hypertension  Gastroesophageal reflux disease, esophagitis presence not specified  Hyperlipidemia, unspecified hyperlipidemia type No Follow-up on file.

## 2016-05-21 ENCOUNTER — Encounter: Payer: Self-pay | Admitting: Family Medicine

## 2016-05-21 DIAGNOSIS — Z803 Family history of malignant neoplasm of breast: Secondary | ICD-10-CM | POA: Diagnosis not present

## 2016-05-21 DIAGNOSIS — Z1231 Encounter for screening mammogram for malignant neoplasm of breast: Secondary | ICD-10-CM | POA: Diagnosis not present

## 2016-06-04 ENCOUNTER — Other Ambulatory Visit: Payer: Self-pay | Admitting: Family Medicine

## 2016-07-26 DIAGNOSIS — H269 Unspecified cataract: Secondary | ICD-10-CM

## 2016-07-26 HISTORY — DX: Unspecified cataract: H26.9

## 2016-08-04 ENCOUNTER — Ambulatory Visit (INDEPENDENT_AMBULATORY_CARE_PROVIDER_SITE_OTHER): Payer: Medicare Other | Admitting: Family Medicine

## 2016-08-04 VITALS — BP 122/76 | HR 73 | Temp 98.1°F | Wt 147.5 lb

## 2016-08-04 DIAGNOSIS — J069 Acute upper respiratory infection, unspecified: Secondary | ICD-10-CM | POA: Diagnosis not present

## 2016-08-04 MED ORDER — AMOXICILLIN 875 MG PO TABS: 875.0000 mg | ORAL_TABLET | Freq: Two times a day (BID) | ORAL | 0 refills | Status: DC

## 2016-08-04 NOTE — Progress Notes (Signed)
Pre visit review using our clinic review tool, if applicable. No additional management support is needed unless otherwise documented below in the visit note. 

## 2016-08-04 NOTE — Progress Notes (Signed)
SUBJECTIVE:  Amy Barron is a 76 y.o. female who complains of coryza, congestion, sinus pressure and dry cough for 3 weeks.  She denies a history of anorexia and chest pain and denies a history of asthma. Patient denies smoke cigarettes.   Current Outpatient Prescriptions on File Prior to Visit  Medication Sig Dispense Refill  . amLODipine (NORVASC) 5 MG tablet Take 1 tablet (5 mg total) by mouth daily. TAKE 1 TABLET (5 MG TOTAL) BY MOUTH DAILY. 90 tablet 2  . aspirin EC 81 MG tablet Take 81 mg by mouth daily.    Marland Kitchen etodolac (LODINE) 400 MG tablet Take 1 tablet (400 mg total) by mouth daily. TAKE 1 TAB BY MOUTH DAILY. 90 tablet 2  . promethazine (PHENERGAN) 25 MG tablet Take 25 mg by mouth every 6 (six) hours as needed. Reported on 08/18/2015  1  . Red Yeast Rice 600 MG TABS Take 1 tablet by mouth daily.     Marland Kitchen triamterene-hydrochlorothiazide (MAXZIDE-25) 37.5-25 MG tablet Take 1 tablet by mouth daily. 90 tablet 2   No current facility-administered medications on file prior to visit.     No Known Allergies  Past Medical History:  Diagnosis Date  . Hyperlipidemia   . Hypertension   . Stone, kidney     Past Surgical History:  Procedure Laterality Date  . ABDOMINAL HYSTERECTOMY  1998  . COLONOSCOPY W/ BIOPSIES AND POLYPECTOMY    . DILATION AND CURETTAGE OF UTERUS    . MULTIPLE TOOTH EXTRACTIONS    . ORIF HUMERUS FRACTURE Left 07/11/2015   Procedure: OPEN REDUCTION INTERNAL FIXATION (ORIF) PROXIMAL HUMERUS FRACTURE;  Surgeon: Marybelle Killings, MD;  Location: [place];  Service: Orthopedics;  Laterality: Left;  . ORIF WRIST FRACTURE  01/18/2012   Procedure: OPEN REDUCTION INTERNAL FIXATION (ORIF) WRIST FRACTURE;  Surgeon: Marybelle Killings, MD;  Location: [place];  Service: Orthopedics;  Laterality: Left;  . PARATHYROIDECTOMY    . TUBAL LIGATION      Family History  Problem Relation Age of Onset  . Cancer Father   . Colon cancer Father 56  . Diabetes Other     Social History   Social  History  . Marital status: Married    Spouse name: N/A  . Number of children: N/A  . Years of education: N/A   Occupational History  . Not on file.   Social History Main Topics  . Smoking status: Never Smoker  . Smokeless tobacco: Never Used  . Alcohol use No  . Drug use: No  . Sexual activity: No   Other Topics Concern  . Not on file   Social History Narrative   Desires CPR.   Would not want prolonged life support if futile   She does have a Living Will.   The PMH, PSH, Social History, Family History, Medications, and allergies have been reviewed in [facility], and have been updated if relevant.  OBJECTIVE: BP 122/76   Pulse 73   Temp 98.1 F (36.7 C) (Oral)   Wt 147 lb 8 oz (66.9 kg)   SpO2 96%   BMI 26.13 kg/m   She appears well, vital signs are as noted. Ears normal.  Throat and pharynx normal.  Neck supple. No adenopathy in the neck. Nose is congested. Sinuses tender. The chest is clear, without wheezes or rales.  ASSESSMENT:  sinusitis  PLAN: Given duration and progression of symptoms, will treat for bacterial sinusitis with amoxicillin.  Symptomatic therapy suggested: push fluids, rest and  return office visit prn if symptoms persist or worsen. Call or return to clinic prn if these symptoms worsen or fail to improve as anticipated.

## 2016-08-04 NOTE — Patient Instructions (Signed)
Take antibiotic as directed.  Drink lots of fluids.   Treat sympotmatically with Mucinex, nasal saline irrigation, and Tylenol/Ibuprofen.  Try over the counter nasocort-start with 2 sprays per nostril per day...and then try to taper to 1 spray per nostril once symptoms improve.   You can use warm compresses.     Call if not improving as expected in 5-7 days.

## 2016-10-12 ENCOUNTER — Other Ambulatory Visit: Payer: Self-pay | Admitting: Dermatology

## 2016-10-12 DIAGNOSIS — C44311 Basal cell carcinoma of skin of nose: Secondary | ICD-10-CM | POA: Diagnosis not present

## 2016-10-12 DIAGNOSIS — Z85828 Personal history of other malignant neoplasm of skin: Secondary | ICD-10-CM | POA: Diagnosis not present

## 2016-10-12 DIAGNOSIS — L905 Scar conditions and fibrosis of skin: Secondary | ICD-10-CM | POA: Diagnosis not present

## 2016-10-12 DIAGNOSIS — L570 Actinic keratosis: Secondary | ICD-10-CM | POA: Diagnosis not present

## 2016-10-24 HISTORY — PX: CATARACT EXTRACTION W/ INTRAOCULAR LENS  IMPLANT, BILATERAL: SHX1307

## 2016-10-25 DIAGNOSIS — M8589 Other specified disorders of bone density and structure, multiple sites: Secondary | ICD-10-CM | POA: Diagnosis not present

## 2016-10-27 IMAGING — DX DG SHOULDER 2+V*L*
3 series · 3 of 3 positions shown · non-contrast
Comparison: None.

CLINICAL DATA: Fall, shoulder pain

EXAM:
LEFT SHOULDER - 2+ VIEW

[shoulder grashey]
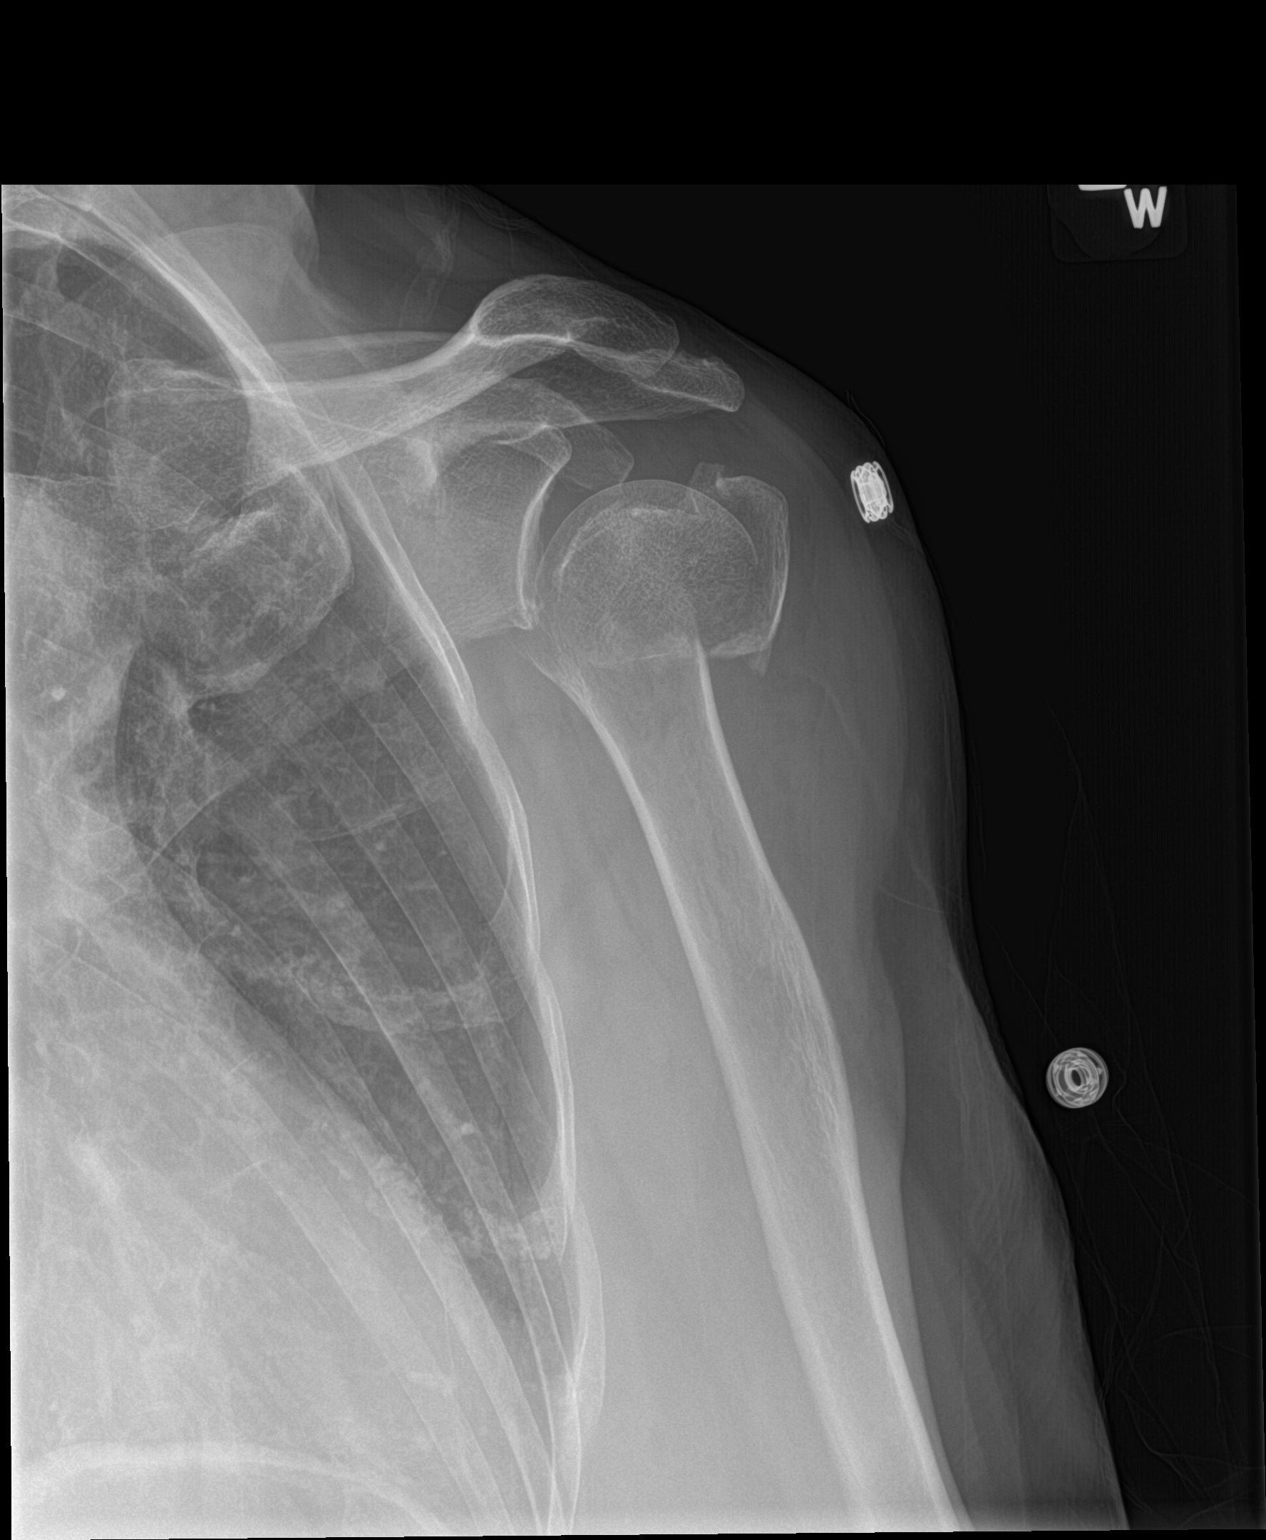

[shoulder y view]
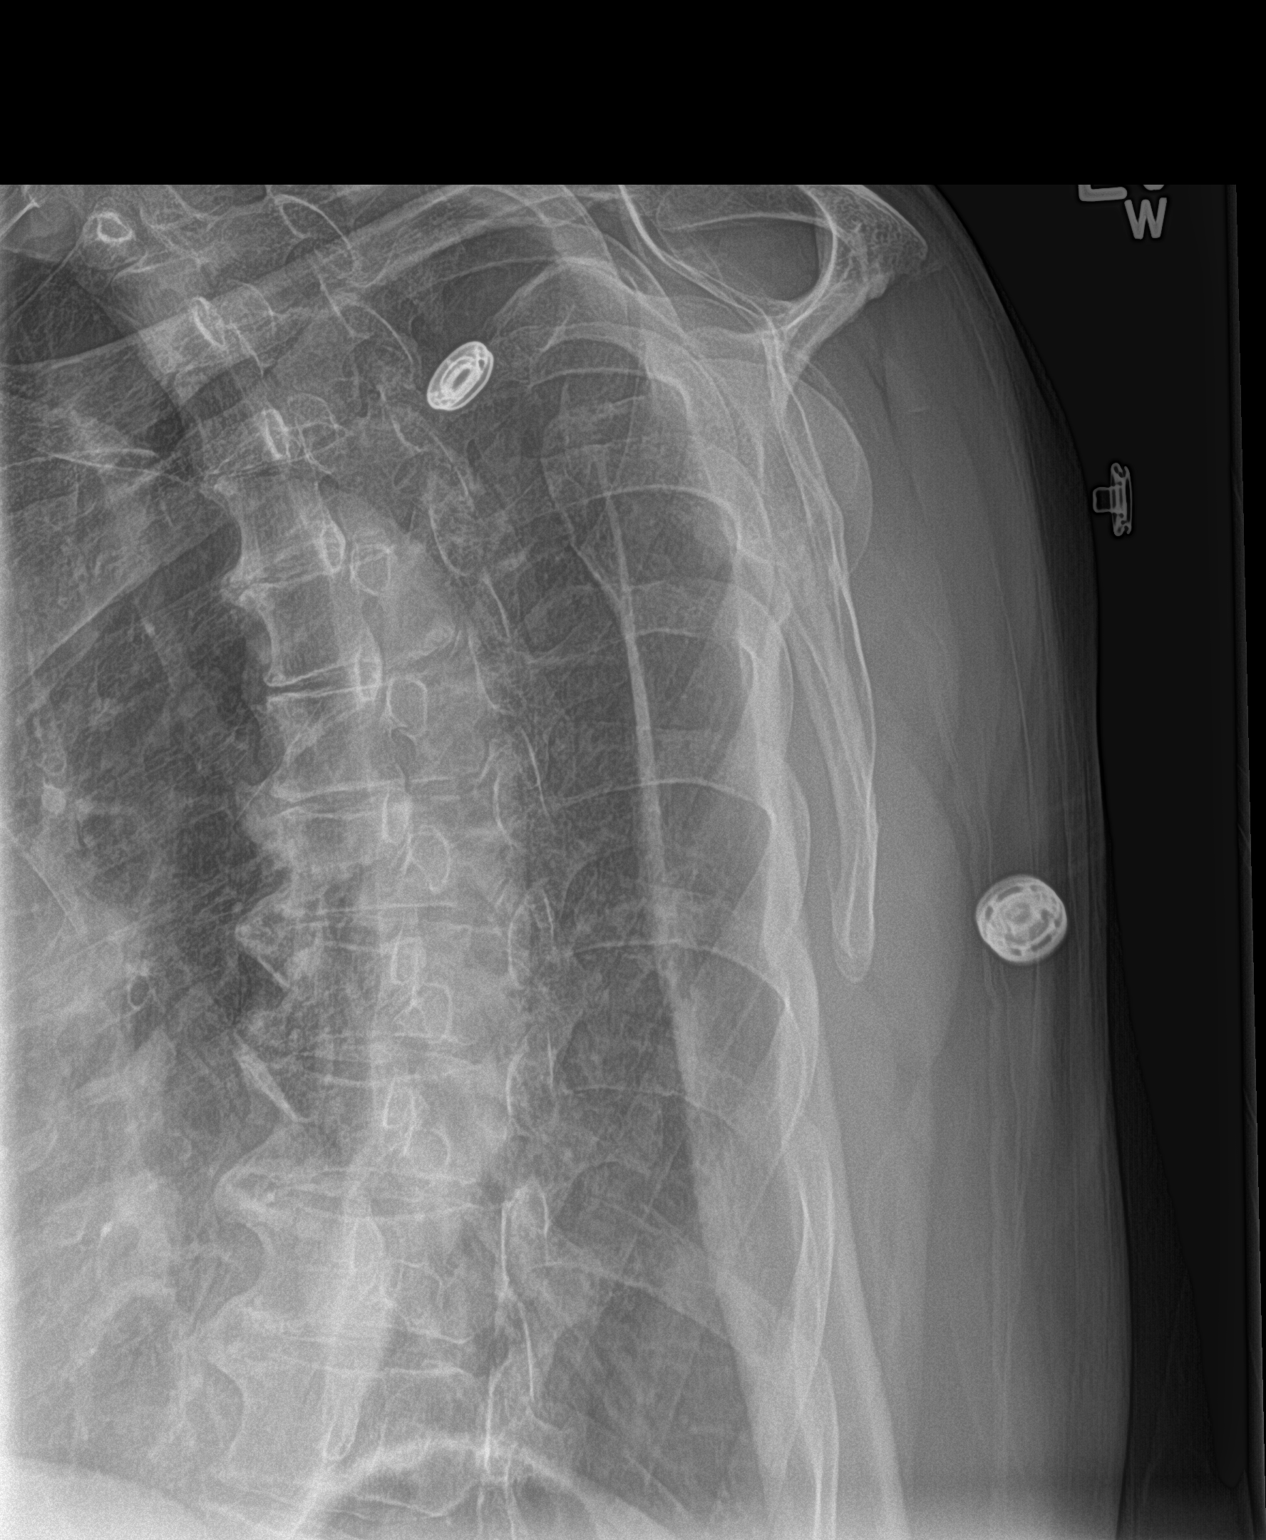

[shoulder tsy view]
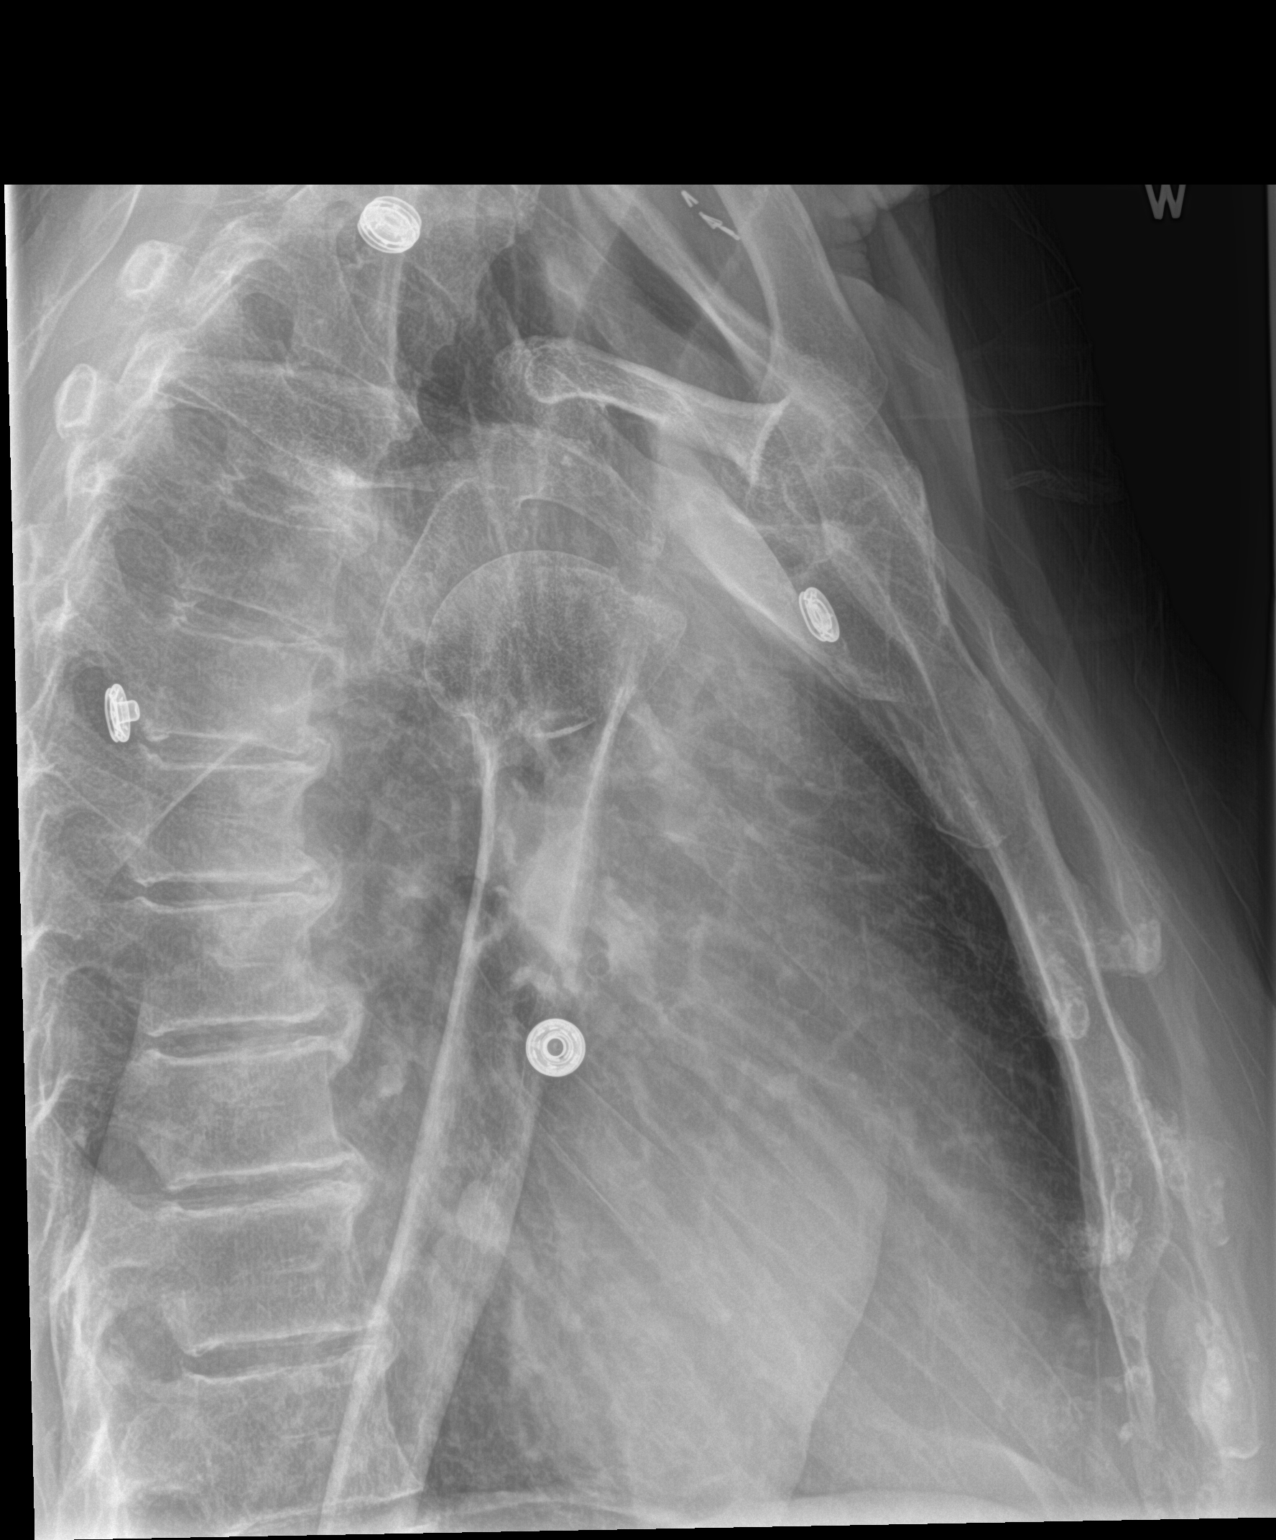

[3 of 3 positions shown; findings below may reference images not displayed]

FINDINGS: Three views of the left humerus submitted. There is displaced
fracture of the left humeral neck.
IMPRESSION: Displaced fracture of the left humeral neck.

## 2016-10-29 ENCOUNTER — Encounter: Payer: Self-pay | Admitting: Family Medicine

## 2016-11-01 ENCOUNTER — Telehealth: Payer: Self-pay | Admitting: Family Medicine

## 2016-11-01 NOTE — Telephone Encounter (Signed)
Spoke with patient & gave Bone Density results

## 2016-11-01 NOTE — Telephone Encounter (Signed)
Pt returning call about dexa results cb number is

## 2016-11-03 DIAGNOSIS — H402221 Chronic angle-closure glaucoma, left eye, mild stage: Secondary | ICD-10-CM | POA: Diagnosis not present

## 2016-11-03 DIAGNOSIS — H25013 Cortical age-related cataract, bilateral: Secondary | ICD-10-CM | POA: Diagnosis not present

## 2016-11-03 DIAGNOSIS — H35033 Hypertensive retinopathy, bilateral: Secondary | ICD-10-CM | POA: Diagnosis not present

## 2016-11-03 DIAGNOSIS — H353131 Nonexudative age-related macular degeneration, bilateral, early dry stage: Secondary | ICD-10-CM | POA: Diagnosis not present

## 2016-11-03 DIAGNOSIS — H2513 Age-related nuclear cataract, bilateral: Secondary | ICD-10-CM | POA: Diagnosis not present

## 2016-11-03 DIAGNOSIS — H402213 Chronic angle-closure glaucoma, right eye, severe stage: Secondary | ICD-10-CM | POA: Diagnosis not present

## 2016-11-05 ENCOUNTER — Encounter: Payer: Self-pay | Admitting: Family Medicine

## 2016-11-05 ENCOUNTER — Ambulatory Visit (INDEPENDENT_AMBULATORY_CARE_PROVIDER_SITE_OTHER): Payer: Medicare Other | Admitting: Family Medicine

## 2016-11-05 VITALS — BP 128/70 | HR 81 | Temp 99.1°F | Wt 145.5 lb

## 2016-11-05 DIAGNOSIS — R6889 Other general symptoms and signs: Secondary | ICD-10-CM

## 2016-11-05 MED ORDER — OSELTAMIVIR PHOSPHATE 75 MG PO CAPS: 75.0000 mg | ORAL_CAPSULE | Freq: Two times a day (BID) | ORAL | 0 refills | Status: DC

## 2016-11-05 NOTE — Progress Notes (Signed)
Pre visit review using our clinic review tool, if applicable. No additional management support is needed unless otherwise documented below in the visit note. 

## 2016-11-05 NOTE — Progress Notes (Signed)
2 days of sx. Temp 101 yesterday.  Diarrhea.  Chills.  She feels like she has the flu.  No vomiting.  Some nausea.  Cough.  No sputum, "I wish I could get something up."  No rash.  She feels worse around 3pm or later.  Temp 100 this AM.  Diffuse aches.    Sick contacts at church noted.   Meds, vitals, and allergies reviewed.   ROS: Per HPI unless specifically indicated in ROS section   GEN: nad, alert and oriented HEENT: mucous membranes moist, tm w/o erythema, nasal exam w/o erythema, clear discharge noted,  OP with cobblestoning NECK: supple w/o LA CV: rrr.   PULM: ctab, no inc wob EXT: no edema

## 2016-11-05 NOTE — Patient Instructions (Addendum)
Presumed flu.  Drink plenty of fluids, take tylenol as needed, and gargle with warm salt water for your throat if needed.  Start tamiflu.   This should gradually improve.  Take care.  Let us know if you have other concerns.   Glad to see you.

## 2016-11-07 DIAGNOSIS — R6889 Other general symptoms and signs: Secondary | ICD-10-CM | POA: Insufficient documentation

## 2016-11-07 NOTE — Assessment & Plan Note (Signed)
Presumed flu. No test available in the clinic currently. Clinical diagnosis. Lungs clear. Okay for outpatient follow-up. Start Tamiflu. She agrees. Update me as needed. Supportive care. Nontoxic.

## 2016-11-12 ENCOUNTER — Encounter (HOSPITAL_COMMUNITY): Payer: Self-pay | Admitting: *Deleted

## 2016-11-12 ENCOUNTER — Emergency Department (HOSPITAL_COMMUNITY): Payer: Medicare Other

## 2016-11-12 ENCOUNTER — Emergency Department (HOSPITAL_COMMUNITY)
Admission: EM | Admit: 2016-11-12 | Discharge: 2016-11-12 | Disposition: A | Discharge status: Home / Self Care | Payer: Medicare Other | Attending: Emergency Medicine | Admitting: Emergency Medicine

## 2016-11-12 DIAGNOSIS — Z7982 Long term (current) use of aspirin: Secondary | ICD-10-CM | POA: Diagnosis not present

## 2016-11-12 DIAGNOSIS — E860 Dehydration: Secondary | ICD-10-CM | POA: Diagnosis not present

## 2016-11-12 DIAGNOSIS — I10 Essential (primary) hypertension: Secondary | ICD-10-CM | POA: Diagnosis not present

## 2016-11-12 DIAGNOSIS — K2970 Gastritis, unspecified, without bleeding: Secondary | ICD-10-CM | POA: Diagnosis not present

## 2016-11-12 DIAGNOSIS — R05 Cough: Secondary | ICD-10-CM | POA: Diagnosis not present

## 2016-11-12 DIAGNOSIS — R110 Nausea: Secondary | ICD-10-CM | POA: Diagnosis not present

## 2016-11-12 DIAGNOSIS — R197 Diarrhea, unspecified: Secondary | ICD-10-CM

## 2016-11-12 DIAGNOSIS — Z79899 Other long term (current) drug therapy: Secondary | ICD-10-CM | POA: Insufficient documentation

## 2016-11-12 DIAGNOSIS — R109 Unspecified abdominal pain: Secondary | ICD-10-CM | POA: Diagnosis present

## 2016-11-12 LAB — COMPREHENSIVE METABOLIC PANEL
ALK PHOS: 63 U/L (ref 38–126)
ALT: 14 U/L (ref 14–54)
AST: 22 U/L (ref 15–41)
Albumin: 4.4 g/dL (ref 3.5–5.0)
Anion gap: 13 (ref 5–15)
BUN: 11 mg/dL (ref 6–20)
CALCIUM: 10.1 mg/dL (ref 8.9–10.3)
CO2: 27 mmol/L (ref 22–32)
CREATININE: 0.86 mg/dL (ref 0.44–1.00)
Chloride: 99 mmol/L — ABNORMAL LOW (ref 101–111)
Glucose, Bld: 121 mg/dL — ABNORMAL HIGH (ref 65–99)
Potassium: 3.1 mmol/L — ABNORMAL LOW (ref 3.5–5.1)
Sodium: 139 mmol/L (ref 135–145)
Total Bilirubin: 0.8 mg/dL (ref 0.3–1.2)
Total Protein: 7.1 g/dL (ref 6.5–8.1)

## 2016-11-12 LAB — CBC WITH DIFFERENTIAL/PLATELET
BASOS ABS: 0 10*3/uL (ref 0.0–0.1)
BASOS PCT: 0 %
EOS PCT: 0 %
Eosinophils Absolute: 0 10*3/uL (ref 0.0–0.7)
HCT: 44.2 % (ref 36.0–46.0)
Hemoglobin: 14.9 g/dL (ref 12.0–15.0)
LYMPHS PCT: 17 %
Lymphs Abs: 1.5 10*3/uL (ref 0.7–4.0)
MCH: 30.9 pg (ref 26.0–34.0)
MCHC: 33.7 g/dL (ref 30.0–36.0)
MCV: 91.7 fL (ref 78.0–100.0)
Monocytes Absolute: 0.5 10*3/uL (ref 0.1–1.0)
Monocytes Relative: 6 %
Neutro Abs: 6.7 10*3/uL (ref 1.7–7.7)
Neutrophils Relative %: 77 %
PLATELETS: 279 10*3/uL (ref 150–400)
RBC: 4.82 MIL/uL (ref 3.87–5.11)
RDW: 13.6 % (ref 11.5–15.5)
WBC: 8.7 10*3/uL (ref 4.0–10.5)

## 2016-11-12 LAB — LIPASE, BLOOD: LIPASE: 20 U/L (ref 11–51)

## 2016-11-12 MED ORDER — ONDANSETRON HCL 4 MG PO TABS: 4.0000 mg | ORAL_TABLET | Freq: Four times a day (QID) | ORAL | 0 refills | Status: DC

## 2016-11-12 MED ORDER — ONDANSETRON HCL 4 MG/2ML IJ SOLN
4.0000 mg | Freq: Once | INTRAMUSCULAR | Status: AC
  Administered 2016-11-12: 4 mg via INTRAVENOUS
  Filled 2016-11-12: qty 2

## 2016-11-12 MED ORDER — SODIUM CHLORIDE 0.9 % IV BOLUS (SEPSIS)
2000.0000 mL | Freq: Once | INTRAVENOUS | Status: AC
  Administered 2016-11-12: 2000 mL via INTRAVENOUS

## 2016-11-12 NOTE — ED Provider Notes (Signed)
[place] DEPT Provider Note   CSN: [id number] Arrival date & time: 11/12/16  7517     History   Chief Complaint Chief Complaint  Patient presents with  . Abdominal Pain    HPI Amy Barron is a 76 y.o. female.  Patient is a 77 year old female who is relatively healthy with a history of hypertension presenting today with general fatigue, weakness and persistent diarrhea. Patient states 10 days ago she developed symptoms of cough, congestion, nausea, vomiting and fever. Patient was seen by her doctor 7 days ago and at that time was diagnosed with flu given a positive flu swab. She started Tamiflu and completed the course. She states the congestion and cough have improved the fevers have started to improve but she continues to have vomiting. She has no appetite but is trying to drink fluids. She is having 5-6 episodes of diarrhea daily. She denies any recent antibiotic use. No recent hospitalizations. No localized abdominal pain but just mild soreness throughout.  She denies any urinary complaints.   The history is provided by the patient.  Abdominal Pain      Past Medical History:  Diagnosis Date  . Hyperlipidemia   . Hypertension   . Stone, kidney     Patient Active Problem List   Diagnosis Date Noted  . Flu-like symptoms 11/07/2016  . Medicare annual wellness visit, subsequent 05/20/2016  . Well woman exam 04/15/2016  . GERD (gastroesophageal reflux disease) 08/14/2015  . Proximal humerus fracture 07/11/2015  . Allergic rhinitis 04/08/2015  . HLD (hyperlipidemia) 09/30/2010  . Essential hypertension 09/30/2010    Past Surgical History:  Procedure Laterality Date  . ABDOMINAL HYSTERECTOMY  1998  . COLONOSCOPY W/ BIOPSIES AND POLYPECTOMY    . DILATION AND CURETTAGE OF UTERUS    . MULTIPLE TOOTH EXTRACTIONS    . ORIF HUMERUS FRACTURE Left 07/11/2015   Procedure: OPEN REDUCTION INTERNAL FIXATION (ORIF) PROXIMAL HUMERUS FRACTURE;  Surgeon: Marybelle Killings, MD;   Location: [place];  Service: Orthopedics;  Laterality: Left;  . ORIF WRIST FRACTURE  01/18/2012   Procedure: OPEN REDUCTION INTERNAL FIXATION (ORIF) WRIST FRACTURE;  Surgeon: Marybelle Killings, MD;  Location: [place];  Service: Orthopedics;  Laterality: Left;  . PARATHYROIDECTOMY    . TUBAL LIGATION      OB History    No data available       Home Medications    Prior to Admission medications   Medication Sig Start Date End Date Taking? Authorizing Provider  amLODipine (NORVASC) 5 MG tablet Take 1 tablet (5 mg total) by mouth daily. TAKE 1 TABLET (5 MG TOTAL) BY MOUTH DAILY. 06/07/16  Yes Lucille Passy, MD  aspirin EC 81 MG tablet Take 81 mg by mouth daily.   Yes Historical Provider, MD  etodolac (LODINE) 400 MG tablet Take 1 tablet (400 mg total) by mouth daily. TAKE 1 TAB BY MOUTH DAILY. 06/07/16  Yes Lucille Passy, MD  Multiple Vitamin (MULTIVITAMIN) tablet Take 1 tablet by mouth daily.   Yes Historical Provider, MD  promethazine (PHENERGAN) 25 MG tablet Take 25 mg by mouth every 6 (six) hours as needed. Reported on 08/18/2015 07/16/15  Yes Historical Provider, MD  Red Yeast Rice 600 MG TABS Take 1 tablet by mouth daily.    Yes Historical Provider, MD  triamterene-hydrochlorothiazide (MAXZIDE-25) 37.5-25 MG tablet Take 1 tablet by mouth daily. 06/07/16  Yes Lucille Passy, MD  oseltamivir (TAMIFLU) 75 MG capsule Take 1 capsule (75 mg total) by  mouth 2 (two) times daily. Patient not taking: Reported on 11/12/2016 11/05/16   Tonia Ghent, MD    Family History Family History  Problem Relation Age of Onset  . Cancer Father   . Colon cancer Father 64  . Diabetes Other     Social History Social History  Substance Use Topics  . Smoking status: Never Smoker  . Smokeless tobacco: Never Used  . Alcohol use No     Allergies   Patient has no known allergies.   Review of Systems Review of Systems  Gastrointestinal: Positive for abdominal pain.  All other systems reviewed and are  negative.    Physical Exam Updated Vital Signs BP (!) 142/100 (BP Location: Right Arm)   Pulse 68   Temp 98 F (36.7 C) (Oral)   Resp 18   SpO2 100%   Physical Exam  Constitutional: She is oriented to person, place, and time. She appears well-developed and well-nourished. No distress.  HENT:  Head: Normocephalic and atraumatic.  Mouth/Throat: Mucous membranes are dry. No oropharyngeal exudate.  Eyes: Conjunctivae and EOM are normal. Pupils are equal, round, and reactive to light.  Neck: Normal range of motion. Neck supple.  Cardiovascular: Normal rate, regular rhythm and intact distal pulses.   No murmur heard. Pulmonary/Chest: Effort normal and breath sounds normal. No respiratory distress. She has no wheezes. She has no rales.  Abdominal: Soft. She exhibits no distension. There is no tenderness. There is no rebound and no guarding.  Musculoskeletal: Normal range of motion. She exhibits no edema or tenderness.  Neurological: She is alert and oriented to person, place, and time.  Skin: Skin is warm and dry. No rash noted. No erythema.  Psychiatric: She has a normal mood and affect. Her behavior is normal.  Nursing note and vitals reviewed.    ED Treatments / Results  Labs (all labs ordered are listed, but only abnormal results are displayed) Labs Reviewed  COMPREHENSIVE METABOLIC PANEL - Abnormal; Notable for the following:       Result Value   Potassium 3.1 (*)    Chloride 99 (*)    Glucose, Bld 121 (*)    All other components within normal limits  CBC WITH DIFFERENTIAL/PLATELET  LIPASE, BLOOD    EKG  EKG Interpretation None       Radiology Dg Chest 2 View  Result Date: 11/12/2016 CLINICAL DATA:  Cough EXAM: CHEST  2 VIEW COMPARISON:  07/30/2015 FINDINGS: Heart size and vascularity normal. Lungs are clear without infiltrate effusion or mass. Fracture fixation with hardware in the proximal left humerus. Surgical clips in the right thyroid region. IMPRESSION: No  active cardiopulmonary disease. Electronically Signed   By: Franchot Gallo M.D.   On: 11/12/2016 11:57    Procedures Procedures (including critical care time)  Medications Ordered in ED Medications  sodium chloride 0.9 % bolus 2,000 mL (2,000 mLs Intravenous New Bag/Given 11/12/16 1036)  ondansetron (ZOFRAN) injection 4 mg (4 mg Intravenous Given 11/12/16 1033)     Initial Impression / Assessment and Plan / ED Course  I have reviewed the triage vital signs and the nursing notes.  Pertinent labs & imaging results that were available during my care of the patient were reviewed by me and considered in my medical decision making (see chart for details).    Patient presenting today with persistent diarrhea, generalized weakness and most likely dehydration after having the flu for the last 10 days. Patient's temperature has resolved and is only been as  high as 99 at home for the last few days. She has no urinary complaints and low suspicion for GU pathology. She has no focal abdominal tenderness concerning for diverticulitis, appendicitis, pancreatitis. Vomiting has resolved but ongoing nausea. Off has also improved and lung sounds are clear. Will ensure no post flu pneumonia. Also will ensure normal electrolytes. Patient ordered IV fluids. CBC within normal limits. CMP and lipase pending.  12:11 PM Mild hypokalemia but o/w labs unremarkable  Pt feeling better after fluids.  Will po challenge.  Pt feeling much better and tolerating po's.  Final Clinical Impressions(s) / ED Diagnoses   Final diagnoses:  Diarrhea, unspecified type  Dehydration    New Prescriptions Discharge Medication List as of 11/12/2016  1:25 PM    START taking these medications   Details  ondansetron (ZOFRAN) 4 MG tablet Take 1 tablet (4 mg total) by mouth every 6 (six) hours., Starting Fri 11/12/2016, Print         Blanchie Dessert, MD 11/12/16 [phone]

## 2016-11-12 NOTE — ED Notes (Signed)
Pt was given crackers and ginger ale per MD.

## 2016-11-12 NOTE — ED Triage Notes (Signed)
Pt reports having the flu 3 weeks ago. Pt states that she tested positive for the flu on Thursday. Pt was given tamiflu which she has completed. Pt states that she continues to have a cough when she lays down. Pt states that she also has decreased appetite and diarrhea for 3 days. Pt had positive orthostatics with EMS (sitting 136/82 HR 86 standing 106/75 HR 98).

## 2016-11-16 ENCOUNTER — Ambulatory Visit (INDEPENDENT_AMBULATORY_CARE_PROVIDER_SITE_OTHER): Payer: Medicare Other | Admitting: Family Medicine

## 2016-11-16 ENCOUNTER — Encounter: Payer: Self-pay | Admitting: Family Medicine

## 2016-11-16 VITALS — BP 130/78 | HR 83 | Temp 98.3°F | Wt 144.5 lb

## 2016-11-16 DIAGNOSIS — R6889 Other general symptoms and signs: Secondary | ICD-10-CM

## 2016-11-16 DIAGNOSIS — R1030 Lower abdominal pain, unspecified: Secondary | ICD-10-CM | POA: Diagnosis not present

## 2016-11-16 LAB — POC URINALSYSI DIPSTICK (AUTOMATED)
Glucose, UA: NEGATIVE
Ketones, UA: NEGATIVE
Leukocytes, UA: NEGATIVE
NITRITE UA: NEGATIVE
PROTEIN UA: NEGATIVE
RBC UA: NEGATIVE
SPEC GRAV UA: 1.015 (ref 1.010–1.025)
UROBILINOGEN UA: 0.2 U/dL
pH, UA: 7 (ref 5.0–8.0)

## 2016-11-16 NOTE — Patient Instructions (Signed)
Go to the lab on the way out.  We'll contact you with your lab report. Hold the triamterene/HCTZ for a few days.  Try flat ginger ale in the meantime as needed for nausea.  Take care.  Glad to see you.  Get some rest.

## 2016-11-16 NOTE — Progress Notes (Signed)
Pre visit review using our clinic review tool, if applicable. No additional management support is needed unless otherwise documented below in the visit note. 

## 2016-11-16 NOTE — Progress Notes (Signed)
Last office visit reviewed with patient, re: presumed flu diagnosis. She was able to take tamiflu after OV here 11/05/16.   She prev had fevers but that resolved.  Cough prev noted, better prior to the ER.  Then she felt progressively worse and went to ER with dehydration.  K was noted to be low at ER.  She was given IV fluids and felt well enough to be discharged. Here for follow-up today. She is not back to baseline.  She feels weak still and nauseated every AM- nausea gets some better later on in the day but she still feels weak.    Prev with dark green diarrhea, now still discolored/dark green but more solid now.   Some lower abd pain, intermittent dysuria/burning with urination.  No mucous, no blood in stools.  No fevers now.  Meds, vitals, and allergies reviewed.   ROS: Per HPI unless specifically indicated in ROS section   GEN: nad, alert and oriented HEENT: mucous membranes moist NECK: supple w/o LA CV: rrr. PULM: ctab, no inc wob ABD: soft, +bs, minimally tender to palpation in the lower abdomen bilaterally. EXT: no edema

## 2016-11-17 ENCOUNTER — Telehealth: Payer: Self-pay | Admitting: Family Medicine

## 2016-11-17 DIAGNOSIS — R1030 Lower abdominal pain, unspecified: Secondary | ICD-10-CM | POA: Insufficient documentation

## 2016-11-17 LAB — CBC WITH DIFFERENTIAL/PLATELET
BASOS PCT: 2 % (ref 0.0–3.0)
Basophils Absolute: 0.2 10*3/uL — ABNORMAL HIGH (ref 0.0–0.1)
EOS ABS: 0 10*3/uL (ref 0.0–0.7)
EOS PCT: 0.5 % (ref 0.0–5.0)
HEMATOCRIT: 42.7 % (ref 36.0–46.0)
HEMOGLOBIN: 14.3 g/dL (ref 12.0–15.0)
Lymphocytes Relative: 22.9 % (ref 12.0–46.0)
Lymphs Abs: 2 10*3/uL (ref 0.7–4.0)
MCHC: 33.5 g/dL (ref 30.0–36.0)
MCV: 93.2 fl (ref 78.0–100.0)
MONO ABS: 0.7 10*3/uL (ref 0.1–1.0)
Monocytes Relative: 8 % (ref 3.0–12.0)
Neutro Abs: 5.9 10*3/uL (ref 1.4–7.7)
Neutrophils Relative %: 66.6 % (ref 43.0–77.0)
Platelets: 371 10*3/uL (ref 150.0–400.0)
RBC: 4.58 Mil/uL (ref 3.87–5.11)
RDW: 13.4 % (ref 11.5–15.5)
WBC: 8.9 10*3/uL (ref 4.0–10.5)

## 2016-11-17 LAB — COMPREHENSIVE METABOLIC PANEL
ALBUMIN: 4.3 g/dL (ref 3.5–5.2)
ALK PHOS: 55 U/L (ref 39–117)
ALT: 9 U/L (ref 0–35)
AST: 17 U/L (ref 0–37)
BUN: 14 mg/dL (ref 6–23)
CALCIUM: 10 mg/dL (ref 8.4–10.5)
CO2: 30 mEq/L (ref 19–32)
CREATININE: 0.89 mg/dL (ref 0.40–1.20)
Chloride: 98 mEq/L (ref 96–112)
GFR: 65.8 mL/min (ref >60.00)
Glucose, Bld: 104 mg/dL — ABNORMAL HIGH (ref 70–99)
Potassium: 4.1 mEq/L (ref 3.5–5.1)
SODIUM: 136 meq/L (ref 135–145)
TOTAL PROTEIN: 7 g/dL (ref 6.0–8.3)
Total Bilirubin: 0.6 mg/dL (ref 0.2–1.2)

## 2016-11-17 LAB — URINE CULTURE: ORGANISM ID, BACTERIA: NO GROWTH

## 2016-11-17 NOTE — Assessment & Plan Note (Signed)
See discussion above. Okay to hold triamterene/hydrochlorothiazide for a few days. See notes on labs.

## 2016-11-17 NOTE — Telephone Encounter (Signed)
See lab results.  Thanks.  ucx still pending.

## 2016-11-17 NOTE — Assessment & Plan Note (Addendum)
She had flulike symptoms initially. She was treated with Tamiflu. She subsequently stopped having fevers and her cough got some better. Then she felt progressively worse and would emergency room. K was low, repleted. Given IV fluids. She still feels weak. She was having dark green diarrhea that has transition to dark green solid stools. Nontoxic and still okay for outpatient follow-up. Discussed with patient about options. It is unclear to me if all of this is related to the initial primary illness, if some of her symptoms are side effects from Tamiflu, or if she contracted the second illness. We will likely never know an exact answer. She understood that. At this point, reasonable to recheck basic labs. Check urine culture given lower abdominal pain. She has not tolerated multiple PO meds for nausea. Okay to try over-the-counter remedies for nausea. Discussed with patient. She agrees. I do think she is slowly improving, especially since the diarrhea has ceased. I would expect her stool color to eventually return normal. She will let us know if this does not happen. I think that she likely does still feel weak from the previous diarrhea. This should get better. >25 minutes spent in face to face time with patient, >50% spent in counselling or coordination of care.

## 2016-11-17 NOTE — Telephone Encounter (Signed)
Patient called asking to be called when lab results come in.

## 2016-11-17 NOTE — Telephone Encounter (Signed)
Patient notified as instructed by telephone and verbalized understanding. 

## 2016-11-23 DIAGNOSIS — Z85828 Personal history of other malignant neoplasm of skin: Secondary | ICD-10-CM | POA: Diagnosis not present

## 2016-11-23 DIAGNOSIS — L905 Scar conditions and fibrosis of skin: Secondary | ICD-10-CM | POA: Diagnosis not present

## 2016-11-29 ENCOUNTER — Encounter: Payer: Self-pay | Admitting: Family Medicine

## 2016-11-29 ENCOUNTER — Telehealth: Payer: Self-pay | Admitting: Family Medicine

## 2016-11-29 ENCOUNTER — Ambulatory Visit (INDEPENDENT_AMBULATORY_CARE_PROVIDER_SITE_OTHER): Payer: Medicare Other | Admitting: Family Medicine

## 2016-11-29 DIAGNOSIS — R110 Nausea: Secondary | ICD-10-CM

## 2016-11-29 NOTE — Telephone Encounter (Signed)
Patient called to schedule an appointment with Dr.Duncan.  Patient has been following up with Dr.Duncan since she was diagnosed with the flu.  Patient said he has helped her a lot, but she's still nauseated.  Patient wants to know if she can switch from Dr.Aron to [place].  Patient scheduled appointment with Dr.Duncan today at 4:00. Can patient switch?

## 2016-11-29 NOTE — Assessment & Plan Note (Signed)
Continue probiotic. I don't think she has gallbladder pathology. Right upper quadrant is not tender palpation. Discussed with patient. I think it is more likely to be a holdover from the previous acute illness, where she may have altered GI tract flora. This should resolve. Her symptoms are getting less pronounced each day. Continue as is. Update me as needed. Not jaundiced. No emergent symptoms.

## 2016-11-29 NOTE — Progress Notes (Signed)
She looks better today, this was the first thing I noted when I walked in the exam room.  Previously seen with flulike symptoms then GI symptoms. She skipped her BP meds for a few days.  She was still nauseated.  Then her BP went back up so she restarted her BP meds.    She'll have a spell of nausea each AM.  No vomiting.  It will resolve and she'll feel better for the rest of the day.  She'll have this episode for about 30 min every day.  She is fine for about 23.5 hours per day otherwise.  The episodes aren't always after a meal.  The episodes are not in the afternoon.  She has daily episodes.  No help with pepcid.    No diarrhea.  Stools are back to normal.  These episodes only started since the last illness.  The episodes are getting less bad each day.  No jaundice or bleeding.  The episodes can happen before or after breakfast- it seems that meals don't make a difference.  She is still on a probiotic.    Meds, vitals, and allergies reviewed.   ROS: Per HPI unless specifically indicated in ROS section   GEN: nad, alert and oriented HEENT: mucous membranes moist NECK: supple w/o LA CV: rrr. PULM: ctab, no inc wob ABD: soft, +bs EXT: no edema

## 2016-11-29 NOTE — Telephone Encounter (Signed)
[place] with me.  She is a very nice lady.

## 2016-11-29 NOTE — Telephone Encounter (Signed)
Okay with me. Thanks 

## 2016-11-29 NOTE — Patient Instructions (Signed)
Keep taking the probiotic and this should gradually resolve.   Take care.  Glad to see you.  Update me as needed.

## 2016-11-29 NOTE — Progress Notes (Signed)
Pre visit review using our clinic review tool, if applicable. No additional management support is needed unless otherwise documented below in the visit note. 

## 2016-11-29 NOTE — Telephone Encounter (Signed)
I spoke to patient and let her know she can switch to [place].

## 2017-02-28 ENCOUNTER — Other Ambulatory Visit: Payer: Self-pay | Admitting: Family Medicine

## 2017-02-28 DIAGNOSIS — L578 Other skin changes due to chronic exposure to nonionizing radiation: Secondary | ICD-10-CM | POA: Diagnosis not present

## 2017-02-28 DIAGNOSIS — L820 Inflamed seborrheic keratosis: Secondary | ICD-10-CM | POA: Diagnosis not present

## 2017-02-28 DIAGNOSIS — Z85828 Personal history of other malignant neoplasm of skin: Secondary | ICD-10-CM | POA: Diagnosis not present

## 2017-02-28 NOTE — Telephone Encounter (Signed)
Last refill 06/07/16 Last OV 11/29/16 Ok to refill?

## 2017-03-01 ENCOUNTER — Telehealth: Payer: Self-pay | Admitting: Family Medicine

## 2017-03-01 NOTE — Telephone Encounter (Signed)
Please schedule CPE as instructed. 

## 2017-03-01 NOTE — Telephone Encounter (Signed)
See 03/01/17 phone note (AWV)

## 2017-03-01 NOTE — Telephone Encounter (Signed)
Left pt message asking to call Ebony Hail back directly at [phone] to schedule AWV + labs with Katha Cabal and CPE with PCP.  *NOTE* Last AWV 05/20/16

## 2017-03-01 NOTE — Telephone Encounter (Signed)
Scheduled 05/25/17

## 2017-03-01 NOTE — Telephone Encounter (Signed)
Agreed, due for CPE.  Thanks.  Sent.

## 2017-04-05 DIAGNOSIS — L910 Hypertrophic scar: Secondary | ICD-10-CM | POA: Diagnosis not present

## 2017-04-05 DIAGNOSIS — L578 Other skin changes due to chronic exposure to nonionizing radiation: Secondary | ICD-10-CM | POA: Diagnosis not present

## 2017-05-11 DIAGNOSIS — H402213 Chronic angle-closure glaucoma, right eye, severe stage: Secondary | ICD-10-CM | POA: Diagnosis not present

## 2017-05-11 DIAGNOSIS — H1013 Acute atopic conjunctivitis, bilateral: Secondary | ICD-10-CM | POA: Diagnosis not present

## 2017-05-11 DIAGNOSIS — H5712 Ocular pain, left eye: Secondary | ICD-10-CM | POA: Diagnosis not present

## 2017-05-11 DIAGNOSIS — H402221 Chronic angle-closure glaucoma, left eye, mild stage: Secondary | ICD-10-CM | POA: Diagnosis not present

## 2017-05-25 ENCOUNTER — Other Ambulatory Visit: Payer: Self-pay | Admitting: Family Medicine

## 2017-05-25 ENCOUNTER — Ambulatory Visit (INDEPENDENT_AMBULATORY_CARE_PROVIDER_SITE_OTHER): Payer: Medicare Other

## 2017-05-25 VITALS — BP 130/68 | HR 59 | Temp 97.4°F | Ht 63.0 in | Wt 146.2 lb

## 2017-05-25 DIAGNOSIS — Z0000 Encounter for general adult medical examination without abnormal findings: Secondary | ICD-10-CM

## 2017-05-25 DIAGNOSIS — I10 Essential (primary) hypertension: Secondary | ICD-10-CM

## 2017-05-25 LAB — COMPREHENSIVE METABOLIC PANEL
ALBUMIN: 4.4 g/dL (ref 3.5–5.2)
ALK PHOS: 56 U/L (ref 39–117)
ALT: 7 U/L (ref 0–35)
AST: 14 U/L (ref 0–37)
BILIRUBIN TOTAL: 0.6 mg/dL (ref 0.2–1.2)
BUN: 14 mg/dL (ref 6–23)
CALCIUM: 9.8 mg/dL (ref 8.4–10.5)
CO2: 33 meq/L — AB (ref 19–32)
CREATININE: 0.81 mg/dL (ref 0.40–1.20)
Chloride: 103 mEq/L (ref 96–112)
GFR: 73.25 mL/min (ref >60.00)
Glucose, Bld: 110 mg/dL — ABNORMAL HIGH (ref 70–99)
Potassium: 4.5 mEq/L (ref 3.5–5.1)
Sodium: 143 mEq/L (ref 135–145)
TOTAL PROTEIN: 7.1 g/dL (ref 6.0–8.3)

## 2017-05-25 LAB — LIPID PANEL
CHOLESTEROL: 211 mg/dL — AB (ref 0–200)
HDL: 66.4 mg/dL (ref >39.00)
LDL Cholesterol: 129 mg/dL — ABNORMAL HIGH (ref 0–99)
NonHDL: 144.77
TRIGLYCERIDES: 81 mg/dL (ref 0.0–149.0)
Total CHOL/HDL Ratio: 3
VLDL: 16.2 mg/dL (ref 0.0–40.0)

## 2017-05-25 NOTE — Progress Notes (Signed)
Pre visit review using our clinic review tool, if applicable. No additional management support is needed unless otherwise documented below in the visit note. 

## 2017-05-25 NOTE — Progress Notes (Signed)
I reviewed health advisor's note, was available for consultation, and agree with documentation and plan.   Signed,  Uri Covey T. Ranyah Groeneveld, MD  

## 2017-05-25 NOTE — Progress Notes (Signed)
PCP notes:   Health maintenance:  Flu vaccine - pt requests to have vaccine at CPE Mammogram - scheduled Nov 2018  Abnormal screenings:   None  Patient concerns:   None  Nurse concerns:  None  Next PCP appt:   05/27/17 @ 0945

## 2017-05-25 NOTE — Progress Notes (Signed)
Subjective:   Amy Barron is a 76 y.o. female who presents for Medicare Annual (Subsequent) preventive examination.  Review of Systems:  N/A Cardiac Risk Factors include: advanced age (>4men, >23 women);dyslipidemia;hypertension     Objective:     Vitals: BP 130/68 (BP Location: Right Arm, Patient Position: Sitting, Cuff Size: Normal)   Pulse (!) 59   Temp (!) 97.4 F (36.3 C) (Oral)   Ht 5\' 3"  (1.6 m) Comment: no shoes  Wt 146 lb 4 oz (66.3 kg)   SpO2 99%   BMI 25.91 kg/m   Body mass index is 25.91 kg/m.   Tobacco History  Smoking Status  . Never Smoker  Smokeless Tobacco  . Never Used     Counseling given: No   Past Medical History:  Diagnosis Date  . History of renal stone   . Hyperlipidemia   . Hypertension    Past Surgical History:  Procedure Laterality Date  . ABDOMINAL HYSTERECTOMY  1998  . COLONOSCOPY W/ BIOPSIES AND POLYPECTOMY    . DILATION AND CURETTAGE OF UTERUS    . MULTIPLE TOOTH EXTRACTIONS    . ORIF HUMERUS FRACTURE Left 07/11/2015   Procedure: OPEN REDUCTION INTERNAL FIXATION (ORIF) PROXIMAL HUMERUS FRACTURE;  Surgeon: Marybelle Killings, MD;  Location: [facility];  Service: Orthopedics;  Laterality: Left;  . ORIF WRIST FRACTURE  01/18/2012   Procedure: OPEN REDUCTION INTERNAL FIXATION (ORIF) WRIST FRACTURE;  Surgeon: Marybelle Killings, MD;  Location: [place];  Service: Orthopedics;  Laterality: Left;  . PARATHYROIDECTOMY    . TUBAL LIGATION     Family History  Problem Relation Age of Onset  . Cancer Father   . Colon cancer Father 23  . Diabetes Other    History  Sexual Activity  . Sexual activity: No    Outpatient Encounter Prescriptions as of 05/25/2017  Medication Sig  . amLODipine (NORVASC) 5 MG tablet TAKE 1 TABLET BY MOUTH ONCE DAILY  . aspirin EC 81 MG tablet Take 81 mg by mouth daily.  Marland Kitchen etodolac (LODINE) 400 MG tablet TAKE 1 TABLET (400 MG TOTAL) BY MOUTH DAILY.  . Multiple Vitamin (MULTIVITAMIN) tablet Take 1 tablet by mouth daily.    . Red Yeast Rice 600 MG TABS Take 1 tablet by mouth daily.   Marland Kitchen triamterene-hydrochlorothiazide (MAXZIDE-25) 37.5-25 MG tablet TAKE 1 TABLET BY MOUTH DAILY.   No facility-administered encounter medications on file as of 05/25/2017.     Activities of Daily Living In your present state of health, do you have any difficulty performing the following activities: 05/25/2017  Hearing? N  Vision? N  Difficulty concentrating or making decisions? N  Walking or climbing stairs? N  Dressing or bathing? N  Doing errands, shopping? N  Preparing Food and eating ? N  Using the Toilet? N  In the past six months, have you accidently leaked urine? N  Do you have problems with loss of bowel control? N  Managing your Medications? N  Managing your Finances? N  Housekeeping or managing your Housekeeping? N  Some recent data might be hidden    Patient Care Team: Tonia Ghent, MD as PCP - General (Family Medicine)    Assessment:     Hearing Screening   125Hz  250Hz  500Hz  1000Hz  2000Hz  3000Hz  4000Hz  6000Hz  8000Hz   Right ear:   40 40 40  40    Left ear:   40 40 40  40    Vision Screening Comments: Last vision exam in Oct 2018  with Dr. Herbert Deaner   Exercise Activities and Dietary recommendations Current Exercise Habits: Structured exercise class, Type of exercise: yoga, Time (Minutes): 45, Frequency (Times/Week): 3, Weekly Exercise (Minutes/Week): 135, Intensity: Moderate, Exercise limited by: None identified  Goals    . Increase physical activity          Starting 05/25/2017, I will continue to exercise for at least 45 min 3 days per week.       Fall Risk Fall Risk  05/25/2017 05/20/2016 04/08/2015  Falls in the past year? No No Yes  Number falls in past yr: - - 1  Injury with Fall? - - Yes  Comment - - broken tail bone   Depression Screen PHQ 2/9 Scores 05/25/2017 05/20/2016 04/08/2015  PHQ - 2 Score 0 0 0  PHQ- 9 Score 0 - -     Cognitive Function MMSE - Mini Mental State Exam  05/25/2017  Orientation to time 5  Orientation to Place 5  Registration 3  Attention/ Calculation 0  Recall 3  Language- name 2 objects 0  Language- repeat 1  Language- follow 3 step command 3  Language- read & follow direction 0  Write a sentence 0  Copy design 0  Total score 20     PLEASE NOTE: A Mini-Cog screen was completed. Maximum score is 20. A value of 0 denotes this part of [place] MMSE was not completed or the patient failed this part of the Mini-Cog screening.   Mini-Cog Screening Orientation to Time - Max 5 pts Orientation to Place - Max 5 pts Registration - Max 3 pts Recall - Max 3 pts Language Repeat - Max 1 pts Language Follow 3 Step Command - Max 3 pts     Immunization History  Administered Date(s) Administered  . Influenza Split 04/26/2012  . Influenza,inj,Quad PF,6+ Mos 05/08/2013  . Pneumococcal Conjugate-13 04/12/2014  . Pneumococcal Polysaccharide-23 04/20/2011   Screening Tests Health Maintenance  Topic Date Due  . MAMMOGRAM  06/06/2017 (Originally 05/21/2017)  . INFLUENZA VACCINE  10/23/2017 (Originally 02/23/2017)  . COLONOSCOPY  09/19/2017  . TETANUS/TDAP  04/20/2019  . DEXA SCAN  Completed  . PNA vac Low Risk Adult  Completed      Plan:     I have personally reviewed, addressed, and noted the following in the patient's chart:  A. Medical and social history B. Use of alcohol, tobacco or illicit drugs  C. Current medications and supplements D. Functional ability and status E.  Nutritional status F.  Physical activity G. Advance directives H. List of other physicians I.  Hospitalizations, surgeries, and ER visits in previous 12 months J.  [place] to include hearing, vision, cognitive, depression L. Referrals and appointments - none  In addition, I have reviewed and discussed with patient certain preventive protocols, quality metrics, and best practice recommendations. A written personalized care plan for preventive  services as well as general preventive health recommendations were provided to patient.  See attached scanned questionnaire for additional information.   Signed,   Lindell Noe, MHA, BS, LPN Health Coach

## 2017-05-25 NOTE — Patient Instructions (Signed)
[name] , Thank you for taking time to come for your Medicare Wellness Visit. I appreciate your ongoing commitment to your health goals. Please review the following plan we discussed and let me know if I can assist you in the future.   These are the goals we discussed: Goals    . Increase physical activity          Starting 05/25/2017, I will continue to exercise for at least 45 min 3 days per week.        This is a list of the screening recommended for you and due dates:  Health Maintenance  Topic Date Due  . Mammogram  06/06/2017*  . Flu Shot  10/23/2017*  . Colon Cancer Screening  09/19/2017  . Tetanus Vaccine  04/20/2019  . DEXA scan (bone density measurement)  Completed  . Pneumonia vaccines  Completed  *Topic was postponed. The date shown is not the original due date.   Preventive Care for Adults  A healthy lifestyle and preventive care can promote health and wellness. Preventive health guidelines for adults include the following key practices.  . A routine yearly physical is a good way to check with your health care provider about your health and preventive screening. It is a chance to share any concerns and updates on your health and to receive a thorough exam.  . Visit your dentist for a routine exam and preventive care every 6 months. Brush your teeth twice a day and floss once a day. Good oral hygiene prevents tooth decay and gum disease.  . The frequency of eye exams is based on your age, health, family medical history, use  of contact lenses, and other factors. Follow your health care provider's recommendations for frequency of eye exams.  . Eat a healthy diet. Foods like vegetables, fruits, whole grains, low-fat dairy products, and lean protein foods contain the nutrients you need without too many calories. Decrease your intake of foods high in solid fats, added sugars, and salt. Eat the right amount of calories for you. Get information about a proper diet from  your health care provider, if necessary.  . Regular physical exercise is one of the most important things you can do for your health. Most adults should get at least 150 minutes of moderate-intensity exercise (any activity that increases your heart rate and causes you to sweat) each week. In addition, most adults need muscle-strengthening exercises on 2 or more days a week.  Silver Sneakers may be a benefit available to you. To determine eligibility, you may visit the website: www.silversneakers.com or contact program at 7542245330 Mon-Fri between 8AM-8PM.   . Maintain a healthy weight. The body mass index (BMI) is a screening tool to identify possible weight problems. It provides an estimate of body fat based on height and weight. Your health care provider can find your BMI and can help you achieve or maintain a healthy weight.   For adults 20 years and older: ? A BMI below 18.5 is considered underweight. ? A BMI of 18.5 to 24.9 is normal. ? A BMI of 25 to 29.9 is considered overweight. ? A BMI of 30 and above is considered obese.   . Maintain normal blood lipids and cholesterol levels by exercising and minimizing your intake of saturated fat. Eat a balanced diet with plenty of fruit and vegetables. Blood tests for lipids and cholesterol should begin at age 21 and be repeated every 5 years. If your lipid or cholesterol levels are high,  you are over 50, or you are at high risk for heart disease, you may need your cholesterol levels checked more frequently. Ongoing high lipid and cholesterol levels should be treated with medicines if diet and exercise are not working.  . If you smoke, find out from your health care provider how to quit. If you do not use tobacco, please do not start.  . If you choose to drink alcohol, please do not consume more than 2 drinks per day. One drink is considered to be 12 ounces (355 mL) of beer, 5 ounces (148 mL) of wine, or 1.5 ounces (44 mL) of liquor.  . If you  are 39-34 years old, ask your health care provider if you should take aspirin to prevent strokes.  . Use sunscreen. Apply sunscreen liberally and repeatedly throughout the day. You should seek shade when your shadow is shorter than you. Protect yourself by wearing long sleeves, pants, a wide-brimmed hat, and sunglasses year round, whenever you are outdoors.  . Once a month, do a whole body skin exam, using a mirror to look at the skin on your back. Tell your health care provider of new moles, moles that have irregular borders, moles that are larger than a pencil eraser, or moles that have changed in shape or color.

## 2017-05-27 ENCOUNTER — Encounter: Payer: Self-pay | Admitting: Family Medicine

## 2017-05-27 ENCOUNTER — Other Ambulatory Visit: Payer: Self-pay | Admitting: Family Medicine

## 2017-05-27 ENCOUNTER — Ambulatory Visit (INDEPENDENT_AMBULATORY_CARE_PROVIDER_SITE_OTHER): Payer: Medicare Other | Admitting: Family Medicine

## 2017-05-27 VITALS — BP 130/68 | HR 59 | Temp 97.4°F | Ht 63.0 in | Wt 146.2 lb

## 2017-05-27 DIAGNOSIS — I10 Essential (primary) hypertension: Secondary | ICD-10-CM | POA: Diagnosis not present

## 2017-05-27 DIAGNOSIS — Z23 Encounter for immunization: Secondary | ICD-10-CM

## 2017-05-27 DIAGNOSIS — Z0000 Encounter for general adult medical examination without abnormal findings: Secondary | ICD-10-CM

## 2017-05-27 DIAGNOSIS — E785 Hyperlipidemia, unspecified: Secondary | ICD-10-CM

## 2017-05-27 DIAGNOSIS — M2550 Pain in unspecified joint: Secondary | ICD-10-CM | POA: Diagnosis not present

## 2017-05-27 DIAGNOSIS — Z7189 Other specified counseling: Secondary | ICD-10-CM

## 2017-05-27 MED ORDER — TRIAMTERENE-HCTZ 37.5-25 MG PO TABS: 1.0000 | ORAL_TABLET | Freq: Every day | ORAL | 3 refills | Status: DC

## 2017-05-27 MED ORDER — AMLODIPINE BESYLATE 5 MG PO TABS: 5.0000 mg | ORAL_TABLET | Freq: Every day | ORAL | 3 refills | Status: DC

## 2017-05-27 MED ORDER — ETODOLAC 400 MG PO TABS: ORAL_TABLET | ORAL | 3 refills | Status: DC

## 2017-05-27 NOTE — Patient Instructions (Addendum)
Check with your insurance to see if they will cover the shingrix shot. Don't change your meds for now.  Keep exercising.  Take care.  Glad to see you.  Update me as needed.

## 2017-05-27 NOTE — Progress Notes (Signed)
Hypertension:    Using medication without problems or lightheadedness: yes Chest pain with exertion:no Edema:no Short of breath:no  Elevated Cholesterol: Using medications without problems:yes, taking RYR Muscle aches: no Diet compliance: yes Exercise:yes Labs d/w pt.   Using lodine as needed for joint aches.  No ADE on med.  She has more aches when she stops/skips the med.  She takes nsaid with food.    shingles d/w pt. See AVS.  Colonoscopy 2014.  D/w pt.  Not due for f/u at this point.   DXA 2018.  Mammogram due this month, d/w pt, pending.   Living will d/w pt.  Brother Baird Kay designated if patient were incapacitated.  Diet and exercise.  Doing yoga 3 times a week.  Working in the yard.  Diet is healthy.  Weight is stable.   Mood is good.  D/w pt.  Safe at home.  Lives alone.    Meds, vitals, and allergies reviewed.   PMH and SH reviewed  ROS: Per HPI unless specifically indicated in ROS section   GEN: nad, alert and oriented HEENT: mucous membranes moist NECK: supple w/o LA CV: rrr. PULM: ctab, no inc wob ABD: soft, +bs EXT: no edema SKIN: no acute rash

## 2017-05-29 DIAGNOSIS — Z0000 Encounter for general adult medical examination without abnormal findings: Secondary | ICD-10-CM | POA: Insufficient documentation

## 2017-05-29 DIAGNOSIS — M2550 Pain in unspecified joint: Secondary | ICD-10-CM | POA: Insufficient documentation

## 2017-05-29 DIAGNOSIS — Z7189 Other specified counseling: Secondary | ICD-10-CM | POA: Insufficient documentation

## 2017-05-29 NOTE — Assessment & Plan Note (Addendum)
controlled. Labs discussed with patient. No change in meds. She agrees.  >25 minutes spent in face to face time with patient, >50% spent in counselling or coordination of care, re: HTN, HLD, etc.

## 2017-05-29 NOTE — Assessment & Plan Note (Signed)
Living will d/w pt.  Brother Baird Kay designated if patient were incapacitated.

## 2017-05-29 NOTE — Assessment & Plan Note (Signed)
Able to tolerate red yeast rice. Continue as is. Labs discussed with patient. Discussed with about diet and exercise, as tolerated.

## 2017-05-29 NOTE — Assessment & Plan Note (Signed)
Using lodine as needed for joint aches.  No ADE on med.  She has more aches when she stops/skips the med.  She takes nsaid with food.  Cautions d/w pt.

## 2017-05-29 NOTE — Assessment & Plan Note (Signed)
shingles d/w pt. See AVS.  Colonoscopy 2014.  D/w pt.  Not due for f/u at this point.   DXA 2018.  Mammogram due this month, d/w pt, pending.   Living will d/w pt.  Brother Baird Kay designated if patient were incapacitated.  Diet and exercise.  Doing yoga 3 times a week.  Working in the yard.  Diet is healthy.  Weight is stable.   Mood is good.  D/w pt.  Safe at home.  Lives alone.

## 2017-06-01 ENCOUNTER — Ambulatory Visit (INDEPENDENT_AMBULATORY_CARE_PROVIDER_SITE_OTHER): Payer: Medicare Other | Admitting: Internal Medicine

## 2017-06-01 ENCOUNTER — Encounter: Payer: Self-pay | Admitting: Internal Medicine

## 2017-06-01 VITALS — BP 124/70 | HR 74 | Temp 98.2°F | Wt 149.0 lb

## 2017-06-01 DIAGNOSIS — H9202 Otalgia, left ear: Secondary | ICD-10-CM | POA: Diagnosis not present

## 2017-06-01 MED ORDER — NEOMYCIN-POLYMYXIN-HC 3.5-10000-1 OT SUSP: 4.0000 [drp] | Freq: Four times a day (QID) | OTIC | 0 refills | Status: DC

## 2017-06-01 NOTE — Patient Instructions (Signed)
Please continue the sweet oil and take tylenol if any significant pain. If your ear pain worsens, fill the prescription for the drops.

## 2017-06-01 NOTE — Assessment & Plan Note (Signed)
No findings in middle ear  No swollen glands, etc Discussed--no indication for antibiotic Tragal tenderness but no sig canal inflammation Will try the sweet oil and tylenol Cortisporin if worsens

## 2017-06-01 NOTE — Progress Notes (Signed)
Subjective:    Patient ID: Amy Barron, female    DOB: Feb 03, 1941, 76 y.o.   MRN: [id number]  HPI Here due to ear pain--left  Started 2 days ago Planning to go out of time Notices it under left jaw line Trouble with cerumen in past---pain better after cleaning  Careful about cleaning after shower No swimming  No fever No recent cold symptoms  Tired sweet oil---soothing but didn't resolve  Current Outpatient Medications on File Prior to Visit  Medication Sig Dispense Refill  . amLODipine (NORVASC) 5 MG tablet Take 1 tablet (5 mg total) by mouth daily. 90 tablet 3  . aspirin EC 81 MG tablet Take 81 mg by mouth daily.    Marland Kitchen etodolac (LODINE) 400 MG tablet TAKE 1 TABLET (400 MG TOTAL) BY MOUTH DAILY WITH FOOD. 90 tablet 3  . Multiple Vitamin (MULTIVITAMIN) tablet Take 1 tablet by mouth daily.    . Red Yeast Rice 600 MG TABS Take 1 tablet by mouth daily.     Marland Kitchen triamterene-hydrochlorothiazide (MAXZIDE-25) 37.5-25 MG tablet Take 1 tablet by mouth daily. 90 tablet 3   No current facility-administered medications on file prior to visit.     Allergies  Allergen Reactions  . Phenergan [Promethazine Hcl] Other (See Comments)    Lack of effect, not an allergy  . Zofran [Ondansetron Hcl]     Lack of effect, not an allergy    Past Medical History:  Diagnosis Date  . History of renal stone   . Hyperlipidemia   . Hypertension     Past Surgical History:  Procedure Laterality Date  . ABDOMINAL HYSTERECTOMY  1998  . COLONOSCOPY W/ BIOPSIES AND POLYPECTOMY    . DILATION AND CURETTAGE OF UTERUS    . MULTIPLE TOOTH EXTRACTIONS    . PARATHYROIDECTOMY    . TUBAL LIGATION      Family History  Problem Relation Age of Onset  . Cancer Father   . Colon cancer Father 2  . Diabetes Other   . Breast cancer Maternal Aunt     Social History   Socioeconomic History  . Marital status: Married    Spouse name: Not on file  . Number of children: Not on file  . Years of education:  Not on file  . Highest education level: Not on file  Social Needs  . Financial resource strain: Not on file  . Food insecurity - worry: Not on file  . Food insecurity - inability: Not on file  . Transportation needs - medical: Not on file  . Transportation needs - non-medical: Not on file  Occupational History  . Not on file  Tobacco Use  . Smoking status: Never Smoker  . Smokeless tobacco: Never Used  Substance and Sexual Activity  . Alcohol use: No  . Drug use: No  . Sexual activity: No  Other Topics Concern  . Not on file  Social History Narrative   Desires CPR.   Would not want prolonged life support if futile   She does have a Living Will.   Review of Systems No sore throat No headache No GI problems No trouble with teeth    Objective:   Physical Exam  Constitutional: No distress.  HENT:  No sinus tenderness No oral lesions or tooth abnormalities Slight left tragal tenderness. Canal looks okay. TM normal. Normal on right  Neck: No thyromegaly present.  Lymphadenopathy:       Head (right side): No submental, no submandibular, no tonsillar,  no preauricular, no posterior auricular and no occipital adenopathy present.       Head (left side): No submental, no submandibular, no tonsillar, no preauricular and no occipital adenopathy present.    She has no cervical adenopathy.          Assessment & Plan:

## 2017-06-13 DIAGNOSIS — Z1231 Encounter for screening mammogram for malignant neoplasm of breast: Secondary | ICD-10-CM | POA: Diagnosis not present

## 2017-06-14 ENCOUNTER — Encounter: Payer: Self-pay | Admitting: Family Medicine

## 2017-08-17 ENCOUNTER — Telehealth: Payer: Self-pay | Admitting: Family Medicine

## 2017-08-17 NOTE — Telephone Encounter (Signed)
Please call pt.   1. congrats and I wish her and her spouse only the best.  2. It is probably worth OV to talk about options and risk/benefit with med.  Likely low risk med and may be beneficial.  Worth the OV to have the conversation and talk about it.  Please schedule.   Thanks.

## 2017-08-17 NOTE — Telephone Encounter (Signed)
Copied from [place] [id number]. Topic: General - Other >> Aug 17, 2017 10:51 AM Darl Householder, RMA wrote: Reason for CRM: patient is requesting a call back from Dr. Damita Dunnings or [place] concerning personal matter

## 2017-08-17 NOTE — Telephone Encounter (Signed)
Left message on voicemail for patient to call back. 

## 2017-08-17 NOTE — Telephone Encounter (Signed)
Spoke to patient and was advised that she is getting married. Patient stated that she has not been sexually active for 10 years and some of her friends have told her that she should get some Premarin cream. Patent stated that she will be glad to schedule an appointment to come in and discuss this if Dr. Damita Dunnings wants her to. Patient stated that she does have a history of yeast infections when she was previously married and she does not want to start back getting them.

## 2017-08-18 NOTE — Telephone Encounter (Signed)
Appointment is scheduled.

## 2017-08-23 ENCOUNTER — Encounter: Payer: Self-pay | Admitting: Family Medicine

## 2017-08-23 ENCOUNTER — Ambulatory Visit (INDEPENDENT_AMBULATORY_CARE_PROVIDER_SITE_OTHER): Payer: Medicare Other | Admitting: Family Medicine

## 2017-08-23 DIAGNOSIS — Z780 Asymptomatic menopausal state: Secondary | ICD-10-CM | POA: Diagnosis not present

## 2017-08-23 MED ORDER — ESTROGENS, CONJUGATED 0.625 MG/GM VA CREA: 1.0000 | TOPICAL_CREAM | VAGINAL | 3 refills | Status: DC

## 2017-08-23 NOTE — Progress Notes (Signed)
S/p hysterectomy for bleeding.  No cancer hx.  No h/o DVT. No VB.  Mammogram up to date.   Getting married 09/11/17.  She happy about the changes.  Safe at home.  She has vaginal dryness and was asking about options.  She was worried about pain with intercourse.  She had history of urinary tract infections after intercourse back when she was married.  Meds, vitals, and allergies reviewed.   ROS: Per HPI unless specifically indicated in ROS section   nad ncat Vaginal exam deferred.  rrr ctab

## 2017-08-23 NOTE — Patient Instructions (Signed)
Try premarin cream daily for about 2 weeks, then twice a week.  Use KY gel as needed.   Urinate after intercourse.  Update me as needed.  Glad to see you.

## 2017-08-24 ENCOUNTER — Telehealth: Payer: Self-pay | Admitting: Family Medicine

## 2017-08-24 DIAGNOSIS — Z780 Asymptomatic menopausal state: Secondary | ICD-10-CM | POA: Insufficient documentation

## 2017-08-24 MED ORDER — ESTRADIOL 10 MCG VA TABS: ORAL_TABLET | VAGINAL | 0 refills | Status: DC

## 2017-08-24 NOTE — Telephone Encounter (Signed)
Copied from [place] [id number]. Topic: Quick Communication - See Telephone Encounter >> Aug 24, 2017 11:05 AM Boyd Kerbs wrote: CRM for notification. See Telephone encounter for:   Pt. Said the medicine that doctor prescription Premerium costs over $300.00 and conjuated cream is $362.00,    Is there something else can be prescribed less expensive. Or could check to get PA and if so how much.   Can leave message. She is getting married and is needing this  [id number] [place], [address] [place] [place] [address] Phone: [phone] Fax: [phone]    08/24/17.

## 2017-08-24 NOTE — Assessment & Plan Note (Signed)
Discussed with patient about routine postmenopausal changes.  We talked about vaginal atrophy and potential pain with intercourse.  We talked about options.  She was interested in starting vaginal estrogen.  We talked about oral estrogen versus vaginal estrogen and the risks associated with them.  It is likely the case that vaginal estrogen cream is a medication with lower risk.  We talked about voiding after urination and also using lubrication.  Cautions given.  All questions answered.  See orders.  Update me as needed.  She agrees.

## 2017-08-24 NOTE — Telephone Encounter (Signed)
Patient notified as instructed by telephone and verbalized understanding. 

## 2017-08-24 NOTE — Telephone Encounter (Signed)
Worked on this already this AM.  Changed rx to tabs.  Should be cheaper.  If that isn't a lot cheaper then let me know.  And the tabs are vaginal- not oral.  Same plan, daily for 2 weeks, then twice a week.  Thanks.

## 2017-10-01 ENCOUNTER — Encounter: Payer: Self-pay | Admitting: Internal Medicine

## 2017-10-27 ENCOUNTER — Encounter: Payer: Self-pay | Admitting: Family Medicine

## 2017-10-27 DIAGNOSIS — H402213 Chronic angle-closure glaucoma, right eye, severe stage: Secondary | ICD-10-CM | POA: Diagnosis not present

## 2017-10-27 DIAGNOSIS — H25013 Cortical age-related cataract, bilateral: Secondary | ICD-10-CM | POA: Diagnosis not present

## 2017-10-27 DIAGNOSIS — H402221 Chronic angle-closure glaucoma, left eye, mild stage: Secondary | ICD-10-CM | POA: Diagnosis not present

## 2017-10-27 DIAGNOSIS — H2513 Age-related nuclear cataract, bilateral: Secondary | ICD-10-CM | POA: Diagnosis not present

## 2017-10-27 DIAGNOSIS — H2512 Age-related nuclear cataract, left eye: Secondary | ICD-10-CM | POA: Diagnosis not present

## 2017-11-29 DIAGNOSIS — H25812 Combined forms of age-related cataract, left eye: Secondary | ICD-10-CM | POA: Diagnosis not present

## 2017-11-29 DIAGNOSIS — H2512 Age-related nuclear cataract, left eye: Secondary | ICD-10-CM | POA: Diagnosis not present

## 2017-12-21 DIAGNOSIS — H2511 Age-related nuclear cataract, right eye: Secondary | ICD-10-CM | POA: Diagnosis not present

## 2017-12-21 DIAGNOSIS — H25011 Cortical age-related cataract, right eye: Secondary | ICD-10-CM | POA: Diagnosis not present

## 2017-12-27 DIAGNOSIS — H2511 Age-related nuclear cataract, right eye: Secondary | ICD-10-CM | POA: Diagnosis not present

## 2017-12-27 DIAGNOSIS — H25811 Combined forms of age-related cataract, right eye: Secondary | ICD-10-CM | POA: Diagnosis not present

## 2018-03-11 IMAGING — CR DG CHEST 2V
2 series · 2 of 2 positions shown · non-contrast
Comparison: 07/30/2015

CLINICAL DATA: Cough

EXAM:
CHEST  2 VIEW

[chest lat]
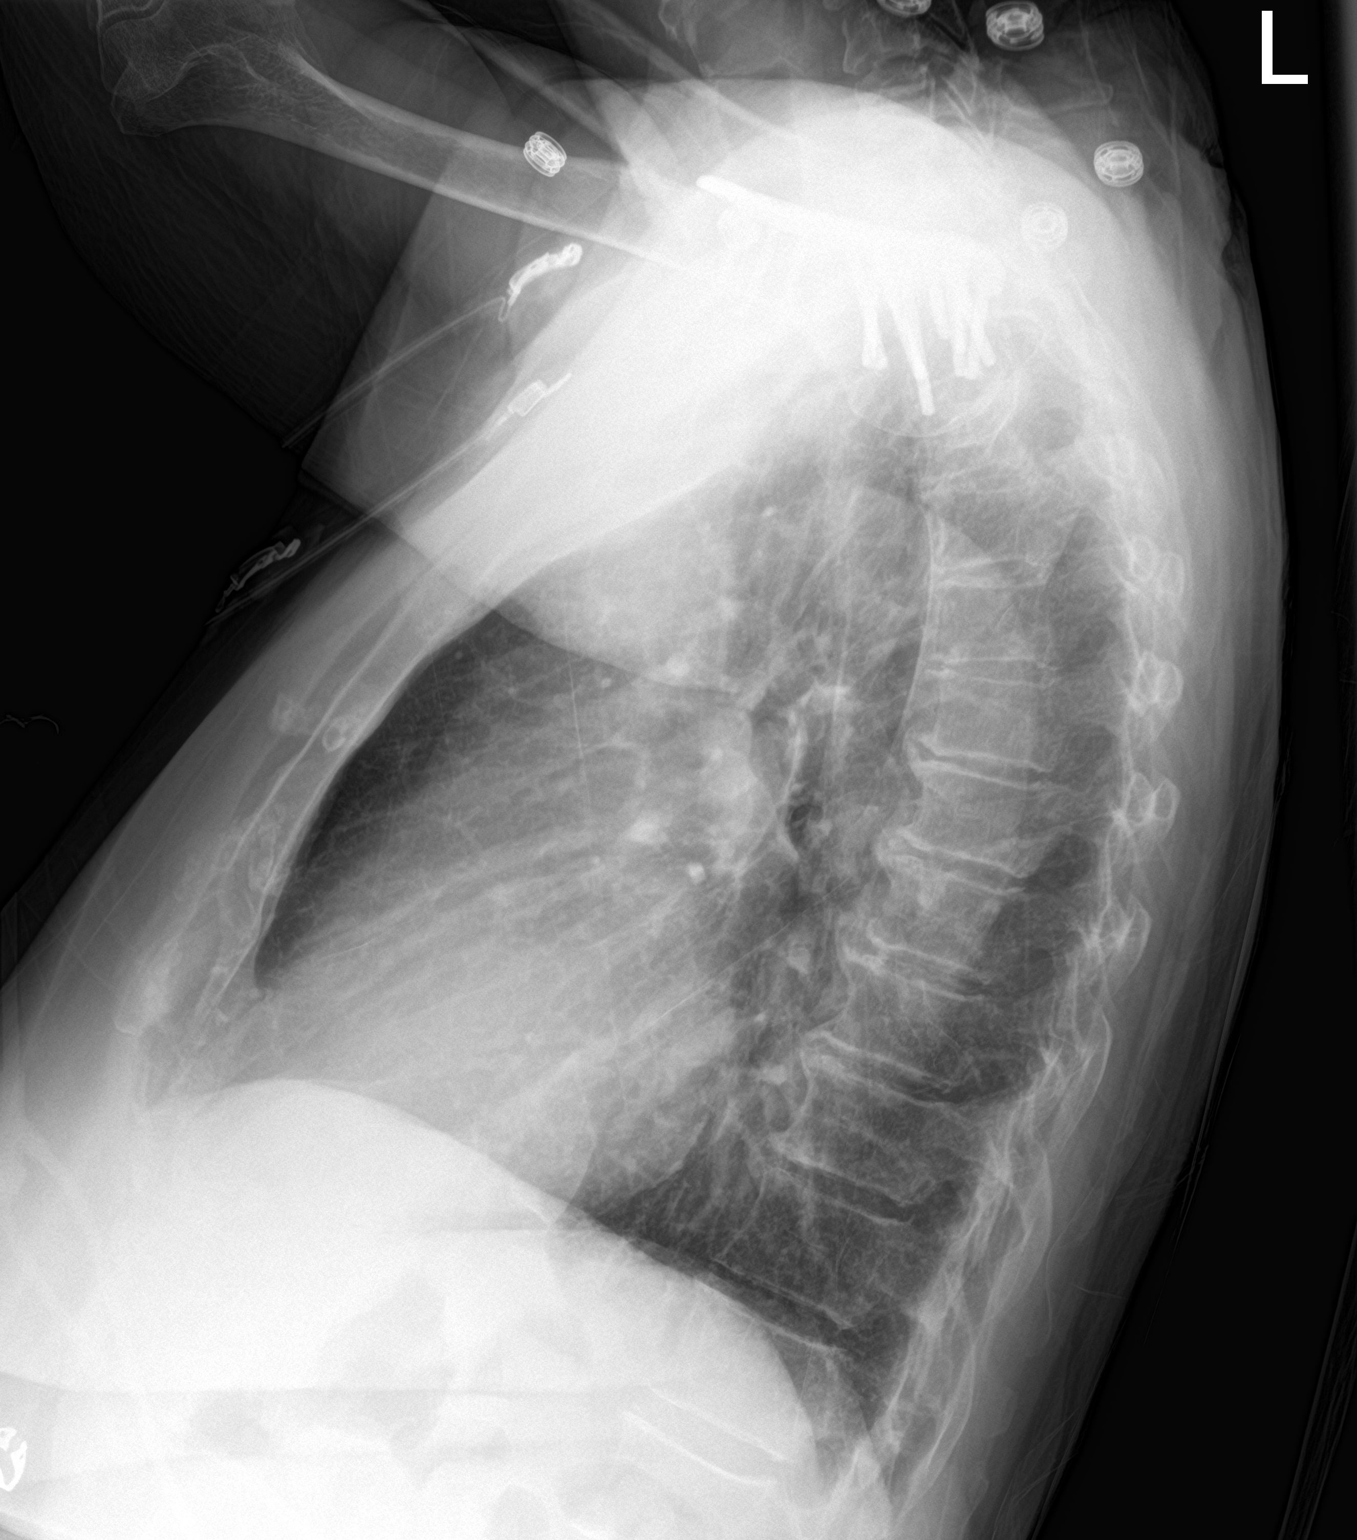

[chest ap]
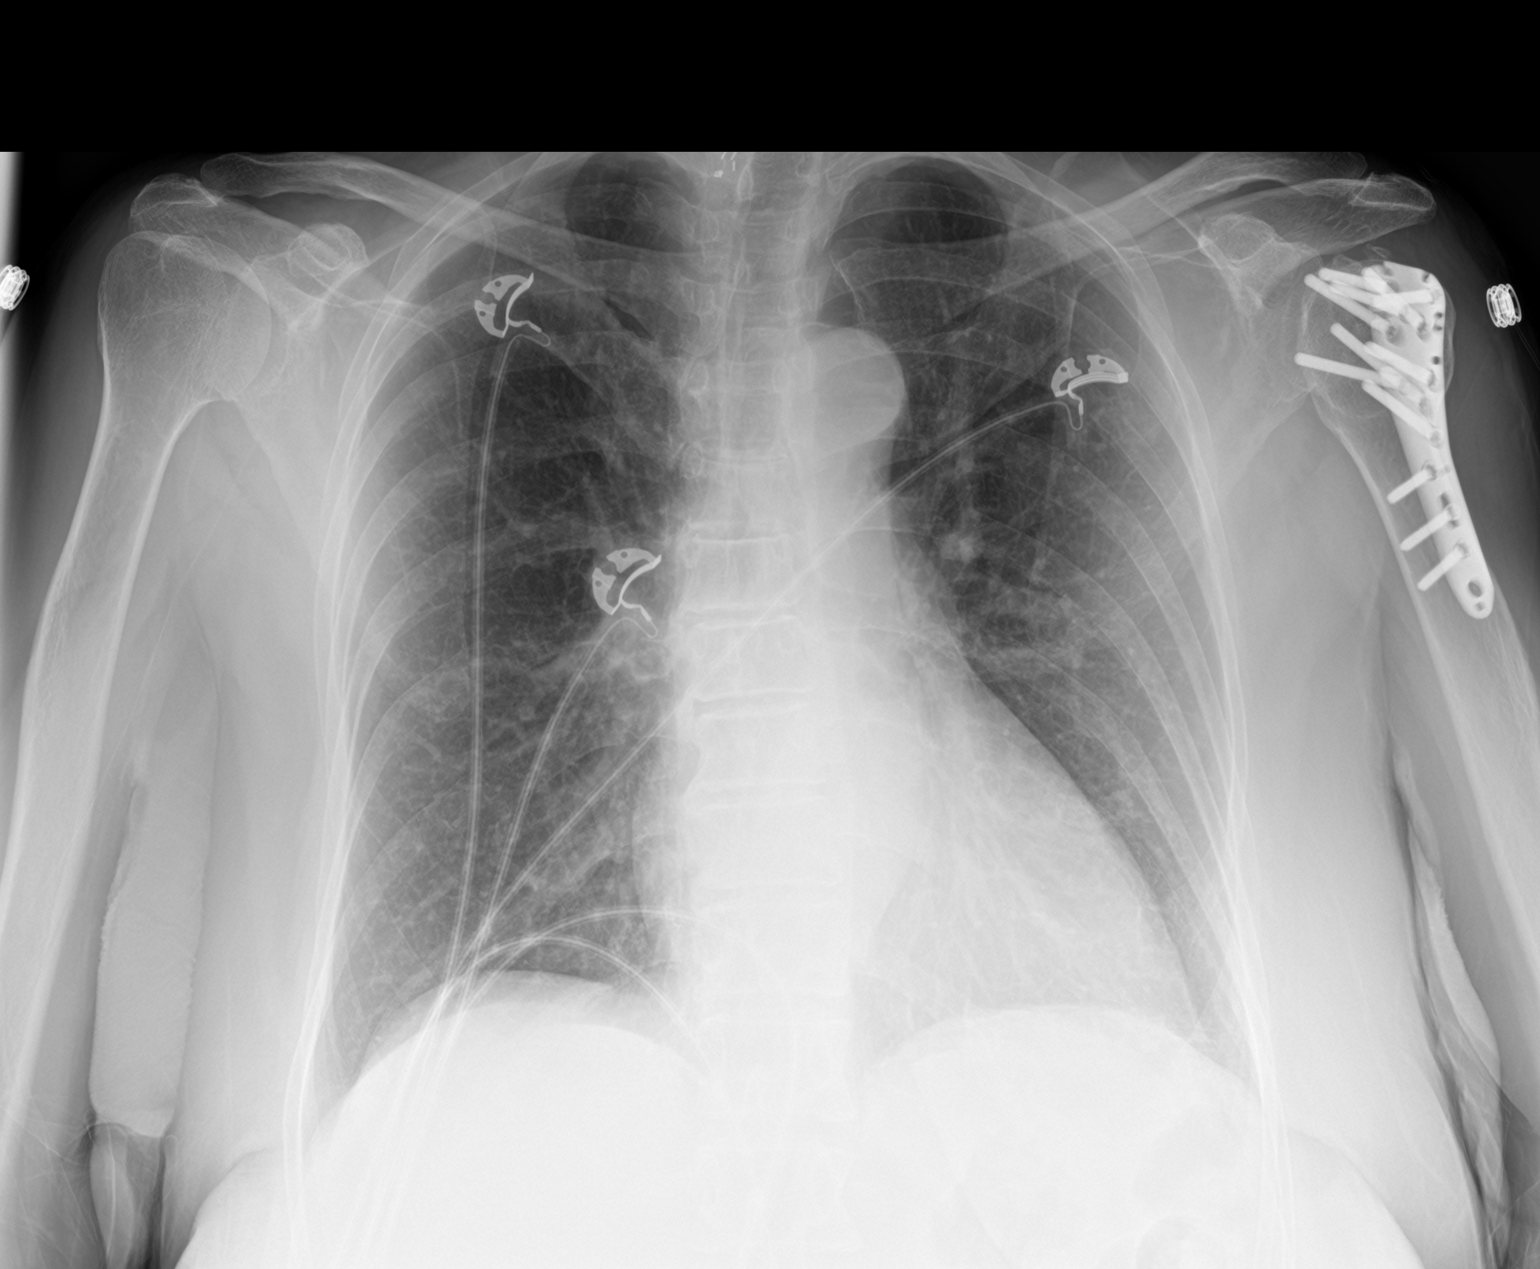

[2 of 2 positions shown; findings below may reference images not displayed]

FINDINGS: Heart size and vascularity normal. Lungs are clear without
infiltrate effusion or mass. Fracture fixation with hardware in the
proximal left humerus. Surgical clips in the right thyroid region.
IMPRESSION: No active cardiopulmonary disease.

## 2018-05-04 DIAGNOSIS — H1013 Acute atopic conjunctivitis, bilateral: Secondary | ICD-10-CM | POA: Diagnosis not present

## 2018-05-04 DIAGNOSIS — H402221 Chronic angle-closure glaucoma, left eye, mild stage: Secondary | ICD-10-CM | POA: Diagnosis not present

## 2018-05-04 DIAGNOSIS — H402213 Chronic angle-closure glaucoma, right eye, severe stage: Secondary | ICD-10-CM | POA: Diagnosis not present

## 2018-05-04 DIAGNOSIS — H04123 Dry eye syndrome of bilateral lacrimal glands: Secondary | ICD-10-CM | POA: Diagnosis not present

## 2018-05-08 DIAGNOSIS — L821 Other seborrheic keratosis: Secondary | ICD-10-CM | POA: Diagnosis not present

## 2018-05-22 ENCOUNTER — Other Ambulatory Visit: Payer: Self-pay | Admitting: Family Medicine

## 2018-05-24 ENCOUNTER — Other Ambulatory Visit: Payer: Self-pay | Admitting: Family Medicine

## 2018-05-24 ENCOUNTER — Telehealth: Payer: Self-pay

## 2018-05-24 NOTE — Telephone Encounter (Signed)
Marjory Lies from [facility] called to verify that pt Amy Barron is same person as Amy Barron; per demographics pt is the same and has same DOB and contact info. Marjory Lies voiced understanding. Nothing further needed.

## 2018-05-25 ENCOUNTER — Other Ambulatory Visit: Payer: Self-pay | Admitting: Family Medicine

## 2018-05-25 DIAGNOSIS — I10 Essential (primary) hypertension: Secondary | ICD-10-CM

## 2018-05-26 ENCOUNTER — Ambulatory Visit (INDEPENDENT_AMBULATORY_CARE_PROVIDER_SITE_OTHER): Payer: Medicare Other

## 2018-05-26 ENCOUNTER — Ambulatory Visit: Payer: Medicare Other

## 2018-05-26 VITALS — BP 110/80 | HR 69 | Temp 97.7°F | Ht 63.5 in | Wt 147.2 lb

## 2018-05-26 DIAGNOSIS — I10 Essential (primary) hypertension: Secondary | ICD-10-CM

## 2018-05-26 DIAGNOSIS — Z0000 Encounter for general adult medical examination without abnormal findings: Secondary | ICD-10-CM | POA: Diagnosis not present

## 2018-05-26 LAB — LIPID PANEL
CHOL/HDL RATIO: 4
Cholesterol: 218 mg/dL — ABNORMAL HIGH (ref 0–200)
HDL: 57.8 mg/dL (ref >39.00)
LDL CALC: 136 mg/dL — AB (ref 0–99)
NonHDL: 160.17
TRIGLYCERIDES: 123 mg/dL (ref 0.0–149.0)
VLDL: 24.6 mg/dL (ref 0.0–40.0)

## 2018-05-26 LAB — COMPREHENSIVE METABOLIC PANEL
ALT: 8 U/L (ref 0–35)
AST: 14 U/L (ref 0–37)
Albumin: 4.3 g/dL (ref 3.5–5.2)
Alkaline Phosphatase: 92 U/L (ref 39–117)
BUN: 18 mg/dL (ref 6–23)
CALCIUM: 9.7 mg/dL (ref 8.4–10.5)
CHLORIDE: 101 meq/L (ref 96–112)
CO2: 32 meq/L (ref 19–32)
CREATININE: 0.83 mg/dL (ref 0.40–1.20)
GFR: 71.02 mL/min (ref >60.00)
Glucose, Bld: 118 mg/dL — ABNORMAL HIGH (ref 70–99)
Potassium: 3.9 mEq/L (ref 3.5–5.1)
SODIUM: 142 meq/L (ref 135–145)
Total Bilirubin: 0.5 mg/dL (ref 0.2–1.2)
Total Protein: 7.1 g/dL (ref 6.0–8.3)

## 2018-05-26 NOTE — Progress Notes (Signed)
Subjective:   Amy Barron is a 77 y.o. female who presents for Medicare Annual (Subsequent) preventive examination.  Review of Systems:  N/A Cardiac Risk Factors include: advanced age (>49men, >59 women);dyslipidemia;hypertension     Objective:     Vitals: BP 110/80 (BP Location: Right Arm, Patient Position: Sitting, Cuff Size: Normal)   Pulse 69   Temp 97.7 F (36.5 C) (Oral)   Ht 5' 3.5" (1.613 m) Comment: shoes  Wt 147 lb 4 oz (66.8 kg)   SpO2 94%   BMI 25.68 kg/m   Body mass index is 25.68 kg/m.  Advanced Directives 05/26/2018 05/25/2017 11/12/2016 08/18/2015 07/30/2015 07/11/2015 07/01/2015  Does Patient Have a Medical Advance Directive? Yes Yes No Yes Yes Yes No  Type of Advance Directive Living will;Healthcare Power of [place];Living will - [place];Living will Living will;Healthcare Power of [place];Out of facility DNR (pink MOST or yellow form) Living will -  Does patient want to make changes to medical advance directive? - - - - - No - Patient declined -  Copy of [place] in Chart? No - copy requested No - copy requested - - - - -  Pre-existing out of facility DNR order (yellow form or pink MOST form) - - - - - - -    Tobacco Social History   Tobacco Use  Smoking Status Never Smoker  Smokeless Tobacco Never Used     Counseling given: No   Clinical Intake:  Pre-visit preparation completed: Yes  Pain Score: 0-No pain     Nutritional Status: BMI 25 -29 Overweight Nutritional Risks: None Diabetes: No  How often do you need to have someone help you when you read instructions, pamphlets, or other written materials from your doctor or pharmacy?: 1 - Never  Interpreter Needed?: No  Information entered by :: LPinson, LPN  Past Medical History:  Diagnosis Date  . Cataract 2018   corrected with surgery  . History of renal stone   . Hyperlipidemia   . Hypertension    Past  Surgical History:  Procedure Laterality Date  . ABDOMINAL HYSTERECTOMY  1998  . CATARACT EXTRACTION W/ INTRAOCULAR LENS  IMPLANT, BILATERAL  10/2016  . COLONOSCOPY W/ BIOPSIES AND POLYPECTOMY    . DILATION AND CURETTAGE OF UTERUS    . MULTIPLE TOOTH EXTRACTIONS    . ORIF HUMERUS FRACTURE Left 07/11/2015   Procedure: OPEN REDUCTION INTERNAL FIXATION (ORIF) PROXIMAL HUMERUS FRACTURE;  Surgeon: Marybelle Killings, MD;  Location: [facility];  Service: Orthopedics;  Laterality: Left;  . ORIF WRIST FRACTURE  01/18/2012   Procedure: OPEN REDUCTION INTERNAL FIXATION (ORIF) WRIST FRACTURE;  Surgeon: Marybelle Killings, MD;  Location: [facility];  Service: Orthopedics;  Laterality: Left;  . PARATHYROIDECTOMY    . TUBAL LIGATION     Family History  Problem Relation Age of Onset  . Cancer Father   . Colon cancer Father 31  . Diabetes Other   . Breast cancer Maternal Aunt    Social History   Socioeconomic History  . Marital status: Married    Spouse name: Not on file  . Number of children: Not on file  . Years of education: Not on file  . Highest education level: Not on file  Occupational History  . Not on file  Social Needs  . Financial resource strain: Not on file  . Food insecurity:    Worry: Not on file    Inability: Not on file  .  Transportation needs:    Medical: Not on file    Non-medical: Not on file  Tobacco Use  . Smoking status: Never Smoker  . Smokeless tobacco: Never Used  Substance and Sexual Activity  . Alcohol use: No  . Drug use: No  . Sexual activity: Never  Lifestyle  . Physical activity:    Days per week: Not on file    Minutes per session: Not on file  . Stress: Not on file  Relationships  . Social connections:    Talks on phone: Not on file    Gets together: Not on file    Attends religious service: Not on file    Active member of club or organization: Not on file    Attends meetings of clubs or organizations: Not on file    Relationship status: Not on file  Other Topics  Concern  . Not on file  Social History Narrative   Desires CPR.   Would not want prolonged life support if futile   She does have a Living Will.   Engaged to be married as of 2019.      Outpatient Encounter Medications as of 05/26/2018  Medication Sig  . amLODipine (NORVASC) 5 MG tablet TAKE 1 TABLET BY MOUTH EVERY DAY  . aspirin EC 81 MG tablet Take 81 mg by mouth daily.  Marland Kitchen etodolac (LODINE) 400 MG tablet TAKE 1 TABLET (400 MG TOTAL) BY MOUTH DAILY WITH FOOD.  . Multiple Vitamin (MULTIVITAMIN) tablet Take 1 tablet by mouth daily.  . Red Yeast Rice 600 MG TABS Take 1 tablet by mouth daily.   Marland Kitchen triamterene-hydrochlorothiazide (MAXZIDE-25) 37.5-25 MG tablet TAKE 1 TABLET BY MOUTH EVERY DAY  . [DISCONTINUED] Estradiol (YUVAFEM) 10 MCG TABS vaginal tablet Place 1 tab in the vagina as directed. Use daily for 2 weeks then twice a week as needed.   No facility-administered encounter medications on file as of 05/26/2018.     Activities of Daily Living In your present state of health, do you have any difficulty performing the following activities: 05/26/2018  Hearing? N  Vision? N  Difficulty concentrating or making decisions? N  Walking or climbing stairs? N  Dressing or bathing? N  Doing errands, shopping? N  Preparing Food and eating ? N  Using the Toilet? N  In the past six months, have you accidently leaked urine? N  Do you have problems with loss of bowel control? N  Managing your Medications? N  Managing your Finances? N  Housekeeping or managing your Housekeeping? N  Some recent data might be hidden    Patient Care Team: Tonia Ghent, MD as PCP - General (Family Medicine)    Assessment:   This is a routine wellness examination for Amy Barron.   Hearing Screening   125Hz  250Hz  500Hz  1000Hz  2000Hz  3000Hz  4000Hz  6000Hz  8000Hz   Right ear:   40 40 40  40    Left ear:   0 40 40  40    Vision Screening Comments: Vision exam in Oct 2019 with Dr. Herbert Deaner   Exercise Activities  and Dietary recommendations Current Exercise Habits: Structured exercise class;Home exercise routine, Type of exercise: yoga;walking, Time (Minutes): > 60, Frequency (Times/Week): 4, Weekly Exercise (Minutes/Week): 0, Intensity: Moderate, Exercise limited by: None identified  Goals    . Increase physical activity     Starting 05/25/2017, I will continue to exercise for at least 78 minutes 3 days per week.        Fall Risk Fall  Risk  05/25/2017 05/20/2016 04/08/2015  Falls in the past year? No No Yes  Number falls in past yr: - - 1  Injury with Fall? - - Yes  Comment - - broken tail bone   Depression Screen PHQ 2/9 Scores 05/25/2017 05/20/2016 04/08/2015  PHQ - 2 Score 0 0 0  PHQ- 9 Score 0 - -     Cognitive Function MMSE - Mini Mental State Exam 05/26/2018 05/25/2017  Orientation to time 5 5  Orientation to Place 5 5  Registration 3 3  Attention/ Calculation 0 0  Recall 3 3  Language- name 2 objects 0 0  Language- repeat 1 1  Language- follow 3 step command 3 3  Language- read & follow direction 0 0  Write a sentence 0 0  Copy design 0 0  Total score 20 20     PLEASE NOTE: A Mini-Cog screen was completed. Maximum score is 20. A value of 0 denotes this part of [place] MMSE was not completed or the patient failed this part of the Mini-Cog screening.   Mini-Cog Screening Orientation to Time - Max 5 pts Orientation to Place - Max 5 pts Registration - Max 3 pts Recall - Max 3 pts Language Repeat - Max 1 pts Language Follow 3 Step Command - Max 3 pts     Immunization History  Administered Date(s) Administered  . Influenza Split 04/26/2012  . Influenza,inj,Quad PF,6+ Mos 05/08/2013, 05/27/2017  . Pneumococcal Conjugate-13 04/12/2014  . Pneumococcal Polysaccharide-23 04/20/2011    Screening Tests Health Maintenance  Topic Date Due  . COLONOSCOPY  09/22/2018 (Originally 09/19/2017)  . INFLUENZA VACCINE  10/25/2018 (Originally 02/23/2018)  . MAMMOGRAM  06/13/2018  .  TETANUS/TDAP  04/20/2019  . DEXA SCAN  Completed  . PNA vac Low Risk Adult  Completed     Plan:     I have personally reviewed, addressed, and noted the following in the patient's chart:  A. Medical and social history B. Use of alcohol, tobacco or illicit drugs  C. Current medications and supplements D. Functional ability and status E.  Nutritional status F.  Physical activity G. Advance directives H. List of other physicians I.  Hospitalizations, surgeries, and ER visits in previous 12 months J.  [place] to include hearing, vision, cognitive, depression L. Referrals and appointments - none  In addition, I have reviewed and discussed with patient certain preventive protocols, quality metrics, and best practice recommendations. A written personalized care plan for preventive services as well as general preventive health recommendations were provided to patient.  See attached scanned questionnaire for additional information.   Signed,   Lindell Noe, MHA, BS, LPN Health Coach

## 2018-05-26 NOTE — Patient Instructions (Signed)
Amy Barron , Thank you for taking time to come for your Medicare Wellness Visit. I appreciate your ongoing commitment to your health goals. Please review the following plan we discussed and let me know if I can assist you in the future.   These are the goals we discussed: Goals    . Increase physical activity     Starting 05/25/2017, I will continue to exercise for at least 78 minutes 3 days per week.        This is a list of the screening recommended for you and due dates:  Health Maintenance  Topic Date Due  . Colon Cancer Screening  09/22/2018*  . Flu Shot  10/25/2018*  . Mammogram  06/13/2018  . Tetanus Vaccine  04/20/2019  . DEXA scan (bone density measurement)  Completed  . Pneumonia vaccines  Completed  *Topic was postponed. The date shown is not the original due date.   Preventive Care for Adults  A healthy lifestyle and preventive care can promote health and wellness. Preventive health guidelines for adults include the following key practices.  . A routine yearly physical is a good way to check with your health care provider about your health and preventive screening. It is a chance to share any concerns and updates on your health and to receive a thorough exam.  . Visit your dentist for a routine exam and preventive care every 6 months. Brush your teeth twice a day and floss once a day. Good oral hygiene prevents tooth decay and gum disease.  . The frequency of eye exams is based on your age, health, family medical history, use  of contact lenses, and other factors. Follow your health care provider's recommendations for frequency of eye exams.  . Eat a healthy diet. Foods like vegetables, fruits, whole grains, low-fat dairy products, and lean protein foods contain the nutrients you need without too many calories. Decrease your intake of foods high in solid fats, added sugars, and salt. Eat the right amount of calories for you. Get information about a proper diet from  your health care provider, if necessary.  . Regular physical exercise is one of the most important things you can do for your health. Most adults should get at least 150 minutes of moderate-intensity exercise (any activity that increases your heart rate and causes you to sweat) each week. In addition, most adults need muscle-strengthening exercises on 2 or more days a week.  Silver Sneakers may be a benefit available to you. To determine eligibility, you may visit the website: www.silversneakers.com or contact program at 817-013-5774 Mon-Fri between 8AM-8PM.   . Maintain a healthy weight. The body mass index (BMI) is a screening tool to identify possible weight problems. It provides an estimate of body fat based on height and weight. Your health care provider can find your BMI and can help you achieve or maintain a healthy weight.   For adults 20 years and older: ? A BMI below 18.5 is considered underweight. ? A BMI of 18.5 to 24.9 is normal. ? A BMI of 25 to 29.9 is considered overweight. ? A BMI of 30 and above is considered obese.   . Maintain normal blood lipids and cholesterol levels by exercising and minimizing your intake of saturated fat. Eat a balanced diet with plenty of fruit and vegetables. Blood tests for lipids and cholesterol should begin at age 2 and be repeated every 5 years. If your lipid or cholesterol levels are high, you are over 50, or  you are at high risk for heart disease, you may need your cholesterol levels checked more frequently. Ongoing high lipid and cholesterol levels should be treated with medicines if diet and exercise are not working.  . If you smoke, find out from your health care provider how to quit. If you do not use tobacco, please do not start.  . If you choose to drink alcohol, please do not consume more than 2 drinks per day. One drink is considered to be 12 ounces (355 mL) of beer, 5 ounces (148 mL) of wine, or 1.5 ounces (44 mL) of liquor.  . If you  are 46-56 years old, ask your health care provider if you should take aspirin to prevent strokes.  . Use sunscreen. Apply sunscreen liberally and repeatedly throughout the day. You should seek shade when your shadow is shorter than you. Protect yourself by wearing long sleeves, pants, a wide-brimmed hat, and sunglasses year round, whenever you are outdoors.  . Once a month, do a whole body skin exam, using a mirror to look at the skin on your back. Tell your health care provider of new moles, moles that have irregular borders, moles that are larger than a pencil eraser, or moles that have changed in shape or color.

## 2018-05-26 NOTE — Progress Notes (Signed)
PCP notes:   Health maintenance:  Flu vaccine - postponed/pt does not feel well today; has a cough  Abnormal screenings:   Hearing - failed  Hearing Screening   125Hz  250Hz  500Hz  1000Hz  2000Hz  3000Hz  4000Hz  6000Hz  8000Hz   Right ear:   40 40 40  40    Left ear:   0 40 40  40     Patient concerns:   None  Nurse concerns:  None  Next PCP appt:   05/29/18 @ 1045  I reviewed health advisor's note, was available for consultation on the day of service listed in this note, and agree with documentation and plan. Elsie Stain, MD.

## 2018-05-29 ENCOUNTER — Ambulatory Visit (INDEPENDENT_AMBULATORY_CARE_PROVIDER_SITE_OTHER): Payer: Medicare Other | Admitting: Family Medicine

## 2018-05-29 ENCOUNTER — Encounter: Payer: Self-pay | Admitting: Family Medicine

## 2018-05-29 VITALS — BP 110/80 | HR 69 | Temp 97.7°F | Ht 63.5 in | Wt 147.2 lb

## 2018-05-29 DIAGNOSIS — E785 Hyperlipidemia, unspecified: Secondary | ICD-10-CM

## 2018-05-29 DIAGNOSIS — I10 Essential (primary) hypertension: Secondary | ICD-10-CM | POA: Diagnosis not present

## 2018-05-29 DIAGNOSIS — Z23 Encounter for immunization: Secondary | ICD-10-CM

## 2018-05-29 DIAGNOSIS — Z7189 Other specified counseling: Secondary | ICD-10-CM

## 2018-05-29 DIAGNOSIS — M2550 Pain in unspecified joint: Secondary | ICD-10-CM | POA: Diagnosis not present

## 2018-05-29 DIAGNOSIS — Z0000 Encounter for general adult medical examination without abnormal findings: Secondary | ICD-10-CM

## 2018-05-29 MED ORDER — AMLODIPINE BESYLATE 5 MG PO TABS: 5.0000 mg | ORAL_TABLET | Freq: Every day | ORAL | 3 refills | Status: DC

## 2018-05-29 MED ORDER — ETODOLAC 400 MG PO TABS: ORAL_TABLET | ORAL | 3 refills | Status: DC

## 2018-05-29 MED ORDER — TRIAMTERENE-HCTZ 37.5-25 MG PO TABS: 1.0000 | ORAL_TABLET | Freq: Every day | ORAL | 3 refills | Status: DC

## 2018-05-29 NOTE — Patient Instructions (Addendum)
Please ask to have your husband's contact information updated in the chart.   Ask about a DPR up front.   Flu shot today.  Update me as needed.  Take care.  Glad to see you.

## 2018-05-29 NOTE — Progress Notes (Signed)
Cough noted for the last few weeks.  Is better in the last few days.  Tickle in her throat.  No sputum.  No fevers.  No wheeze.  She doesn't feel sick o/w.    Hypertension:    Using medication without problems or lightheadedness:  yes Chest pain with exertion:no Edema:no Short of breath:no Labs d/w pt.    Elevated Cholesterol: Using medications without problems: has been off RYR Muscle aches: not from med, off RYR Diet compliance:yes Exercise:yes Labs d/w pt.   D/w pt about possible ASA stop given NSAID use.    Joint pain is better with lodine.  No ADE on med.  Taken with food.  She fails trial off med, with more pain.    Colonoscopy 2014.  FH noted.  D/w pt about options.   she wouldn't want treatment or evaluation at this point.  No sx now, no blood in stool.  It she changes her mind then she can update me.   DXA 2018.  Not due now, d/w pt.   Mammogram d/w pt.  D/w pt about options.   she wouldn't want treatment or evaluation at this point.  Declined mammogram.   Living will d/w pt.  Husband designated if patient were incapacitated.  shingles out of stock.  D/w pt.   Flu shot d/w pt.   Diet and exercise.  Doing yoga 3 times a week.  Working in the yard.  Diet is healthy.  Weight is stable.   Mood is good.  D/w pt.  Declined hearing aids.   Safe at home.  Lives with husband, married 2019  Meds, vitals, and allergies reviewed.   PMH and SH reviewed  ROS: Per HPI unless specifically indicated in ROS section   GEN: nad, alert and oriented HEENT: mucous membranes moist NECK: supple w/o LA CV: rrr. PULM: ctab, no inc wob ABD: soft, +bs EXT: no edema SKIN: no acute rash

## 2018-05-31 NOTE — Assessment & Plan Note (Signed)
Reasonable to restart red yeast rice.  Labs discussed with patient.  Continue work on diet and exercise.

## 2018-05-31 NOTE — Assessment & Plan Note (Signed)
Colonoscopy 2014.  FH noted.  D/w pt about options.   she wouldn't want treatment or evaluation at this point.  No sx now, no blood in stool.  It she changes her mind then she can update me.   DXA 2018.  Not due now, d/w pt.   Mammogram d/w pt.  D/w pt about options.   she wouldn't want treatment or evaluation at this point.  Declined mammogram.   Living will d/w pt.  Husband designated if patient were incapacitated.  shingles out of stock.  D/w pt.   Flu shot d/w pt.   Diet and exercise.  Doing yoga 3 times a week.  Working in the yard.  Diet is healthy.  Weight is stable.   Mood is good.  D/w pt.  Declined hearing aids.   Safe at home.  Lives with husband, married 2019

## 2018-05-31 NOTE — Assessment & Plan Note (Signed)
Living will d/w pt.  Husband designated if patient were incapacitated.  

## 2018-05-31 NOTE — Assessment & Plan Note (Signed)
Better with Lodine use.  No adverse effect.  She takes with food.  She has more aches when she stops the medication.  Likely reasonable to continue for now.  Discussed.

## 2018-05-31 NOTE — Assessment & Plan Note (Addendum)
No change in meds at this point.  Labs discussed with patient.  Continue as is.  She agrees.  >25 minutes spent in face to face time with patient, >50% spent in counselling or coordination of care.

## 2018-07-28 ENCOUNTER — Encounter: Payer: Self-pay | Admitting: Family Medicine

## 2018-07-28 ENCOUNTER — Ambulatory Visit (INDEPENDENT_AMBULATORY_CARE_PROVIDER_SITE_OTHER): Payer: PPO | Admitting: Family Medicine

## 2018-07-28 ENCOUNTER — Ambulatory Visit (INDEPENDENT_AMBULATORY_CARE_PROVIDER_SITE_OTHER)
Admission: RE | Admit: 2018-07-28 | Discharge: 2018-07-28 | Disposition: A | Discharge status: Home / Self Care | Payer: PPO | Source: Ambulatory Visit | Attending: Family Medicine | Admitting: Family Medicine

## 2018-07-28 VITALS — BP 130/70 | HR 64 | Temp 97.8°F | Ht 63.5 in | Wt 148.2 lb

## 2018-07-28 DIAGNOSIS — R05 Cough: Secondary | ICD-10-CM

## 2018-07-28 DIAGNOSIS — R059 Cough, unspecified: Secondary | ICD-10-CM

## 2018-07-28 MED ORDER — LORATADINE 10 MG PO TABS: 10.0000 mg | ORAL_TABLET | Freq: Every day | ORAL | Status: DC

## 2018-07-28 MED ORDER — FLUTICASONE PROPIONATE 50 MCG/ACT NA SUSP: 2.0000 | Freq: Every day | NASAL | Status: DC

## 2018-07-28 NOTE — Progress Notes (Signed)
Cough.  Going on for about 6 weeks, also initially had some ear congestion but that resolved.  The cough didn't go away.  No sputum now or prev.  No FCNAVD.  Tickling in the throat triggers the cough.  Throat clearing type of cough.  No sick contacts prior to her getting sick.    Mucinex DM helps for about 9 hours but then the cough returns.    Meds, vitals, and allergies reviewed.   ROS: Per HPI unless specifically indicated in ROS section   nad ncat TM wnl B Mild nasal irritation.  MMM, OP wnl o/w.  No exudates. Neck supple, no LA rrr ctab Extremities well perfused.  No edema. Speaking in complete sentences.

## 2018-07-28 NOTE — Patient Instructions (Signed)
Go to the lab on the way out.  We'll contact you with your xray report. Change to claritin and flonase.  Take care.  Glad to see you.

## 2018-07-30 DIAGNOSIS — R05 Cough: Secondary | ICD-10-CM | POA: Insufficient documentation

## 2018-07-30 DIAGNOSIS — R059 Cough, unspecified: Secondary | ICD-10-CM | POA: Insufficient documentation

## 2018-07-30 NOTE — Assessment & Plan Note (Signed)
Nontoxic.  She could have a cough from postnasal drip or from a post infectious syndrome.  Check chest x-ray given the duration.  Change to Claritin and Flonase.  See after visit summary.  Update [place] as needed.  Okay for outpatient follow-up.

## 2018-08-25 ENCOUNTER — Other Ambulatory Visit: Payer: Self-pay | Admitting: Family Medicine

## 2018-08-25 NOTE — Telephone Encounter (Signed)
Electronic refill request. Etodolac Last office visit:   07/28/2018   Last Filled:    90 tablet 3 05/29/2018  Please advise.

## 2018-08-27 NOTE — Telephone Encounter (Signed)
Sent. Thanks.   

## 2018-08-31 DIAGNOSIS — H353131 Nonexudative age-related macular degeneration, bilateral, early dry stage: Secondary | ICD-10-CM | POA: Diagnosis not present

## 2018-08-31 DIAGNOSIS — H402221 Chronic angle-closure glaucoma, left eye, mild stage: Secondary | ICD-10-CM | POA: Diagnosis not present

## 2018-08-31 DIAGNOSIS — H402213 Chronic angle-closure glaucoma, right eye, severe stage: Secondary | ICD-10-CM | POA: Diagnosis not present

## 2018-08-31 DIAGNOSIS — H35033 Hypertensive retinopathy, bilateral: Secondary | ICD-10-CM | POA: Diagnosis not present

## 2019-05-22 ENCOUNTER — Other Ambulatory Visit: Payer: Self-pay | Admitting: Family Medicine

## 2019-05-22 DIAGNOSIS — I10 Essential (primary) hypertension: Secondary | ICD-10-CM

## 2019-05-22 DIAGNOSIS — R739 Hyperglycemia, unspecified: Secondary | ICD-10-CM

## 2019-06-01 ENCOUNTER — Ambulatory Visit: Payer: Medicare Other

## 2019-06-01 ENCOUNTER — Telehealth: Payer: Self-pay

## 2019-06-01 ENCOUNTER — Other Ambulatory Visit (INDEPENDENT_AMBULATORY_CARE_PROVIDER_SITE_OTHER): Payer: PPO

## 2019-06-01 ENCOUNTER — Ambulatory Visit: Payer: PPO

## 2019-06-01 DIAGNOSIS — R739 Hyperglycemia, unspecified: Secondary | ICD-10-CM

## 2019-06-01 DIAGNOSIS — I10 Essential (primary) hypertension: Secondary | ICD-10-CM | POA: Diagnosis not present

## 2019-06-01 LAB — COMPREHENSIVE METABOLIC PANEL
ALT: 11 U/L (ref 0–35)
AST: 17 U/L (ref 0–37)
Albumin: 4.3 g/dL (ref 3.5–5.2)
Alkaline Phosphatase: 64 U/L (ref 39–117)
BUN: 18 mg/dL (ref 6–23)
CO2: 31 mEq/L (ref 19–32)
Calcium: 9.3 mg/dL (ref 8.4–10.5)
Chloride: 102 mEq/L (ref 96–112)
Creatinine, Ser: 0.8 mg/dL (ref 0.40–1.20)
GFR: 69.54 mL/min (ref >60.00)
Glucose, Bld: 103 mg/dL — ABNORMAL HIGH (ref 70–99)
Potassium: 3.9 mEq/L (ref 3.5–5.1)
Sodium: 141 mEq/L (ref 135–145)
Total Bilirubin: 0.6 mg/dL (ref 0.2–1.2)
Total Protein: 6.8 g/dL (ref 6.0–8.3)

## 2019-06-01 LAB — LIPID PANEL
Cholesterol: 238 mg/dL — ABNORMAL HIGH (ref 0–200)
HDL: 59.8 mg/dL (ref >39.00)
LDL Cholesterol: 152 mg/dL — ABNORMAL HIGH (ref 0–99)
NonHDL: 178.01
Total CHOL/HDL Ratio: 4
Triglycerides: 131 mg/dL (ref 0.0–149.0)
VLDL: 26.2 mg/dL (ref 0.0–40.0)

## 2019-06-01 LAB — HEMOGLOBIN A1C: Hgb A1c MFr Bld: 5.7 % (ref 4.6–6.5)

## 2019-06-01 NOTE — Telephone Encounter (Signed)
Called patient 3 times trying to complete her Medicare Wellness visit. Patient never answered the phone. Left message notifying patient that appointment will be cancelled and completed at her upcoming physical with provider. Left my number for her to call me back in next few minutes if she wanted to complete it today.

## 2019-06-04 ENCOUNTER — Ambulatory Visit (INDEPENDENT_AMBULATORY_CARE_PROVIDER_SITE_OTHER): Payer: PPO | Admitting: Family Medicine

## 2019-06-04 ENCOUNTER — Other Ambulatory Visit: Payer: Self-pay

## 2019-06-04 ENCOUNTER — Encounter: Payer: Self-pay | Admitting: Family Medicine

## 2019-06-04 VITALS — BP 142/78 | HR 63 | Temp 98.0°F | Ht 63.0 in | Wt 150.6 lb

## 2019-06-04 DIAGNOSIS — Z7189 Other specified counseling: Secondary | ICD-10-CM

## 2019-06-04 DIAGNOSIS — Z0000 Encounter for general adult medical examination without abnormal findings: Secondary | ICD-10-CM

## 2019-06-04 DIAGNOSIS — I10 Essential (primary) hypertension: Secondary | ICD-10-CM

## 2019-06-04 DIAGNOSIS — E785 Hyperlipidemia, unspecified: Secondary | ICD-10-CM | POA: Diagnosis not present

## 2019-06-04 MED ORDER — TRIAMTERENE-HCTZ 37.5-25 MG PO TABS: 1.0000 | ORAL_TABLET | Freq: Every day | ORAL | 3 refills | Status: DC

## 2019-06-04 MED ORDER — AMLODIPINE BESYLATE 5 MG PO TABS: 5.0000 mg | ORAL_TABLET | Freq: Every day | ORAL | 3 refills | Status: DC

## 2019-06-04 NOTE — Progress Notes (Signed)
I have personally reviewed the Medicare Annual Wellness questionnaire and have noted 1. The patient's medical and social history 2. Their use of alcohol, tobacco or illicit drugs 3. Their current medications and supplements 4. The patient's functional ability including ADL's, fall risks, home safety risks and hearing or visual             impairment. 5. Diet and physical activities 6. Evidence for depression or mood disorders  The patients weight, height, BMI have been recorded in the chart and visual acuity is per eye clinic.  I have made referrals, counseling and provided education to the patient based review of the above and I have provided the pt with a written personalized care plan for preventive services.  Provider list updated- see scanned forms.  Routine anticipatory guidance given to patient.  See health maintenance. The possibility exists that previously documented standard health maintenance information may have been brought forward from a previous encounter into this note.  If needed, that same information has been updated to reflect the current situation based on today's encounter.    Flu 2020 Shingles discussed with patient PNA up-to-date Tetanus discussed with patient Colon cancer screening declined by patient. Breast cancer screening declined by patient. Bone density test deferred 2020 given Covid pandemic. Advance directive-husband designated if patient were incapacitated. Cognitive function addressed- see scanned forms- and if abnormal then additional documentation follows.   She has thick dark nail polish that likely affects her pulse ox reading, d/w pt.    Hypertension:    Using medication without problems or lightheadedness:  yes Chest pain with exertion:no Edema:no Short of breath:no Labs d/w pt.    Elevated Cholesterol: Using medications without problems: not on statin.  She is off RYR.  Muscle aches: not now, she possibly had aches on RYR.   Diet  compliance: encouraged.  Exercise:encouraged Labs d/w pt.  Declined statin.   PMH and SH reviewed  Meds, vitals, and allergies reviewed.   ROS: Per HPI.  Unless specifically indicated otherwise in HPI, the patient denies:  General: fever. Eyes: acute vision changes ENT: sore throat Cardiovascular: chest pain Respiratory: SOB GI: vomiting GU: dysuria Musculoskeletal: acute back pain Derm: acute rash Neuro: acute motor dysfunction Psych: worsening mood Endocrine: polydipsia Heme: bleeding Allergy: hayfever  GEN: nad, alert and oriented HEENT: ncat NECK: supple w/o LA CV: rrr. PULM: ctab, no inc wob ABD: soft, +bs EXT: no edema SKIN: no acute rash  Health Maintenance  Topic Date Due  . MAMMOGRAM  06/05/2020 (Originally 06/13/2018)  . COLONOSCOPY  06/05/2020 (Originally 09/19/2017)  . TETANUS/TDAP  06/05/2020 (Originally 04/20/2019)  . INFLUENZA VACCINE  Completed  . DEXA SCAN  Completed  . PNA vac Low Risk Adult  Completed

## 2019-06-04 NOTE — Patient Instructions (Addendum)
Check with your insurance to see if they will cover the shingrix and tetanus shots.  Let me know if you want to give pravastatin a try, even at a low dose a few times a week for your cholesterol.  Work on diet and exercise as best you can.  Thanks for getting a flu shot.   Let me know if you want to get a mammogram.  Take care.  Glad to see you.

## 2019-06-06 NOTE — Assessment & Plan Note (Signed)
Minimal elevation in her systolic.  No change in meds today as I do not want to induce hypotension.  She will update me as needed.  Labs discussed with patient.  She agrees.

## 2019-06-06 NOTE — Assessment & Plan Note (Signed)
Flu 2020 Shingles discussed with patient PNA up-to-date Tetanus discussed with patient Colon cancer screening declined by patient. Breast cancer screening declined by patient. Bone density test deferred 2020 given Covid pandemic. Advance directive-husband designated if patient were incapacitated. Cognitive function addressed- see scanned forms- and if abnormal then additional documentation follows.

## 2019-06-06 NOTE — Assessment & Plan Note (Signed)
Advance directive- husband designated if patient were incapacitated.  

## 2019-06-06 NOTE — Assessment & Plan Note (Signed)
Labs d/w pt.  Declined statin.  Rationale for trial of pravastatin discussed with patient.

## 2019-07-24 DIAGNOSIS — L570 Actinic keratosis: Secondary | ICD-10-CM | POA: Diagnosis not present

## 2019-07-24 DIAGNOSIS — L821 Other seborrheic keratosis: Secondary | ICD-10-CM | POA: Diagnosis not present

## 2019-07-24 DIAGNOSIS — D485 Neoplasm of uncertain behavior of skin: Secondary | ICD-10-CM | POA: Diagnosis not present

## 2019-08-17 ENCOUNTER — Other Ambulatory Visit: Payer: Self-pay | Admitting: Family Medicine

## 2019-08-17 NOTE — Telephone Encounter (Signed)
This was last filled 08/27/2018 #90 with 3 refills, but it looks like it was discontinued... please advise if okay to refill... AWV 05/2019

## 2019-08-19 NOTE — Telephone Encounter (Signed)
Please verify with patient.  I thought patient was off this medication.  Thanks.

## 2019-08-20 NOTE — Telephone Encounter (Signed)
Busy signal x 3, will try again tomorrow. °

## 2019-08-21 DIAGNOSIS — C44619 Basal cell carcinoma of skin of left upper limb, including shoulder: Secondary | ICD-10-CM | POA: Diagnosis not present

## 2019-08-24 NOTE — Telephone Encounter (Signed)
Patient states she takes these as needed, when her bones feel achey and stiff but not on a regular basis.

## 2019-08-24 NOTE — Telephone Encounter (Signed)
[facility] Night - Client Nonclinical Telephone Record AccessNurse Client [place] Night - Client Client Site [place] Physician Renford Dills - MD Contact Type Call Who Is Calling Patient / Member / Family / Caregiver Caller Name [place] Phone Number [phone] Call Type Message Only Information Provided Reason for Call Returning a Call from the Office Initial [place] is returning a call to the office Additional Comment Disp. Time Disposition Final User 08/23/2019 5:04:33 PM General Information Provided Yes Loraine Maple Call Closed By: Loraine Maple Transaction Date/Time: 08/23/2019 5:00:34 PM (ET)

## 2019-08-26 NOTE — Telephone Encounter (Signed)
Noted.  Sent.  Thanks. 

## 2019-09-04 DIAGNOSIS — L700 Acne vulgaris: Secondary | ICD-10-CM | POA: Diagnosis not present

## 2019-11-07 DIAGNOSIS — H353131 Nonexudative age-related macular degeneration, bilateral, early dry stage: Secondary | ICD-10-CM | POA: Diagnosis not present

## 2019-11-07 DIAGNOSIS — H35033 Hypertensive retinopathy, bilateral: Secondary | ICD-10-CM | POA: Diagnosis not present

## 2019-11-07 DIAGNOSIS — D492 Neoplasm of unspecified behavior of bone, soft tissue, and skin: Secondary | ICD-10-CM | POA: Diagnosis not present

## 2019-11-07 DIAGNOSIS — H402221 Chronic angle-closure glaucoma, left eye, mild stage: Secondary | ICD-10-CM | POA: Diagnosis not present

## 2019-11-07 DIAGNOSIS — H402213 Chronic angle-closure glaucoma, right eye, severe stage: Secondary | ICD-10-CM | POA: Diagnosis not present

## 2019-11-20 DIAGNOSIS — L0100 Impetigo, unspecified: Secondary | ICD-10-CM | POA: Diagnosis not present

## 2020-01-25 ENCOUNTER — Ambulatory Visit (INDEPENDENT_AMBULATORY_CARE_PROVIDER_SITE_OTHER): Payer: PPO | Admitting: Family Medicine

## 2020-01-25 ENCOUNTER — Encounter: Payer: Self-pay | Admitting: Family Medicine

## 2020-01-25 ENCOUNTER — Other Ambulatory Visit: Payer: Self-pay

## 2020-01-25 DIAGNOSIS — R21 Rash and other nonspecific skin eruption: Secondary | ICD-10-CM | POA: Diagnosis not present

## 2020-01-25 MED ORDER — METRONIDAZOLE 1 % EX GEL: Freq: Every day | CUTANEOUS | 0 refills | Status: DC

## 2020-01-25 NOTE — Progress Notes (Signed)
This visit occurred during the SARS-CoV-2 public health emergency.  Safety protocols were in place, including screening questions prior to the visit, additional usage of staff PPE, and extensive cleaning of exam room while observing appropriate contact time as indicated for disinfecting solutions.  Facial rash.  B itchy nasal rash.  Going on a few months. She tried oral cefdinir and topical clindamycin w/o relief. .  One spot will heal then she'll have another lesion erupt, some with whiteheads/containing pus.  No FCNAVD.  She doesn't fell unwell.  She had h/o similar in the past, treated with oral meds, years ago.   She had her covid vaccine.    She has R lower eyelid lesion that is getting smaller with warm compresses.    Meds, vitals, and allergies reviewed.   ROS: Per HPI unless specifically indicated in ROS section   nad ncat except for B nasal and maxillary rash, with papules and macules, blanching.  No ulceration. Benign appearing SK on the L side of the face.   Neck supple.  No lymphadenopathy.

## 2020-01-25 NOTE — Patient Instructions (Signed)
Likely rosacea.  Try metrogel and update me as needed.  Take care.  Glad to see you.

## 2020-01-28 DIAGNOSIS — R21 Rash and other nonspecific skin eruption: Secondary | ICD-10-CM | POA: Insufficient documentation

## 2020-01-28 NOTE — Assessment & Plan Note (Signed)
Potentially rosacea.  Discussed with patient about options.  Can use metronidazole gel and then update me if not improving.  She agrees.  Incidental benign-appearing seborrheic keratosis does not need intervention at this point.  Discussed.

## 2020-05-18 ENCOUNTER — Other Ambulatory Visit: Payer: Self-pay | Admitting: Family Medicine

## 2020-05-18 DIAGNOSIS — I10 Essential (primary) hypertension: Secondary | ICD-10-CM

## 2020-05-18 DIAGNOSIS — R739 Hyperglycemia, unspecified: Secondary | ICD-10-CM

## 2020-05-28 ENCOUNTER — Ambulatory Visit (INDEPENDENT_AMBULATORY_CARE_PROVIDER_SITE_OTHER): Payer: PPO

## 2020-05-28 ENCOUNTER — Other Ambulatory Visit: Payer: Self-pay

## 2020-05-28 DIAGNOSIS — Z0000 Encounter for general adult medical examination without abnormal findings: Secondary | ICD-10-CM

## 2020-05-28 NOTE — Patient Instructions (Signed)
Amy Barron , Thank you for taking time to come for your Medicare Wellness Visit. I appreciate your ongoing commitment to your health goals. Please review the following plan we discussed and let me know if I can assist you in the future.   Screening recommendations/referrals: Colonoscopy: declined Mammogram: declined Bone Density: declined Recommended yearly ophthalmology/optometry visit for glaucoma screening and checkup Recommended yearly dental visit for hygiene and checkup  Vaccinations: Influenza vaccine: declined, You have not decided if you want to get this yet or not Pneumococcal vaccine: Completed series Tdap vaccine: decline Shingles vaccine: declined  Covid-19:Completed series  Advanced directives: Please bring a copy of your POA (Power of Attorney) and/or Living Will to your next appointment.   Conditions/risks identified: hypertension, hyperlipidemia  Next appointment: Follow up in one year for your annual wellness visit    Preventive Care 78 Years and Older, Female Preventive care refers to lifestyle choices and visits with your health care provider that can promote health and wellness. What does preventive care include?  A yearly physical exam. This is also called an annual well check.  Dental exams once or twice a year.  Routine eye exams. Ask your health care provider how often you should have your eyes checked.  Personal lifestyle choices, including:  Daily care of your teeth and gums.  Regular physical activity.  Eating a healthy diet.  Avoiding tobacco and drug use.  Limiting alcohol use.  Practicing safe sex.  Taking low-dose aspirin every day.  Taking vitamin and mineral supplements as recommended by your health care provider. What happens during an annual well check? The services and screenings done by your health care provider during your annual well check will depend on your age, overall health, lifestyle risk factors, and family history  of disease. Counseling  Your health care provider may ask you questions about your:  Alcohol use.  Tobacco use.  Drug use.  Emotional well-being.  Home and relationship well-being.  Sexual activity.  Eating habits.  History of falls.  Memory and ability to understand (cognition).  Work and work Statistician.  Reproductive health. Screening  You may have the following tests or measurements:  Height, weight, and BMI.  Blood pressure.  Lipid and cholesterol levels. These may be checked every 5 years, or more frequently if you are over 61 years old.  Skin check.  Lung cancer screening. You may have this screening every year starting at age 57 if you have a 30-pack-year history of smoking and currently smoke or have quit within the past 15 years.  Fecal occult blood test (FOBT) of the stool. You may have this test every year starting at age 98.  Flexible sigmoidoscopy or colonoscopy. You may have a sigmoidoscopy every 5 years or a colonoscopy every 10 years starting at age 62.  Hepatitis C blood test.  Hepatitis B blood test.  Sexually transmitted disease (STD) testing.  Diabetes screening. This is done by checking your blood sugar (glucose) after you have not eaten for a while (fasting). You may have this done every 1-3 years.  Bone density scan. This is done to screen for osteoporosis. You may have this done starting at age 68.  Mammogram. This may be done every 1-2 years. Talk to your health care provider about how often you should have regular mammograms. Talk with your health care provider about your test results, treatment options, and if necessary, the need for more tests. Vaccines  Your health care provider may recommend certain vaccines, such as:  Influenza vaccine. This is recommended every year.  Tetanus, diphtheria, and acellular pertussis (Tdap, Td) vaccine. You may need a Td booster every 10 years.  Zoster vaccine. You may need this after age  7.  Pneumococcal 13-valent conjugate (PCV13) vaccine. One dose is recommended after age 54.  Pneumococcal polysaccharide (PPSV23) vaccine. One dose is recommended after age 41. Talk to your health care provider about which screenings and vaccines you need and how often you need them. This information is not intended to replace advice given to you by your health care provider. Make sure you discuss any questions you have with your health care provider. Document Released: 08/08/2015 Document Revised: 03/31/2016 Document Reviewed: 05/13/2015 Elsevier Interactive Patient Education  2017 [employer] Prevention in the Home Falls can cause injuries. They can happen to people of all ages. There are many things you can do to make your home safe and to help prevent falls. What can I do on the outside of my home?  Regularly fix the edges of walkways and driveways and fix any cracks.  Remove anything that might make you trip as you walk through a door, such as a raised step or threshold.  Trim any bushes or trees on the path to your home.  Use bright outdoor lighting.  Clear any walking paths of anything that might make someone trip, such as rocks or tools.  Regularly check to see if handrails are loose or broken. Make sure that both sides of any steps have handrails.  Any raised decks and porches should have guardrails on the edges.  Have any leaves, snow, or ice cleared regularly.  Use sand or salt on walking paths during winter.  Clean up any spills in your garage right away. This includes oil or grease spills. What can I do in the bathroom?  Use night lights.  Install grab bars by the toilet and in the tub and shower. Do not use towel bars as grab bars.  Use non-skid mats or decals in the tub or shower.  If you need to sit down in the shower, use a plastic, non-slip stool.  Keep the floor dry. Clean up any water that spills on the floor as soon as it happens.  Remove  soap buildup in the tub or shower regularly.  Attach bath mats securely with double-sided non-slip rug tape.  Do not have throw rugs and other things on the floor that can make you trip. What can I do in the bedroom?  Use night lights.  Make sure that you have a light by your bed that is easy to reach.  Do not use any sheets or blankets that are too big for your bed. They should not hang down onto the floor.  Have a firm chair that has side arms. You can use this for support while you get dressed.  Do not have throw rugs and other things on the floor that can make you trip. What can I do in the kitchen?  Clean up any spills right away.  Avoid walking on wet floors.  Keep items that you use a lot in easy-to-reach places.  If you need to reach something above you, use a strong step stool that has a grab bar.  Keep electrical cords out of the way.  Do not use floor polish or wax that makes floors slippery. If you must use wax, use non-skid floor wax.  Do not have throw rugs and other things on the floor that  can make you trip. What can I do with my stairs?  Do not leave any items on the stairs.  Make sure that there are handrails on both sides of the stairs and use them. Fix handrails that are broken or loose. Make sure that handrails are as long as the stairways.  Check any carpeting to make sure that it is firmly attached to the stairs. Fix any carpet that is loose or worn.  Avoid having throw rugs at the top or bottom of the stairs. If you do have throw rugs, attach them to the floor with carpet tape.  Make sure that you have a light switch at the top of the stairs and the bottom of the stairs. If you do not have them, ask someone to add them for you. What else can I do to help prevent falls?  Wear shoes that:  Do not have high heels.  Have rubber bottoms.  Are comfortable and fit you well.  Are closed at the toe. Do not wear sandals.  If you use a  stepladder:  Make sure that it is fully opened. Do not climb a closed stepladder.  Make sure that both sides of the stepladder are locked into place.  Ask someone to hold it for you, if possible.  Clearly mark and make sure that you can see:  Any grab bars or handrails.  First and last steps.  Where the edge of each step is.  Use tools that help you move around (mobility aids) if they are needed. These include:  Canes.  Walkers.  Scooters.  Crutches.  Turn on the lights when you go into a dark area. Replace any light bulbs as soon as they burn out.  Set up your furniture so you have a clear path. Avoid moving your furniture around.  If any of your floors are uneven, fix them.  If there are any pets around you, be aware of where they are.  Review your medicines with your doctor. Some medicines can make you feel dizzy. This can increase your chance of falling. Ask your doctor what other things that you can do to help prevent falls. This information is not intended to replace advice given to you by your health care provider. Make sure you discuss any questions you have with your health care provider. Document Released: 05/08/2009 Document Revised: 12/18/2015 Document Reviewed: 08/16/2014 Elsevier Interactive Patient Education  2017 [employer].

## 2020-05-28 NOTE — Progress Notes (Signed)
PCP notes:  Health Maintenance: Flu- declined (undecided) Dexa- declined   Abnormal Screenings: none   Patient concerns: Fatigue since receiving the covid vaccine   Nurse concerns: none   Next PCP appt.: 06/06/2020 @ 12 pm

## 2020-05-28 NOTE — Progress Notes (Addendum)
Subjective:   BLONDINE HOTTEL is a 79 y.o. female who presents for Medicare Annual (Subsequent) preventive examination.  Review of Systems: N/A     I connected with the patient today by telephone and verified that I am speaking with the correct person using two identifiers. Location patient: home Location nurse: work Persons participating in the telephone visit: patient, nurse.   I discussed the limitations, risks, security and privacy concerns of performing an evaluation and management service by telephone and the availability of in person appointments. I also discussed with the patient that there may be a patient responsible charge related to this service. The patient expressed understanding and verbally consented to this telephonic visit.        Cardiac Risk Factors include: advanced age (>21men, >52 women);hypertension;Other (see comment), Risk factor comments: hyperlipidemia     Objective:    Today's Vitals   There is no height or weight on file to calculate BMI.  Advanced Directives 05/28/2020 05/26/2018 05/25/2017 11/12/2016 08/18/2015 07/30/2015 07/11/2015  Does Patient Have a Medical Advance Directive? Yes Yes Yes No Yes Yes Yes  Type of Paramedic of [place];Living will Living will;Healthcare Power of [place];Living will - [place];Living will Living will;Healthcare Power of [place];Out of facility DNR (pink MOST or yellow form) Living will  Does patient want to make changes to medical advance directive? - - - - - - No - Patient declined  Copy of [place] in Chart? No - copy requested No - copy requested No - copy requested - - - -  Pre-existing out of facility DNR order (yellow form or pink MOST form) - - - - - - -    Current Medications (verified) Outpatient Encounter Medications as of 05/28/2020  Medication Sig  . amLODipine (NORVASC) 5 MG tablet Take 1 tablet (5 mg total) by  mouth daily.  Marland Kitchen etodolac (LODINE) 400 MG tablet TAKE 1 TABLET (400 MG) BY MOUTH DAILY AS NEEDED WITH FOOD.  Marland Kitchen metroNIDAZOLE (METROGEL) 1 % gel Apply topically daily.  . Multiple Vitamin (MULTIVITAMIN) tablet Take 1 tablet by mouth daily.  Marland Kitchen triamterene-hydrochlorothiazide (MAXZIDE-25) 37.5-25 MG tablet Take 1 tablet by mouth daily.   No facility-administered encounter medications on file as of 05/28/2020.    Allergies (verified) Phenergan [promethazine hcl] and Zofran [ondansetron hcl]   History: Past Medical History:  Diagnosis Date  . Cataract 2018   corrected with surgery  . History of renal stone   . Hyperlipidemia   . Hypertension    Past Surgical History:  Procedure Laterality Date  . ABDOMINAL HYSTERECTOMY  1998  . CATARACT EXTRACTION W/ INTRAOCULAR LENS  IMPLANT, BILATERAL  10/2016  . COLONOSCOPY W/ BIOPSIES AND POLYPECTOMY    . DILATION AND CURETTAGE OF UTERUS    . MULTIPLE TOOTH EXTRACTIONS    . ORIF HUMERUS FRACTURE Left 07/11/2015   Procedure: OPEN REDUCTION INTERNAL FIXATION (ORIF) PROXIMAL HUMERUS FRACTURE;  Surgeon: Marybelle Killings, MD;  Location: [place];  Service: Orthopedics;  Laterality: Left;  . ORIF WRIST FRACTURE  01/18/2012   Procedure: OPEN REDUCTION INTERNAL FIXATION (ORIF) WRIST FRACTURE;  Surgeon: Marybelle Killings, MD;  Location: [place];  Service: Orthopedics;  Laterality: Left;  . PARATHYROIDECTOMY    . TUBAL LIGATION     Family History  Problem Relation Age of Onset  . Cancer Father   . Colon cancer Father 69  . Diabetes Other   . Breast cancer Maternal  Aunt    Social History   Socioeconomic History  . Marital status: Married    Spouse name: Not on file  . Number of children: Not on file  . Years of education: Not on file  . Highest education level: Not on file  Occupational History  . Not on file  Tobacco Use  . Smoking status: Never Smoker  . Smokeless tobacco: Never Used  Vaping Use  . Vaping Use: Never used  Substance and Sexual Activity    . Alcohol use: No  . Drug use: No  . Sexual activity: Never  Other Topics Concern  . Not on file  Social History Narrative   Desires CPR.   Would not want prolonged life support if futile   She does have a Living Will.   Married 08/2017   Social Determinants of Health   Financial Resource Strain: Low Risk   . Difficulty of Paying Living Expenses: Not hard at all  Food Insecurity: No Food Insecurity  . Worried About Charity fundraiser in the Last Year: Never true  . Ran Out of Food in the Last Year: Never true  Transportation Needs: No Transportation Needs  . Lack of Transportation (Medical): No  . Lack of Transportation (Non-Medical): No  Physical Activity: Sufficiently Active  . Days of Exercise per Week: 5 days  . Minutes of Exercise per Session: 60 min  Stress: No Stress Concern Present  . Feeling of Stress : Not at all  Social Connections:   . Frequency of Communication with Friends and Family: Not on file  . Frequency of Social Gatherings with Friends and Family: Not on file  . Attends Religious Services: Not on file  . Active Member of Clubs or Organizations: Not on file  . Attends Archivist Meetings: Not on file  . Marital Status: Not on file    Tobacco Counseling Counseling given: Not Answered   Clinical Intake:  Pre-visit preparation completed: Yes  Pain : No/denies pain     Nutritional Risks: None Diabetes: No  How often do you need to have someone help you when you read instructions, pamphlets, or other written materials from your doctor or pharmacy?: 1 - Never What is the last grade level you completed in school?: 12th  Diabetic: No Nutrition Risk Assessment:  Has the patient had any N/V/D within the last 2 months?  No  Does the patient have any non-healing wounds?  No  Has the patient had any unintentional weight loss or weight gain?  No   Diabetes:  Is the patient diabetic?  No  If diabetic, was a CBG obtained today?  N/A Did  the patient bring in their glucometer from home?  N/A How often do you monitor your CBG's? N/A.   Financial Strains and Diabetes Management:  Are you having any financial strains with the device, your supplies or your medication? N/A.  Does the patient want to be seen by Chronic Care Management for management of their diabetes?  N/A Would the patient like to be referred to a Nutritionist or for Diabetic Management?  N/A  Interpreter Needed?: No  Information entered by :: CJohnson, LPN   Activities of Daily Living In your present state of health, do you have any difficulty performing the following activities: 05/28/2020  Hearing? N  Vision? N  Difficulty concentrating or making decisions? N  Walking or climbing stairs? N  Dressing or bathing? N  Doing errands, shopping? N  Preparing Food and eating ?  N  Using the Toilet? N  In the past six months, have you accidently leaked urine? N  Do you have problems with loss of bowel control? N  Managing your Medications? N  Managing your Finances? N  Housekeeping or managing your Housekeeping? N  Some recent data might be hidden    Patient Care Team: Tonia Ghent, MD as PCP - General (Family Medicine)  Indicate any recent Medical Services you may have received from other than [facility] providers in the past year (date may be approximate).     Assessment:   This is a routine wellness examination for [place].  Hearing/Vision screen  Hearing Screening   125Hz  250Hz  500Hz  1000Hz  2000Hz  3000Hz  4000Hz  6000Hz  8000Hz   Right ear:           Left ear:           Vision Screening Comments: Patient gets annual eye exams   Dietary issues and exercise activities discussed: Current Exercise Habits: Home exercise routine, Type of exercise: walking;yoga, Time (Minutes): 60, Frequency (Times/Week): 5, Weekly Exercise (Minutes/Week): 300, Intensity: Moderate, Exercise limited by: None identified  Goals    . Increase physical activity     Starting  05/25/2017, I will continue to exercise for at least 78 minutes 3 days per week.     . Patient Stated     05/28/2020, I will continue to do yoga 2 days a week and walk 3 days a week for 1 hour.       Depression Screen PHQ 2/9 Scores 05/28/2020 05/26/2018 05/25/2017 05/20/2016 04/08/2015  PHQ - 2 Score 0 0 0 0 0  PHQ- 9 Score 0 0 0 - -    Fall Risk Fall Risk  05/28/2020 05/26/2018 05/25/2017 05/20/2016 04/08/2015  Falls in the past year? 0 0 No No Yes  Number falls in past yr: 0 - - - 1  Injury with Fall? 0 - - - Yes  Comment - - - - broken tail bone  Risk for fall due to : Medication side effect - - - -  Follow up Falls evaluation completed;Falls prevention discussed - - - -    Any stairs in or around the home? Yes  If so, are there any without handrails? No  Home free of loose throw rugs in walkways, pet beds, electrical cords, etc? Yes  Adequate lighting in your home to reduce risk of falls? Yes   ASSISTIVE DEVICES UTILIZED TO PREVENT FALLS:  Life alert? No  Use of a cane, walker or w/c? No  Grab bars in the bathroom? No  Shower chair or bench in shower? No  Elevated toilet seat or a handicapped toilet? No   TIMED UP AND GO:  Was the test performed? N/A, telephonic visit .    Cognitive Function: MMSE - Mini Mental State Exam 05/28/2020 05/26/2018 05/25/2017  Orientation to time 5 5 5   Orientation to Place 5 5 5   Registration 3 3 3   Attention/ Calculation 5 0 0  Recall 3 3 3   Language- name 2 objects - 0 0  Language- repeat 1 1 1   Language- follow 3 step command - 3 3  Language- read & follow direction - 0 0  Write a sentence - 0 0  Copy design - 0 0  Total score - 20 20   Mini Cog  Mini-Cog screen was completed. Maximum score is 22. A value of 0 denotes this part of the MMSE was not completed or the patient failed this  part of the Mini-Cog screening.       Immunizations Immunization History  Administered Date(s) Administered  . Fluad Quad(high Dose 65+)  05/07/2019  . Influenza Split 04/26/2012  . Influenza,inj,Quad PF,6+ Mos 05/08/2013, 05/27/2017, 05/29/2018  . Janssen (J&J) SARS-COV-2 Vaccination 10/03/2019  . Pneumococcal Conjugate-13 04/12/2014  . Pneumococcal Polysaccharide-23 04/20/2011    TDAP status: Due, Education has been provided regarding the importance of this vaccine. Advised may receive this vaccine at local pharmacy or Health Dept. Aware to provide a copy of the vaccination record if obtained from local pharmacy or Health Dept. Verbalized acceptance and understanding. Flu Vaccine status: Declined, Education has been provided regarding the importance of this vaccine but patient still declined. Advised may receive this vaccine at local pharmacy or Health Dept. Aware to provide a copy of the vaccination record if obtained from local pharmacy or Health Dept. Verbalized acceptance and understanding. Pneumococcal vaccine status: Up to date Covid-19 vaccine status: Completed vaccines  Qualifies for Shingles Vaccine? Yes   Zostavax completed No   Shingrix Completed?: No.    Education has been provided regarding the importance of this vaccine. Patient has been advised to call insurance company to determine out of pocket expense if they have not yet received this vaccine. Advised may also receive vaccine at local pharmacy or Health Dept. Verbalized acceptance and understanding.  Screening Tests Health Maintenance  Topic Date Due  . Hepatitis C Screening  Never done  . MAMMOGRAM  06/05/2020 (Originally 06/13/2018)  . COLONOSCOPY  06/05/2020 (Originally 09/19/2017)  . TETANUS/TDAP  06/05/2020 (Originally 04/20/2019)  . INFLUENZA VACCINE  10/23/2020 (Originally 02/24/2020)  . DEXA SCAN  Completed  . COVID-19 Vaccine  Completed  . PNA vac Low Risk Adult  Completed    Health Maintenance  Health Maintenance Due  Topic Date Due  . Hepatitis C Screening  Never done    Colorectal cancer screening:declined Mammogram status:  declined Bone Density status: declined  Lung Cancer Screening: (Low Dose CT Chest recommended if Age 62-80 years, 30 pack-year currently smoking OR have quit w/in 15years.) does not qualify.    Additional Screening:  Hepatitis C Screening: does qualify; Completed due  Vision Screening: Recommended annual ophthalmology exams for early detection of glaucoma and other disorders of the eye. Is the patient up to date with their annual eye exam?  Yes  Who is the provider or what is the name of the office in which the patient attends annual eye exams? Dr. Pandora Leiter If pt is not established with a provider, would they like to be referred to a provider to establish care? No .   Dental Screening: Recommended annual dental exams for proper oral hygiene  Community Resource Referral / Chronic Care Management: CRR required this visit?  No   CCM required this visit?  No      Plan:     I have personally reviewed and noted the following in the patient's chart:   . Medical and social history . Use of alcohol, tobacco or illicit drugs  . Current medications and supplements . Functional ability and status . Nutritional status . Physical activity . Advanced directives . List of other physicians . Hospitalizations, surgeries, and ER visits in previous 12 months . Vitals . Screenings to include cognitive, depression, and falls . Referrals and appointments  In addition, I have reviewed and discussed with patient certain preventive protocols, quality metrics, and best practice recommendations. A written personalized care plan for preventive services as well as general preventive health  recommendations were provided to patient.   Due to this being a telephonic visit, the after visit summary with patients personalized plan was offered to patient via office or my-chart. Patient preferred to pick up at office at next visit or via mychart.   Andrez Grime, LPN   [id number]

## 2020-05-30 ENCOUNTER — Other Ambulatory Visit: Payer: Self-pay

## 2020-05-30 ENCOUNTER — Other Ambulatory Visit (INDEPENDENT_AMBULATORY_CARE_PROVIDER_SITE_OTHER): Payer: PPO

## 2020-05-30 DIAGNOSIS — R739 Hyperglycemia, unspecified: Secondary | ICD-10-CM

## 2020-05-30 DIAGNOSIS — I10 Essential (primary) hypertension: Secondary | ICD-10-CM

## 2020-05-30 LAB — COMPREHENSIVE METABOLIC PANEL
ALT: 15 U/L (ref 0–35)
AST: 23 U/L (ref 0–37)
Albumin: 4.4 g/dL (ref 3.5–5.2)
Alkaline Phosphatase: 71 U/L (ref 39–117)
BUN: 16 mg/dL (ref 6–23)
CO2: 32 mEq/L (ref 19–32)
Calcium: 9.4 mg/dL (ref 8.4–10.5)
Chloride: 100 mEq/L (ref 96–112)
Creatinine, Ser: 0.91 mg/dL (ref 0.40–1.20)
GFR: 60.6 mL/min (ref >60.00)
Glucose, Bld: 99 mg/dL (ref 70–99)
Potassium: 3.7 mEq/L (ref 3.5–5.1)
Sodium: 141 mEq/L (ref 135–145)
Total Bilirubin: 0.6 mg/dL (ref 0.2–1.2)
Total Protein: 7 g/dL (ref 6.0–8.3)

## 2020-05-30 LAB — LIPID PANEL
Cholesterol: 231 mg/dL — ABNORMAL HIGH (ref 0–200)
HDL: 57.9 mg/dL (ref >39.00)
LDL Cholesterol: 148 mg/dL — ABNORMAL HIGH (ref 0–99)
NonHDL: 173.45
Total CHOL/HDL Ratio: 4
Triglycerides: 129 mg/dL (ref 0.0–149.0)
VLDL: 25.8 mg/dL (ref 0.0–40.0)

## 2020-05-30 LAB — HEMOGLOBIN A1C: Hgb A1c MFr Bld: 5.9 % (ref 4.6–6.5)

## 2020-06-06 ENCOUNTER — Ambulatory Visit (INDEPENDENT_AMBULATORY_CARE_PROVIDER_SITE_OTHER): Payer: PPO | Admitting: Family Medicine

## 2020-06-06 ENCOUNTER — Encounter: Payer: Self-pay | Admitting: Family Medicine

## 2020-06-06 ENCOUNTER — Other Ambulatory Visit: Payer: Self-pay

## 2020-06-06 VITALS — BP 138/60 | HR 60 | Temp 98.2°F | Ht 64.0 in | Wt 158.0 lb

## 2020-06-06 DIAGNOSIS — I10 Essential (primary) hypertension: Secondary | ICD-10-CM

## 2020-06-06 DIAGNOSIS — Z0000 Encounter for general adult medical examination without abnormal findings: Secondary | ICD-10-CM

## 2020-06-06 DIAGNOSIS — M2550 Pain in unspecified joint: Secondary | ICD-10-CM | POA: Diagnosis not present

## 2020-06-06 DIAGNOSIS — R5383 Other fatigue: Secondary | ICD-10-CM

## 2020-06-06 DIAGNOSIS — Z7189 Other specified counseling: Secondary | ICD-10-CM

## 2020-06-06 DIAGNOSIS — R21 Rash and other nonspecific skin eruption: Secondary | ICD-10-CM

## 2020-06-06 LAB — CBC WITH DIFFERENTIAL/PLATELET
Basophils Absolute: 0.1 10*3/uL (ref 0.0–0.1)
Basophils Relative: 0.8 % (ref 0.0–3.0)
Eosinophils Absolute: 0.1 10*3/uL (ref 0.0–0.7)
Eosinophils Relative: 1.6 % (ref 0.0–5.0)
HCT: 41.5 % (ref 36.0–46.0)
Hemoglobin: 14 g/dL (ref 12.0–15.0)
Lymphocytes Relative: 28.9 % (ref 12.0–46.0)
Lymphs Abs: 2 10*3/uL (ref 0.7–4.0)
MCHC: 33.6 g/dL (ref 30.0–36.0)
MCV: 94 fl (ref 78.0–100.0)
Monocytes Absolute: 0.5 10*3/uL (ref 0.1–1.0)
Monocytes Relative: 7.4 % (ref 3.0–12.0)
Neutro Abs: 4.3 10*3/uL (ref 1.4–7.7)
Neutrophils Relative %: 61.3 % (ref 43.0–77.0)
Platelets: 290 10*3/uL (ref 150.0–400.0)
RBC: 4.42 Mil/uL (ref 3.87–5.11)
RDW: 13.5 % (ref 11.5–15.5)
WBC: 7 10*3/uL (ref 4.0–10.5)

## 2020-06-06 LAB — VITAMIN B12: Vitamin B-12: 214 pg/mL (ref 211–911)

## 2020-06-06 LAB — TSH: TSH: 2.93 u[IU]/mL (ref 0.35–4.50)

## 2020-06-06 MED ORDER — TRIAMTERENE-HCTZ 37.5-25 MG PO TABS
1.0000 | ORAL_TABLET | Freq: Every day | ORAL | 3 refills | Status: DC
Start: 2020-06-06 — End: 2021-06-25

## 2020-06-06 MED ORDER — ETODOLAC 400 MG PO TABS
ORAL_TABLET | ORAL | 3 refills | Status: DC
Start: 2020-06-06 — End: 2021-06-25

## 2020-06-06 MED ORDER — AMLODIPINE BESYLATE 5 MG PO TABS
5.0000 mg | ORAL_TABLET | Freq: Every day | ORAL | 3 refills | Status: DC
Start: 2020-06-06 — End: 2021-06-25

## 2020-06-06 NOTE — Progress Notes (Signed)
This visit occurred during the SARS-CoV-2 public health emergency.  Safety protocols were in place, including screening questions prior to the visit, additional usage of staff PPE, and extensive cleaning of exam room while observing appropriate contact time as indicated for disinfecting solutions.  Fatigue since covid vaccine.  Usually noted in the afternoon.  She can get tired, nap 15 minutes, then recover.  She had some occ fatigue prior to the shot, but more intense fatigue after the vaccine.  No FCANVD.  No blood in urine or stool.  Snores but no known apnea.    Hypertension:    Using medication without problems or lightheadedness: yes Chest pain with exertion:no Edema:no Short of breath:no Labs d/w pt.    She is still having facial breakouts with SK noted on the L side of the face, just lateral to the nose.  On metrogel at baseline and that decreased breakouts some.    Still on etodolac for joint pain.  It helps.  She failed taper.  No GI upset.  Cautions d/w pt.    Flu 2020, d/w pt.  See above.   Shingles discussed with patient PNA up-to-date Tetanus discussed with patient Colon cancer screening declined by patient. Breast cancer screening declined by patient. Bone density test d/w pt.  She can consider.   Advance directive-husband designated if patient were incapacitated.  Meds, vitals, and allergies reviewed.   ROS: Per HPI unless specifically indicated in ROS section   GEN: nad, alert and oriented HEENT: ncat NECK: supple w/o LA CV: rrr PULM: ctab, no inc wob ABD: soft, +bs EXT: no edema SKIN: no acute rash but she has an SK noted on the left side of the face, just lateral to the nose and a small irritated lesion just lateral to that.

## 2020-06-06 NOTE — Patient Instructions (Addendum)
Ask your husband if he notes your stopping breathing in your sleep.   Go to the lab on the way out.   If you have mychart we'll likely use that to update you.    Take care.  Glad to see you. We'll call about seeing dermatology.

## 2020-06-08 ENCOUNTER — Other Ambulatory Visit: Payer: Self-pay | Admitting: Family Medicine

## 2020-06-08 DIAGNOSIS — R5383 Other fatigue: Secondary | ICD-10-CM

## 2020-06-08 MED ORDER — CYANOCOBALAMIN 2000 MCG PO TABS: 2000.0000 ug | ORAL_TABLET | Freq: Every day | ORAL | Status: DC

## 2020-06-08 NOTE — Assessment & Plan Note (Signed)
Advance directive- husband designated if patient were incapacitated.  

## 2020-06-08 NOTE — Assessment & Plan Note (Signed)
Still on etodolac for joint pain.  It helps.  She failed taper.  No GI upset.  Cautions d/w pt.   would continue as is.

## 2020-06-08 NOTE — Assessment & Plan Note (Signed)
See notes on labs.  Continue triamterene hydrochlorothiazide and amlodipine.

## 2020-06-08 NOTE — Assessment & Plan Note (Signed)
Flu 2020, d/w pt.  See above.   Shingles discussed with patient PNA up-to-date Tetanus discussed with patient Colon cancer screening declined by patient. Breast cancer screening declined by patient. Bone density test d/w pt.  She can consider.   Advance directive-husband designated if patient were incapacitated.

## 2020-06-08 NOTE — Assessment & Plan Note (Signed)
She is still having facial breakouts with SK noted on the L side of the face, just lateral to the nose.  On metrogel at baseline and that decreased breakouts some.   Would continue MetroGel but refer to dermatology.

## 2020-06-08 NOTE — Assessment & Plan Note (Signed)
Unclear source.  See notes on follow-up labs. Fatigue noted since covid vaccine, unclear if that is causative.  Usually noted in the afternoon.  She can get tired, nap 15 minutes, then recover.  She had some occ fatigue prior to the shot, but more intense fatigue after the vaccine.  No FCANVD.  No blood in urine or stool.  Snores but no known apnea.  I asked her to check with her husband to see if he notices any abnormal breathing at night when she is asleep.  She may end up needing sleep apnea testing.

## 2020-07-21 ENCOUNTER — Other Ambulatory Visit (INDEPENDENT_AMBULATORY_CARE_PROVIDER_SITE_OTHER): Payer: PPO

## 2020-07-21 ENCOUNTER — Other Ambulatory Visit: Payer: Self-pay

## 2020-07-21 DIAGNOSIS — R5383 Other fatigue: Secondary | ICD-10-CM

## 2020-07-21 LAB — VITAMIN B12: Vitamin B-12: 775 pg/mL (ref 211–911)

## 2020-07-23 ENCOUNTER — Telehealth: Payer: Self-pay | Admitting: Family Medicine

## 2020-07-23 NOTE — Telephone Encounter (Signed)
Patient called back stating that she received a call from you. Please call the patient back. EM

## 2020-08-01 ENCOUNTER — Other Ambulatory Visit (INDEPENDENT_AMBULATORY_CARE_PROVIDER_SITE_OTHER): Payer: PPO | Admitting: Family Medicine

## 2020-08-01 ENCOUNTER — Telehealth (INDEPENDENT_AMBULATORY_CARE_PROVIDER_SITE_OTHER): Payer: PPO | Admitting: Family Medicine

## 2020-08-01 ENCOUNTER — Other Ambulatory Visit: Payer: PPO

## 2020-08-01 ENCOUNTER — Encounter: Payer: Self-pay | Admitting: Family Medicine

## 2020-08-01 VITALS — BP 126/75 | HR 81 | Temp 100.4°F | Wt 145.0 lb

## 2020-08-01 DIAGNOSIS — J029 Acute pharyngitis, unspecified: Secondary | ICD-10-CM | POA: Diagnosis not present

## 2020-08-01 DIAGNOSIS — B349 Viral infection, unspecified: Secondary | ICD-10-CM

## 2020-08-01 LAB — POCT RAPID STREP A (OFFICE): Rapid Strep A Screen: NEGATIVE

## 2020-08-01 LAB — POC INFLUENZA A&B (BINAX/QUICKVUE)
Influenza A, POC: NEGATIVE
Influenza B, POC: NEGATIVE

## 2020-08-01 NOTE — Assessment & Plan Note (Signed)
Ordered strep test/drive through

## 2020-08-01 NOTE — Progress Notes (Signed)
Virtual Visit via Telephone Note  I connected with Amy Barron on 08/01/20 at  9:30 AM EST by telephone and verified that I am speaking with the correct person using two identifiers.  Location: Patient: home Provider: office   I discussed the limitations, risks, security and privacy concerns of performing an evaluation and management service by telephone and the availability of in person appointments. I also discussed with the patient that there may be a patient responsible charge related to this service. The patient expressed understanding and agreed to proceed.  Parties involved in encounter  Patient: Amy Barron  Provider:  Loura Pardon MD   History of Present Illness: 80 yo pt of Dr Damita Dunnings presents for fever    Symptoms started Tuesday - headache and then fever 100.2 this am  Chills  Joints hurt  This am diarrhea  ST  No congestion  Coughed once   No sick contacts that she knows of   Did eat out once   No loss of taste or smell   No n/v  Is able to drink fluids    otc excedrin  Does not have tylenol   Vaccinated with J and J in march  Has not had booster  Patient Active Problem List   Diagnosis Date Noted  . Viral syndrome 08/01/2020  . Sore throat 08/01/2020  . Other fatigue 06/08/2020  . Facial rash 01/28/2020  . Cough 07/30/2018  . Postmenopausal 08/24/2017  . Healthcare maintenance 05/29/2017  . Advance care planning 05/29/2017  . Joint ache 05/29/2017  . Medicare annual wellness visit, subsequent 05/20/2016  . Well woman exam 04/15/2016  . GERD (gastroesophageal reflux disease) 08/14/2015  . Allergic rhinitis 04/08/2015  . HLD (hyperlipidemia) 09/30/2010  . Essential hypertension 09/30/2010   Past Medical History:  Diagnosis Date  . Cataract 2018   corrected with surgery  . History of renal stone   . Hyperlipidemia   . Hypertension    Past Surgical History:  Procedure Laterality Date  . ABDOMINAL HYSTERECTOMY  1998  .  CATARACT EXTRACTION W/ INTRAOCULAR LENS  IMPLANT, BILATERAL  10/2016  . COLONOSCOPY W/ BIOPSIES AND POLYPECTOMY    . DILATION AND CURETTAGE OF UTERUS    . MULTIPLE TOOTH EXTRACTIONS    . ORIF HUMERUS FRACTURE Left 07/11/2015   Procedure: OPEN REDUCTION INTERNAL FIXATION (ORIF) PROXIMAL HUMERUS FRACTURE;  Surgeon: Marybelle Killings, MD;  Location: [place];  Service: Orthopedics;  Laterality: Left;  . ORIF WRIST FRACTURE  01/18/2012   Procedure: OPEN REDUCTION INTERNAL FIXATION (ORIF) WRIST FRACTURE;  Surgeon: Marybelle Killings, MD;  Location: [place];  Service: Orthopedics;  Laterality: Left;  . PARATHYROIDECTOMY    . TUBAL LIGATION     Social History   Tobacco Use  . Smoking status: Never Smoker  . Smokeless tobacco: Never Used  Vaping Use  . Vaping Use: Never used  Substance Use Topics  . Alcohol use: No  . Drug use: No   Family History  Problem Relation Age of Onset  . Cancer Father   . Colon cancer Father 24  . Diabetes Other   . Breast cancer Maternal Aunt    Allergies  Allergen Reactions  . Phenergan [Promethazine Hcl] Other (See Comments)    Lack of effect, not an allergy  . Zofran [Ondansetron Hcl]     Lack of effect, not an allergy   Current Outpatient Medications on File Prior to Visit  Medication Sig Dispense Refill  . amLODipine (NORVASC) 5 MG tablet  Take 1 tablet (5 mg total) by mouth daily. 90 tablet 3  . cyanocobalamin ([employer] VITAMIN B12) 2000 MCG tablet Take 1 tablet (2,000 mcg total) by mouth daily.    Marland Kitchen etodolac (LODINE) 400 MG tablet TAKE 1 TABLET (400 MG) BY MOUTH DAILY AS NEEDED WITH FOOD. 90 tablet 3  . metroNIDAZOLE (METROGEL) 1 % gel Apply topically daily. 45 g 0  . Multiple Vitamin (MULTIVITAMIN) tablet Take 1 tablet by mouth daily.    Marland Kitchen triamterene-hydrochlorothiazide (MAXZIDE-25) 37.5-25 MG tablet Take 1 tablet by mouth daily. 90 tablet 3   No current facility-administered medications on file prior to visit.   Review of Systems  Constitutional: Positive for  fever and malaise/fatigue. Negative for chills.  HENT: Positive for sore throat. Negative for congestion, ear pain and sinus pain.   Eyes: Negative for blurred vision, discharge and redness.  Respiratory: Positive for cough. Negative for sputum production, shortness of breath, wheezing and stridor.   Cardiovascular: Negative for chest pain, palpitations and leg swelling.  Gastrointestinal: Positive for diarrhea. Negative for abdominal pain, nausea and vomiting.  Musculoskeletal: Negative for myalgias.  Skin: Negative for rash.  Neurological: Positive for headaches. Negative for dizziness.        Observations/Objective: Pt sounds well/no distress Voice is not hoarse  Nl cognition-good historian  Nl mood  No cough or wheeze heard during conversation   Assessment and Plan: Problem List Items Addressed This Visit      Other   Viral syndrome - Primary    Fever and headache, diarrhea and a little cough and ST covid vaccinated in march (J and J) but no booster  Drinking fluids inst to try tylenol for fever and headache Watch for severe symptoms (ER parameters given)  Ordered flu and covid and strep swabs Office will call to put on schedule  Disc imp of fluids and rest and isolation  Update if worse/ symptoms change  Pending test results      Sore throat    Ordered strep test/drive through          Follow Up Instructions: Drink fluids and rest  The office will call you to set up swabs for covid and flu and strep Isolate yourself until we get results  Tylenol may help fever and headache (acetaminophen)  If symptoms are suddenly severe (short of breath especially) go to the ER  Update [place] for changes or if worse  We will call with test results    I discussed the assessment and treatment plan with the patient. The patient was provided an opportunity to ask questions and all were answered. The patient agreed with the plan and demonstrated an understanding of the  instructions.   The patient was advised to call back or seek an in-person evaluation if the symptoms worsen or if the condition fails to improve as anticipated.  I provided 15 minutes of non-face-to-face time during this encounter.   Loura Pardon, MD

## 2020-08-01 NOTE — Assessment & Plan Note (Signed)
Fever and headache, diarrhea and a little cough and ST covid vaccinated in march (J and J) but no booster  Drinking fluids inst to try tylenol for fever and headache Watch for severe symptoms (ER parameters given)  Ordered flu and covid and strep swabs Office will call to put on schedule  Disc imp of fluids and rest and isolation  Update if worse/ symptoms change  Pending test results

## 2020-08-01 NOTE — Patient Instructions (Signed)
Drink fluids and rest  The office will call you to set up swabs for covid and flu and strep Isolate yourself until we get results  Tylenol may help fever and headache (acetaminophen)  If symptoms are suddenly severe (short of breath especially) go to the ER  Update [place] for changes or if worse  We will call with test results

## 2020-08-04 LAB — NOVEL CORONAVIRUS, NAA: SARS-CoV-2, NAA: DETECTED — AB

## 2020-08-29 DIAGNOSIS — L720 Epidermal cyst: Secondary | ICD-10-CM | POA: Diagnosis not present

## 2020-08-29 DIAGNOSIS — L821 Other seborrheic keratosis: Secondary | ICD-10-CM | POA: Diagnosis not present

## 2020-08-29 DIAGNOSIS — D485 Neoplasm of uncertain behavior of skin: Secondary | ICD-10-CM | POA: Diagnosis not present

## 2020-09-08 DIAGNOSIS — L905 Scar conditions and fibrosis of skin: Secondary | ICD-10-CM | POA: Diagnosis not present

## 2020-09-08 DIAGNOSIS — D485 Neoplasm of uncertain behavior of skin: Secondary | ICD-10-CM | POA: Diagnosis not present

## 2020-09-08 DIAGNOSIS — C441122 Basal cell carcinoma of skin of right lower eyelid, including canthus: Secondary | ICD-10-CM | POA: Diagnosis not present

## 2020-09-08 DIAGNOSIS — Z85828 Personal history of other malignant neoplasm of skin: Secondary | ICD-10-CM | POA: Diagnosis not present

## 2020-11-10 DIAGNOSIS — H353131 Nonexudative age-related macular degeneration, bilateral, early dry stage: Secondary | ICD-10-CM | POA: Diagnosis not present

## 2020-11-10 DIAGNOSIS — H402213 Chronic angle-closure glaucoma, right eye, severe stage: Secondary | ICD-10-CM | POA: Diagnosis not present

## 2020-11-10 DIAGNOSIS — H524 Presbyopia: Secondary | ICD-10-CM | POA: Diagnosis not present

## 2020-11-10 DIAGNOSIS — H402221 Chronic angle-closure glaucoma, left eye, mild stage: Secondary | ICD-10-CM | POA: Diagnosis not present

## 2020-11-10 DIAGNOSIS — H35013 Changes in retinal vascular appearance, bilateral: Secondary | ICD-10-CM | POA: Diagnosis not present

## 2020-12-23 NOTE — Progress Notes (Signed)
Leul Narramore T. Mahari Strahm, MD, [place] at [facility] [place], [address]  Phone: [phone]  FAX: [place] - 80 y.o. female  MRN [id number]  Date of Birth: [birth year]  Date: 12/24/2020  PCP: Tonia Ghent, MD  Referral: Tonia Ghent, MD  Chief Complaint  Patient presents with  . Hip Pain    Right      This visit occurred during the SARS-CoV-2 public health emergency.  Safety protocols were in place, including screening questions prior to the visit, additional usage of staff PPE, and extensive cleaning of exam room while observing appropriate contact time as indicated for disinfecting solutions.   Subjective:   Amy Barron is a 80 y.o. very pleasant female patient with Body mass index is 26.95 kg/m. who presents with the following:  She presents with R leg pain:  R low back is hurting and going all the way down to her knee.  It is hurting and sore some now.  Started on vacation last week.  Sunday came back and went to church.  Was going to go into get some water.  Felt something on the same side.  Jerked in the leg.   She did not have any 1 specific injury, but she had a few very minor thing in jerking and some discomfort back to normal She also has sitting most of the day and some uncomfortable hard chairs with her husband in a tent retreat.  She does not describe any specific numbness or tingling going down the entirety of the leg, but she does have some pain on the lateral aspect of her thigh.  She does not have any bowel or bladder incontinence or any significant prior back pain history with the exception of a fractured coccyx distantly.  No specific weakness.   Review of Systems is noted in the HPI, as appropriate   Objective:   BP (!) 152/80   Pulse 74   Temp 98.4 F (36.9 C) (Temporal)   Ht 5\' 4"  (1.626 m)   Wt 157 lb (71.2 kg)    SpO2 96%   BMI 26.95 kg/m    Range of motion at  the waist: Flexion, extension, lateral bending and rotation: Flexion to 90.  Full extension, lateral bending is relatively preserved.   No echymosis or edema Rises to examination table with mild difficulty Gait: minimally antalgic  Inspection/Deformity: N Paraspinus Tenderness: Mildly tender right posterior pelvis and to a minor extent the lumbar spine region L4-S1  B Ankle Dorsiflexion (L5,4): 5/5 B Great Toe Dorsiflexion (L5,4): 5/5 Heel Walk (L5): WNL Toe Walk (S1): WNL Rise/Squat (L4): WNL, mild pain  SENSORY B Medial Foot (L4): WNL B Dorsum (L5): WNL B Lateral (S1): WNL Light Touch: WNL Pinprick: WNL  REFLEXES Knee (L4): 2+ Ankle (S1): 2+  B SLR, seated: neg B SLR, supine: neg B FABER: Modest pain B Reverse FABER: neg B Greater Troch: NT B Log Roll: neg B Stork: NT B Sciatic Notch: NT   Radiology: No results found.  Assessment and Plan:     ICD-10-CM   1. Acute right-sided low back pain without sciatica  M54.50    Acute muscular injury after active weekend, minor injuries with seated on a very hard surface.  I anticipate that this will do well.  Reassurance of examination and anatomy involved.  Basic gentle stretching.  She does have a history  of being active, and I did give her some dedicated home rehab exercises.  If symptoms persist greater than 3 to 4 weeks then additional evaluation would be reasonable.  Patient Instructions  Etodolac take 1 tablet twice a day for day.  Take Tylenol/Acetaminophen ES (500mg ) 2 tabs by mouth three times a day max as needed.   Heat massage Warm bath Warm shower      Signed,  Indiana Pechacek T. Cherilyn Sautter, MD   Outpatient Encounter Medications as of 12/24/2020  Medication Sig  . amLODipine (NORVASC) 5 MG tablet Take 1 tablet (5 mg total) by mouth daily.  . cyanocobalamin ([employer] VITAMIN B12) 2000 MCG tablet Take 1 tablet (2,000 mcg total) by mouth daily.  Marland Kitchen etodolac  (LODINE) 400 MG tablet TAKE 1 TABLET (400 MG) BY MOUTH DAILY AS NEEDED WITH FOOD.  Marland Kitchen metroNIDAZOLE (METROGEL) 1 % gel Apply topically daily.  . Multiple Vitamin (MULTIVITAMIN) tablet Take 1 tablet by mouth daily.  Marland Kitchen triamterene-hydrochlorothiazide (MAXZIDE-25) 37.5-25 MG tablet Take 1 tablet by mouth daily.   No facility-administered encounter medications on file as of 12/24/2020.

## 2020-12-24 ENCOUNTER — Other Ambulatory Visit: Payer: Self-pay

## 2020-12-24 ENCOUNTER — Encounter: Payer: Self-pay | Admitting: Family Medicine

## 2020-12-24 ENCOUNTER — Ambulatory Visit (INDEPENDENT_AMBULATORY_CARE_PROVIDER_SITE_OTHER): Payer: PPO | Admitting: Family Medicine

## 2020-12-24 VITALS — BP 152/80 | HR 74 | Temp 98.4°F | Ht 64.0 in | Wt 157.0 lb

## 2020-12-24 DIAGNOSIS — M545 Low back pain, unspecified: Secondary | ICD-10-CM

## 2020-12-24 NOTE — Patient Instructions (Signed)
Etodolac take 1 tablet twice a day for day.  Take Tylenol/Acetaminophen ES (500mg ) 2 tabs by mouth three times a day max as needed.   Heat massage Warm bath Warm shower

## 2020-12-30 DIAGNOSIS — C441122 Basal cell carcinoma of skin of right lower eyelid, including canthus: Secondary | ICD-10-CM | POA: Diagnosis not present

## 2020-12-31 DIAGNOSIS — H019 Unspecified inflammation of eyelid: Secondary | ICD-10-CM | POA: Diagnosis not present

## 2020-12-31 DIAGNOSIS — Z481 Encounter for planned postprocedural wound closure: Secondary | ICD-10-CM | POA: Diagnosis not present

## 2020-12-31 DIAGNOSIS — Z85828 Personal history of other malignant neoplasm of skin: Secondary | ICD-10-CM | POA: Diagnosis not present

## 2020-12-31 DIAGNOSIS — S01111S Laceration without foreign body of right eyelid and periocular area, sequela: Secondary | ICD-10-CM | POA: Diagnosis not present

## 2020-12-31 DIAGNOSIS — S01411S Laceration without foreign body of right cheek and temporomandibular area, sequela: Secondary | ICD-10-CM | POA: Diagnosis not present

## 2020-12-31 DIAGNOSIS — S0181XS Laceration without foreign body of other part of head, sequela: Secondary | ICD-10-CM | POA: Diagnosis not present

## 2020-12-31 DIAGNOSIS — Z483 Aftercare following surgery for neoplasm: Secondary | ICD-10-CM | POA: Diagnosis not present

## 2020-12-31 DIAGNOSIS — S01412S Laceration without foreign body of left cheek and temporomandibular area, sequela: Secondary | ICD-10-CM | POA: Diagnosis not present

## 2020-12-31 DIAGNOSIS — S01112S Laceration without foreign body of left eyelid and periocular area, sequela: Secondary | ICD-10-CM | POA: Diagnosis not present

## 2020-12-31 DIAGNOSIS — C441122 Basal cell carcinoma of skin of right lower eyelid, including canthus: Secondary | ICD-10-CM | POA: Diagnosis not present

## 2021-02-06 ENCOUNTER — Telehealth: Payer: Self-pay

## 2021-02-06 NOTE — Telephone Encounter (Signed)
Patient called and informed that Dr. Damita Dunnings would like her to be seen over the weekend but the patient stated that she will see him on Monday at 2 pm. I did inform her if it got any worse to go to UC. Patient stated understanding and that she would go if it got worse.

## 2021-02-06 NOTE — Telephone Encounter (Signed)
Please advise her to get checked this weekend, even if not having sx now.  Thanks.

## 2021-02-06 NOTE — Telephone Encounter (Signed)
Pt called on 02/04/21 pt had gotten up during night to use restroom and when returned to be bed pt had dull ache in lt jaw that radiated down into lt chest. Pt said last for few mins but was the most intense and worse pain pt has ever had. Pain level on 02/04/21 was 8.  No SOB,H/A,dizziness and now problem walking or weakness in arms or legs.  02/05/21 BP 176/67 P?  And today BP 171/89 P 78.pt has been taking amlodipine and triamterene HCTZ in the mornings and pt has not missed BP meds. No N&V. Pt is concerned because her mother had a stroke about pts age. Pt said she called for an appt but will not go to UC or ED. Pt scheduled inoffice appt on 02/09/21 2PM with Dr Damita Dunnings. UC & ED precautions given again and pt voiced understanding. Sending note to Dr Damita Dunnings and Janett Billow CMA.

## 2021-02-09 ENCOUNTER — Other Ambulatory Visit: Payer: Self-pay

## 2021-02-09 ENCOUNTER — Ambulatory Visit (INDEPENDENT_AMBULATORY_CARE_PROVIDER_SITE_OTHER): Payer: PPO | Admitting: Family Medicine

## 2021-02-09 ENCOUNTER — Encounter: Payer: Self-pay | Admitting: Family Medicine

## 2021-02-09 VITALS — BP 142/80 | HR 71 | Temp 97.8°F | Ht 64.0 in | Wt 158.0 lb

## 2021-02-09 DIAGNOSIS — R0789 Other chest pain: Secondary | ICD-10-CM

## 2021-02-09 NOTE — Progress Notes (Signed)
This visit occurred during the SARS-CoV-2 public health emergency.  Safety protocols were in place, including screening questions prior to the visit, additional usage of staff PPE, and extensive cleaning of exam room while observing appropriate contact time as indicated for disinfecting solutions.  No chest pain currently.  She got up 3AM on Wednesday of last week, then had pain in L side of jaw, then pain radiated down the neck into the L chest wall. Quick onset, quick resolution.  Lasted a few seconds.  Didn't have time to wake up her husband before it resolved.  She she went back to sleep.    No sx like this prior.  No FCNAVD.  No CP now.  No pain walking for exercise, walking 2 miles at a time.  She is doing yoga w/o pain.  No h/o MI, CVA.  Rare GERD sx with TUMS used a few times a month, usually after eating trigger foods.    Some upper back soreness. Can be tender locally on palpation.    Meds, vitals, and allergies reviewed.   ROS: Per HPI unless specifically indicated in ROS section   GEN: nad, alert and oriented HEENT: ncat NECK: supple w/o LA CV: rrr PULM: ctab, no inc wob ABD: soft, +bs EXT: no edema SKIN: no acute rash  L SCM not ttp but was prev tender per patient report.   L upper back ttp medial to scapula.  L chest wall slightly ttp, L of mid sternal area  EKG reviewed and compared to previous.  Discussed with patient at office visit.  30 minutes were devoted to patient care in this encounter (this includes time spent reviewing the patient's file/history, interviewing and examining the patient, counseling/reviewing plan with patient).

## 2021-02-09 NOTE — Patient Instructions (Signed)
Likely with some muscle soreness.  Use heat locally.  We'll call about seeing cardiology.  If you have more chest pain, then go to the ER.  Take care.  Glad to see you.

## 2021-02-12 DIAGNOSIS — R0789 Other chest pain: Secondary | ICD-10-CM | POA: Insufficient documentation

## 2021-02-12 NOTE — Assessment & Plan Note (Signed)
Atypical chest pain, likely with some muscle soreness in the chest wall.  Discussed with patient about using heat locally.  The other issue is the set of symptoms she had at rest at 3 AM on Wednesday of last week.  EKG without acute changes but given the nature of her symptoms I would like her to see cardiology.  Referral placed and rationale discussed with patient.  She agreed.  If she has any more chest pain then I want her to go to the emergency room/seek evaluation.  She agrees with plan.

## 2021-02-15 NOTE — Progress Notes (Deleted)
Cardiology Office Note:    Date:  02/15/2021   ID:  Amy Barron, DOB 1941/01/24, MRN [id number]  PCP:  Tonia Ghent, MD  Cardiologist:  None  Electrophysiologist:  None   Referring MD: Tonia Ghent, MD   No chief complaint on file. ***  History of Present Illness:    Amy Barron is a 80 y.o. female with a hx of hypertension, hyperlipidemia who is referred by Dr. Damita Dunnings for evaluation of chest pain.  Past Medical History:  Diagnosis Date   Cataract 2018   corrected with surgery   History of renal stone    Hyperlipidemia    Hypertension     Past Surgical History:  Procedure Laterality Date   ABDOMINAL HYSTERECTOMY  1998   CATARACT EXTRACTION W/ INTRAOCULAR LENS  IMPLANT, BILATERAL  10/2016   COLONOSCOPY W/ BIOPSIES AND POLYPECTOMY     DILATION AND CURETTAGE OF UTERUS     MULTIPLE TOOTH EXTRACTIONS     ORIF HUMERUS FRACTURE Left 07/11/2015   Procedure: OPEN REDUCTION INTERNAL FIXATION (ORIF) PROXIMAL HUMERUS FRACTURE;  Surgeon: Marybelle Killings, MD;  Location: [facility];  Service: Orthopedics;  Laterality: Left;   ORIF WRIST FRACTURE  01/18/2012   Procedure: OPEN REDUCTION INTERNAL FIXATION (ORIF) WRIST FRACTURE;  Surgeon: Marybelle Killings, MD;  Location: [place];  Service: Orthopedics;  Laterality: Left;   PARATHYROIDECTOMY     TUBAL LIGATION      Current Medications: No outpatient medications have been marked as taking for the 02/16/21 encounter (Appointment) with Donato Heinz, MD.     Allergies:   Phenergan [promethazine hcl] and Zofran Alvis Lemmings hcl]   Social History   Socioeconomic History   Marital status: Married    Spouse name: Not on file   Number of children: Not on file   Years of education: Not on file   Highest education level: Not on file  Occupational History   Not on file  Tobacco Use   Smoking status: Never   Smokeless tobacco: Never  Vaping Use   Vaping Use: Never used  Substance and Sexual Activity   Alcohol use: No    Drug use: No   Sexual activity: Never  Other Topics Concern   Not on file  Social History Narrative   Desires CPR.   Would not want prolonged life support if futile   She does have a Living Will.   Married 08/2017   Social Determinants of Health   Financial Resource Strain: Low Risk    Difficulty of Paying Living Expenses: Not hard at all  Food Insecurity: No Food Insecurity   Worried About Charity fundraiser in the Last Year: Never true   Arboriculturist in the Last Year: Never true  Transportation Needs: No Transportation Needs   Lack of Transportation (Medical): No   Lack of Transportation (Non-Medical): No  Physical Activity: Sufficiently Active   Days of Exercise per Week: 5 days   Minutes of Exercise per Session: 60 min  Stress: No Stress Concern Present   Feeling of Stress : Not at all  Social Connections: Not on file     Family History: The patient's ***family history includes Breast cancer in her maternal aunt; Cancer in her father; Colon cancer (age of onset: 57) in her father; Diabetes in an other family member.  ROS:   Please see the history of present illness.    *** All other systems reviewed and are negative.  EKGs/Labs/Other Studies  Reviewed:    The following studies were reviewed today: ***  EKG:  EKG is *** ordered today.  The ekg ordered today demonstrates ***  Recent Labs: 05/30/2020: ALT 15; BUN 16; Creatinine, Ser 0.91; Potassium 3.7; Sodium 141 06/06/2020: Hemoglobin 14.0; Platelets 290.0; TSH 2.93  Recent Lipid Panel    Component Value Date/Time   CHOL 231 (H) 05/30/2020 0906   TRIG 129.0 05/30/2020 0906   HDL 57.90 05/30/2020 0906   CHOLHDL 4 05/30/2020 0906   VLDL 25.8 05/30/2020 0906   LDLCALC 148 (H) 05/30/2020 0906   LDLDIRECT 159.1 10/09/2010 1019    Physical Exam:    VS:  There were no vitals taken for this visit.    Wt Readings from Last 3 Encounters:  02/09/21 158 lb (71.7 kg)  12/24/20 157 lb (71.2 kg)  08/01/20 145 lb  (65.8 kg)     GEN: *** Well nourished, well developed in no acute distress HEENT: Normal NECK: No JVD; No carotid bruits LYMPHATICS: No lymphadenopathy CARDIAC: ***RRR, no murmurs, rubs, gallops RESPIRATORY:  Clear to auscultation without rales, wheezing or rhonchi  ABDOMEN: Soft, non-tender, non-distended MUSCULOSKELETAL:  No edema; No deformity  SKIN: Warm and dry NEUROLOGIC:  Alert and oriented x 3 PSYCHIATRIC:  Normal affect   ASSESSMENT:    No diagnosis found. PLAN:    Chest pain:  Hypertension: On triamterene-hydrochlorothiazide 37.5-25 mg daily and amlodipine 5 mg daily  Hyperlipidemia: LDL 148 on 05/30/2020  RTC in***   Medication Adjustments/Labs and Tests Ordered: Current medicines are reviewed at length with the patient today.  Concerns regarding medicines are outlined above.  No orders of the defined types were placed in this encounter.  No orders of the defined types were placed in this encounter.   There are no Patient Instructions on file for this visit.   Signed, Donato Heinz, MD  02/15/2021 1:38 PM    [facility]

## 2021-02-16 ENCOUNTER — Other Ambulatory Visit: Payer: Self-pay

## 2021-02-16 ENCOUNTER — Encounter: Payer: Self-pay | Admitting: Cardiology

## 2021-02-16 ENCOUNTER — Ambulatory Visit: Payer: PPO | Admitting: Cardiology

## 2021-02-16 VITALS — BP 128/80 | HR 68 | Resp 18 | Ht 64.0 in | Wt 158.8 lb

## 2021-02-16 DIAGNOSIS — E785 Hyperlipidemia, unspecified: Secondary | ICD-10-CM

## 2021-02-16 DIAGNOSIS — R079 Chest pain, unspecified: Secondary | ICD-10-CM

## 2021-02-16 DIAGNOSIS — I10 Essential (primary) hypertension: Secondary | ICD-10-CM | POA: Diagnosis not present

## 2021-02-16 MED ORDER — METOPROLOL TARTRATE 50 MG PO TABS: ORAL_TABLET | ORAL | 0 refills | Status: DC

## 2021-02-16 NOTE — Progress Notes (Addendum)
Cardiology Office Note:    Date:  02/16/2021   ID:  BRYLEI Barron, DOB 1941-03-17, MRN [id number]  PCP:  Tonia Ghent, MD  Cardiologist:  None  Electrophysiologist:  None   Referring MD: Tonia Ghent, MD   Chief Complaint  Patient presents with   Chest Pain    History of Present Illness:    Amy Barron is a 80 y.o. female with a hx of hypertension and hyperlipidemia who is referred by Dr. Damita Dunnings for evaluation of chest pain. She is accompanied today by her husband. Two weeks ago, she had an episode where she came back from the restroom and laid down in her bed, she noticed a sharp chest pain located on the left side of chest and lasted for a couple seconds and left jaw pain that lasted for about 5 minutes. She described chest pain as stabbing. She includes this was the only episode she's had with chest pain. Denies palpitations, lightheadedness, syncope or shortness of breath. She notices LE edema when travelling however has not travelled recently. She occasionally does yoga and walks for 25 minutes with no complications. Mother had a stroke at 36 yo and father died of colon cancer. She does not smoke.  Past Medical History:  Diagnosis Date   Cataract 2018   corrected with surgery   History of renal stone    Hyperlipidemia    Hypertension     Past Surgical History:  Procedure Laterality Date   ABDOMINAL HYSTERECTOMY  1998   CATARACT EXTRACTION W/ INTRAOCULAR LENS  IMPLANT, BILATERAL  10/2016   COLONOSCOPY W/ BIOPSIES AND POLYPECTOMY     DILATION AND CURETTAGE OF UTERUS     MULTIPLE TOOTH EXTRACTIONS     ORIF HUMERUS FRACTURE Left 07/11/2015   Procedure: OPEN REDUCTION INTERNAL FIXATION (ORIF) PROXIMAL HUMERUS FRACTURE;  Surgeon: Marybelle Killings, MD;  Location: [facility];  Service: Orthopedics;  Laterality: Left;   ORIF WRIST FRACTURE  01/18/2012   Procedure: OPEN REDUCTION INTERNAL FIXATION (ORIF) WRIST FRACTURE;  Surgeon: Marybelle Killings, MD;  Location: [facility];  Service:  Orthopedics;  Laterality: Left;   PARATHYROIDECTOMY     TUBAL LIGATION      Current Medications: Current Meds  Medication Sig   amLODipine (NORVASC) 5 MG tablet Take 1 tablet (5 mg total) by mouth daily.   etodolac (LODINE) 400 MG tablet TAKE 1 TABLET (400 MG) BY MOUTH DAILY AS NEEDED WITH FOOD.   metroNIDAZOLE (METROGEL) 1 % gel Apply topically daily.   Multiple Vitamin (MULTIVITAMIN) tablet Take 1 tablet by mouth daily.   triamterene-hydrochlorothiazide (MAXZIDE-25) 37.5-25 MG tablet Take 1 tablet by mouth daily.     Allergies:   Phenergan [promethazine hcl] and Zofran [ondansetron hcl]   Social History   Socioeconomic History   Marital status: Married    Spouse name: Not on file   Number of children: Not on file   Years of education: Not on file   Highest education level: Not on file  Occupational History   Not on file  Tobacco Use   Smoking status: Never   Smokeless tobacco: Never  Vaping Use   Vaping Use: Never used  Substance and Sexual Activity   Alcohol use: No   Drug use: No   Sexual activity: Never  Other Topics Concern   Not on file  Social History Narrative   Desires CPR.   Would not want prolonged life support if futile   She does have a Living Will.  Married 08/2017   Social Determinants of Health   Financial Resource Strain: Low Risk    Difficulty of Paying Living Expenses: Not hard at all  Food Insecurity: No Food Insecurity   Worried About Charity fundraiser in the Last Year: Never true   Arboriculturist in the Last Year: Never true  Transportation Needs: No Transportation Needs   Lack of Transportation (Medical): No   Lack of Transportation (Non-Medical): No  Physical Activity: Sufficiently Active   Days of Exercise per Week: 5 days   Minutes of Exercise per Session: 60 min  Stress: No Stress Concern Present   Feeling of Stress : Not at all  Social Connections: Not on file     Family History: The patient's family history includes  Breast cancer in her maternal aunt; Cancer in her father; Colon cancer (age of onset: 41) in her father; Diabetes in an other family member.  ROS:   Please see the history of present illness.    (+) chest pains, located left side of chest  (+) jaw pain, located left side  All other systems reviewed and are negative.  EKGs/Labs/Other Studies Reviewed:    The following studies were reviewed today:   EKG:   07/22: no EKG was ordered today 02/09/21: NSR, 1st degree AV block, rate 63, no ST abnormalities  Recent Labs: 05/30/2020: ALT 15; BUN 16; Creatinine, Ser 0.91; Potassium 3.7; Sodium 141 06/06/2020: Hemoglobin 14.0; Platelets 290.0; TSH 2.93  Recent Lipid Panel    Component Value Date/Time   CHOL 231 (H) 05/30/2020 0906   TRIG 129.0 05/30/2020 0906   HDL 57.90 05/30/2020 0906   CHOLHDL 4 05/30/2020 0906   VLDL 25.8 05/30/2020 0906   LDLCALC 148 (H) 05/30/2020 0906   LDLDIRECT 159.1 10/09/2010 1019    Physical Exam:    VS:  BP 128/80   Pulse 68   Resp 18   Ht '5\' 4"'$  (1.626 m)   Wt 158 lb 12.8 oz (72 kg)   SpO2 97%   BMI 27.26 kg/m     Wt Readings from Last 3 Encounters:  02/16/21 158 lb 12.8 oz (72 kg)  02/09/21 158 lb (71.7 kg)  12/24/20 157 lb (71.2 kg)     GEN:  Well nourished, well developed in no acute distress HEENT: Normal NECK: No JVD; No carotid bruits LYMPHATICS: No lymphadenopathy CARDIAC: RRR, no murmurs, rubs, gallops RESPIRATORY:  Clear to auscultation without rales, wheezing or rhonchi  ABDOMEN: Soft, non-tender, non-distended MUSCULOSKELETAL:  No edema; No deformity  SKIN: Warm and dry NEUROLOGIC:  Alert and oriented x 3 PSYCHIATRIC:  Normal affect   ASSESSMENT:    1. Chest pain of uncertain etiology   2. Essential hypertension   3. Hyperlipidemia, unspecified hyperlipidemia type    PLAN:    Chest pain: Atypical in description but does have CAD risk factors (age, hypertension, hyperlipidemia).  Overall would classify as intermediate  risk of obstructive CAD and further evaluation is warranted.  -Coronary CTA.  Will give metoprolol 50 mg prior to study -Echocardiogram  Hypertension: On triamterene-hydrochlorothiazide 37.5-25 mg daily and amlodipine 5 mg daily.  Appears controlled  Hyperlipidemia: LDL 148 on 05/30/2020.  10-year ASCVD risk score 28%.  Discussed starting statin but she declined, will f/u results of coronary CTA  RTC in 5 months   Medication Adjustments/Labs and Tests Ordered: Current medicines are reviewed at length with the patient today.  Concerns regarding medicines are outlined above.  No orders of the defined  types were placed in this encounter.  No orders of the defined types were placed in this encounter.   Patient Instructions  Medication Instructions:  Your physician recommends that you continue on your current medications as directed. Please refer to the Current Medication list given to you today.  *If you need a refill on your cardiac medications before your next appointment, please call your pharmacy*   Lab Work: BMET, Lipid  today  If you have labs (blood work) drawn today and your tests are completely normal, you will receive your results only by: [place] (if you have MyChart) OR A paper copy in the mail If you have any lab test that is abnormal or we need to change your treatment, we will call you to review the results.   Testing/Procedures: Your physician has requested that you have an echocardiogram. Echocardiography is a painless test that uses sound waves to create images of your heart. It provides your doctor with information about the size and shape of your heart and how well your heart's chambers and valves are working. This procedure takes approximately one hour. There are no restrictions for this procedure. This will be done at our [facility] location:  [address]  Coronary CTA-see instructions below  Follow-Up: At [facility], you and  your health needs are our priority.  As part of our continuing mission to provide you with exceptional heart care, we have created designated Provider Care Teams.  These Care Teams include your primary Cardiologist (physician) and Advanced Practice Providers (APPs -  Physician Assistants and Nurse Practitioners) who all work together to provide you with the care you need, when you need it.  We recommend signing up for the patient portal called "MyChart".  Sign up information is provided on this After Visit Summary.  MyChart is used to connect with patients for Virtual Visits (Telemedicine).  Patients are able to view lab/test results, encounter notes, upcoming appointments, etc.  Non-urgent messages can be sent to your provider as well.   To learn more about what you can do with MyChart, go to [email or web address].    Your next appointment:   5 month(s)  The format for your next appointment:   In Person  Provider:   Oswaldo Milian, MD   Other Instructions   Your cardiac CT will be scheduled at one of the below locations:   [facility] [address] [place], [place] [address] [phone]  If scheduled at [facility], please arrive at the [facility] main entrance (entrance A) of [facility] 30 minutes prior to test start time. Proceed to the [facility] Radiology Department (first floor) to check-in and test prep.  Please follow these instructions carefully (unless otherwise directed):  On the Night Before the Test: Be sure to Drink plenty of water. Do not consume any caffeinated/decaffeinated beverages or chocolate 12 hours prior to your test. Do not take any antihistamines 12 hours prior to your test.  On the Day of the Test: Drink plenty of water until 1 hour prior to the test. Do not eat any food 4 hours prior to the test. You may take your regular medications prior to the test.  Take metoprolol (Lopressor) two hours prior to  test. HOLD triamterene-hydrochlorothiazide morning of the test. FEMALES- please wear underwire-free bra if available, avoid dresses & tight clothing  After the Test: Drink plenty of water. After receiving IV contrast, you may experience a mild flushed feeling.  This is normal. On occasion, you may experience a mild rash up to 24 hours after the test. This is not dangerous. If this occurs, you can take Benadryl 25 mg and increase your fluid intake. If you experience trouble breathing, this can be serious. If it is severe call 911 IMMEDIATELY. If it is mild, please call our office. If you take any of these medications: Glipizide/Metformin, Avandament, Glucavance, please do not take 48 hours after completing test unless otherwise instructed.  Please allow 2-4 weeks for scheduling of routine cardiac CTs. Some insurance companies require a pre-authorization which may delay scheduling of this test.   For non-scheduling related questions, please contact the cardiac imaging nurse navigator should you have any questions/concerns: Marchia Bond, Cardiac Imaging Nurse Navigator Gordy Clement, Cardiac Imaging Nurse Navigator [facility] Direct Office Dial: [phone]   For scheduling needs, including cancellations and rescheduling, please call [place], [phone].     I,Jada Bradford,acting as a Education administrator for Donato Heinz, MD.,have documented all relevant documentation on the behalf of Donato Heinz, MD,as directed by  Donato Heinz, MD while in the presence of Donato Heinz, MD.  I, Donato Heinz, MD, have reviewed all documentation for this visit. The documentation on 02/16/21 for the exam, diagnosis, procedures, and orders are all accurate and complete.  Signed, Donato Heinz, MD  02/16/2021 10:46 AM    [place]

## 2021-02-16 NOTE — Patient Instructions (Signed)
Medication Instructions:  Your physician recommends that you continue on your current medications as directed. Please refer to the Current Medication list given to you today.  *If you need a refill on your cardiac medications before your next appointment, please call your pharmacy*   Lab Work: BMET, Lipid  today  If you have labs (blood work) drawn today and your tests are completely normal, you will receive your results only by: [place] (if you have MyChart) OR A paper copy in the mail If you have any lab test that is abnormal or we need to change your treatment, we will call you to review the results.   Testing/Procedures: Your physician has requested that you have an echocardiogram. Echocardiography is a painless test that uses sound waves to create images of your heart. It provides your doctor with information about the size and shape of your heart and how well your heart's chambers and valves are working. This procedure takes approximately one hour. There are no restrictions for this procedure. This will be done at our [facility] location:  [address]  Coronary CTA-see instructions below  Follow-Up: At [facility], you and your health needs are our priority.  As part of our continuing mission to provide you with exceptional heart care, we have created designated Provider Care Teams.  These Care Teams include your primary Cardiologist (physician) and Advanced Practice Providers (APPs -  Physician Assistants and Nurse Practitioners) who all work together to provide you with the care you need, when you need it.  We recommend signing up for the patient portal called "MyChart".  Sign up information is provided on this After Visit Summary.  MyChart is used to connect with patients for Virtual Visits (Telemedicine).  Patients are able to view lab/test results, encounter notes, upcoming appointments, etc.  Non-urgent messages can be sent to your provider as  well.   To learn more about what you can do with MyChart, go to [email or web address].    Your next appointment:   5 month(s)  The format for your next appointment:   In Person  Provider:   Oswaldo Milian, MD   Other Instructions   Your cardiac CT will be scheduled at one of the below locations:   [facility] [address] [place], [place] [address] [phone]  If scheduled at [facility], please arrive at the [facility] main entrance (entrance A) of [facility] 30 minutes prior to test start time. Proceed to the [facility] Radiology Department (first floor) to check-in and test prep.  Please follow these instructions carefully (unless otherwise directed):  On the Night Before the Test: Be sure to Drink plenty of water. Do not consume any caffeinated/decaffeinated beverages or chocolate 12 hours prior to your test. Do not take any antihistamines 12 hours prior to your test.  On the Day of the Test: Drink plenty of water until 1 hour prior to the test. Do not eat any food 4 hours prior to the test. You may take your regular medications prior to the test.  Take metoprolol (Lopressor) two hours prior to test. HOLD triamterene-hydrochlorothiazide morning of the test. FEMALES- please wear underwire-free bra if available, avoid dresses & tight clothing  After the Test: Drink plenty of water. After receiving IV contrast, you may experience a mild flushed feeling. This is normal. On occasion, you may experience a mild rash up to 24 hours after the test. This is not dangerous. If this  occurs, you can take Benadryl 25 mg and increase your fluid intake. If you experience trouble breathing, this can be serious. If it is severe call 911 IMMEDIATELY. If it is mild, please call our office. If you take any of these medications: Glipizide/Metformin, Avandament, Glucavance, please do not take 48 hours after completing test unless otherwise  instructed.  Please allow 2-4 weeks for scheduling of routine cardiac CTs. Some insurance companies require a pre-authorization which may delay scheduling of this test.   For non-scheduling related questions, please contact the cardiac imaging nurse navigator should you have any questions/concerns: Marchia Bond, Cardiac Imaging Nurse Navigator Gordy Clement, Cardiac Imaging Nurse Navigator [facility] Direct Office Dial: [phone]   For scheduling needs, including cancellations and rescheduling, please call [place], [phone].

## 2021-02-17 LAB — LIPID PANEL
Chol/HDL Ratio: 4.4 ratio (ref 0.0–4.4)
Cholesterol, Total: 266 mg/dL — ABNORMAL HIGH (ref 100–199)
HDL: 61 mg/dL (ref >39)
LDL Chol Calc (NIH): 171 mg/dL — ABNORMAL HIGH (ref 0–99)
Triglycerides: 184 mg/dL — ABNORMAL HIGH (ref 0–149)
VLDL Cholesterol Cal: 34 mg/dL (ref 5–40)

## 2021-02-17 LAB — BASIC METABOLIC PANEL
BUN/Creatinine Ratio: 18 (ref 12–28)
BUN: 19 mg/dL (ref 8–27)
CO2: 26 mmol/L (ref 20–29)
Calcium: 10.2 mg/dL (ref 8.7–10.3)
Chloride: 98 mmol/L (ref 96–106)
Creatinine, Ser: 1.03 mg/dL — ABNORMAL HIGH (ref 0.57–1.00)
Glucose: 97 mg/dL (ref 65–99)
Potassium: 4.2 mmol/L (ref 3.5–5.2)
Sodium: 141 mmol/L (ref 134–144)
eGFR: 56 mL/min/{1.73_m2} — ABNORMAL LOW (ref >59)

## 2021-02-25 ENCOUNTER — Telehealth (HOSPITAL_COMMUNITY): Payer: Self-pay | Admitting: Cardiology

## 2021-02-25 ENCOUNTER — Ambulatory Visit (HOSPITAL_COMMUNITY): Payer: PPO

## 2021-02-25 NOTE — Telephone Encounter (Signed)
Patient cancelled echocardiogram and does not wish to reschedule. She does not want the test. Order will be removed from the echo WQ. If patient was to call back to schedule we will reinstate the order. Thank you.

## 2021-03-05 ENCOUNTER — Other Ambulatory Visit (HOSPITAL_COMMUNITY): Payer: PPO

## 2021-05-05 ENCOUNTER — Telehealth: Payer: Self-pay | Admitting: Family Medicine

## 2021-05-05 NOTE — Telephone Encounter (Signed)
LVM for pt to rtn my call to r/s appt with NHA on 05/29/21

## 2021-05-19 DIAGNOSIS — L298 Other pruritus: Secondary | ICD-10-CM | POA: Diagnosis not present

## 2021-05-19 DIAGNOSIS — Z08 Encounter for follow-up examination after completed treatment for malignant neoplasm: Secondary | ICD-10-CM | POA: Diagnosis not present

## 2021-05-19 DIAGNOSIS — D485 Neoplasm of uncertain behavior of skin: Secondary | ICD-10-CM | POA: Diagnosis not present

## 2021-05-19 DIAGNOSIS — Z85828 Personal history of other malignant neoplasm of skin: Secondary | ICD-10-CM | POA: Diagnosis not present

## 2021-05-19 DIAGNOSIS — L918 Other hypertrophic disorders of the skin: Secondary | ICD-10-CM | POA: Diagnosis not present

## 2021-05-19 DIAGNOSIS — R229 Localized swelling, mass and lump, unspecified: Secondary | ICD-10-CM | POA: Diagnosis not present

## 2021-05-19 DIAGNOSIS — L820 Inflamed seborrheic keratosis: Secondary | ICD-10-CM | POA: Diagnosis not present

## 2021-05-19 DIAGNOSIS — L538 Other specified erythematous conditions: Secondary | ICD-10-CM | POA: Diagnosis not present

## 2021-05-19 DIAGNOSIS — R208 Other disturbances of skin sensation: Secondary | ICD-10-CM | POA: Diagnosis not present

## 2021-05-19 DIAGNOSIS — Z789 Other specified health status: Secondary | ICD-10-CM | POA: Diagnosis not present

## 2021-05-29 ENCOUNTER — Ambulatory Visit: Payer: PPO

## 2021-06-10 DIAGNOSIS — C441122 Basal cell carcinoma of skin of right lower eyelid, including canthus: Secondary | ICD-10-CM | POA: Diagnosis not present

## 2021-06-10 DIAGNOSIS — H02052 Trichiasis without entropian right lower eyelid: Secondary | ICD-10-CM | POA: Diagnosis not present

## 2021-06-10 DIAGNOSIS — H16211 Exposure keratoconjunctivitis, right eye: Secondary | ICD-10-CM | POA: Diagnosis not present

## 2021-06-14 ENCOUNTER — Other Ambulatory Visit: Payer: Self-pay | Admitting: Family Medicine

## 2021-06-14 DIAGNOSIS — I10 Essential (primary) hypertension: Secondary | ICD-10-CM

## 2021-06-14 DIAGNOSIS — R5383 Other fatigue: Secondary | ICD-10-CM

## 2021-06-17 ENCOUNTER — Other Ambulatory Visit: Payer: PPO

## 2021-06-21 NOTE — Progress Notes (Signed)
Subjective:   Amy Barron is a 80 y.o. female who presents for Medicare Annual (Subsequent) preventive examination.  I connected with Jadaya Sommerfield today by telephone and verified that I am speaking with the correct person using two identifiers. Location patient: home Location provider: work Persons participating in the virtual visit: patient, Marine scientist.    I discussed the limitations, risks, security and privacy concerns of performing an evaluation and management service by telephone and the availability of in person appointments. I also discussed with the patient that there may be a patient responsible charge related to this service. The patient expressed understanding and verbally consented to this telephonic visit.    Interactive audio and video telecommunications were attempted between this provider and patient, however failed, due to patient having technical difficulties OR patient did not have access to video capability.  We continued and completed visit with audio only.  Some vital signs may be absent or patient reported.   Time Spent with patient on telephone encounter: 20 minutes  Review of Systems     Cardiac Risk Factors include: advanced age (>70men, >71 women);hypertension;dyslipidemia     Objective:    Today's Vitals   06/24/21 0856  Weight: 156 lb (70.8 kg)  Height: 5\' 4"  (1.626 m)   Body mass index is 26.78 kg/m.  Advanced Directives 06/24/2021 05/28/2020 05/26/2018 05/25/2017 11/12/2016 08/18/2015 07/30/2015  Does Patient Have a Medical Advance Directive? Yes Yes Yes Yes No Yes Yes  Type of Paramedic of [place];Living will [place];Living will Living will;Healthcare Power of [place];Living will - [place];Living will Living will;Healthcare Power of [place];Out of facility DNR (pink MOST or yellow form)  Does patient want to make changes to medical advance directive?  Yes (MAU/Ambulatory/Procedural Areas - Information given) - - - - - -  Copy of [place] in Chart? - No - copy requested No - copy requested No - copy requested - - -  Pre-existing out of facility DNR order (yellow form or pink MOST form) - - - - - - -    Current Medications (verified) Outpatient Encounter Medications as of 06/24/2021  Medication Sig   amLODipine (NORVASC) 5 MG tablet Take 1 tablet (5 mg total) by mouth daily.   etodolac (LODINE) 400 MG tablet TAKE 1 TABLET (400 MG) BY MOUTH DAILY AS NEEDED WITH FOOD.   metoprolol tartrate (LOPRESSOR) 50 MG tablet Take 50 mg (1 tablet) TWO hours prior to CT scan   Multiple Vitamin (MULTIVITAMIN) tablet Take 1 tablet by mouth daily.   triamterene-hydrochlorothiazide (MAXZIDE-25) 37.5-25 MG tablet Take 1 tablet by mouth daily.   metroNIDAZOLE (METROGEL) 1 % gel Apply topically daily. (Patient not taking: Reported on 06/24/2021)   No facility-administered encounter medications on file as of 06/24/2021.    Allergies (verified) Phenergan [promethazine hcl] and Zofran [ondansetron hcl]   History: Past Medical History:  Diagnosis Date   Cataract 2018   corrected with surgery   History of renal stone    Hyperlipidemia    Hypertension    Past Surgical History:  Procedure Laterality Date   ABDOMINAL HYSTERECTOMY  1998   CATARACT EXTRACTION W/ INTRAOCULAR LENS  IMPLANT, BILATERAL  10/2016   COLONOSCOPY W/ BIOPSIES AND POLYPECTOMY     DILATION AND CURETTAGE OF UTERUS     EYELID CARCINOMA EXCISION Right    June 2022   MULTIPLE TOOTH EXTRACTIONS     ORIF HUMERUS FRACTURE Left 07/11/2015  Procedure: OPEN REDUCTION INTERNAL FIXATION (ORIF) PROXIMAL HUMERUS FRACTURE;  Surgeon: Marybelle Killings, MD;  Location: [facility];  Service: Orthopedics;  Laterality: Left;   ORIF WRIST FRACTURE  01/18/2012   Procedure: OPEN REDUCTION INTERNAL FIXATION (ORIF) WRIST FRACTURE;  Surgeon: Marybelle Killings, MD;  Location: [facility];  Service: Orthopedics;   Laterality: Left;   PARATHYROIDECTOMY     TUBAL LIGATION     Family History  Problem Relation Age of Onset   Cancer Father    Colon cancer Father 70   Diabetes Other    Breast cancer Maternal Aunt    Social History   Socioeconomic History   Marital status: Married    Spouse name: Not on file   Number of children: Not on file   Years of education: Not on file   Highest education level: Not on file  Occupational History   Not on file  Tobacco Use   Smoking status: Never   Smokeless tobacco: Never  Vaping Use   Vaping Use: Never used  Substance and Sexual Activity   Alcohol use: No   Drug use: No   Sexual activity: Never  Other Topics Concern   Not on file  Social History Narrative   Desires CPR.   Would not want prolonged life support if futile   She does have a Living Will.   Married 08/2017   Social Determinants of Health   Financial Resource Strain: Low Risk    Difficulty of Paying Living Expenses: Not hard at all  Food Insecurity: No Food Insecurity   Worried About Charity fundraiser in the Last Year: Never true   Arboriculturist in the Last Year: Never true  Transportation Needs: No Transportation Needs   Lack of Transportation (Medical): No   Lack of Transportation (Non-Medical): No  Physical Activity: Sufficiently Active   Days of Exercise per Week: 3 days   Minutes of Exercise per Session: 50 min  Stress: No Stress Concern Present   Feeling of Stress : Not at all  Social Connections: Socially Integrated   Frequency of Communication with Friends and Family: Twice a week   Frequency of Social Gatherings with Friends and Family: Twice a week   Attends Religious Services: More than 4 times per year   Active Member of [employer] or Organizations: Yes   Attends Music therapist: More than 4 times per year   Marital Status: Married    Tobacco Counseling Counseling given: Not Answered   Clinical Intake:  Pre-visit preparation completed:  Yes  Pain : No/denies pain     BMI - recorded: 27.24 Nutritional Status: BMI 25 -29 Overweight Nutritional Risks: None Diabetes: No  How often do you need to have someone help you when you read instructions, pamphlets, or other written materials from your doctor or pharmacy?: 1 - Never  Diabetic?No  Interpreter Needed?: No  Information entered by :: Orrin Brigham LPN   Activities of Daily Living In your present state of health, do you have any difficulty performing the following activities: 06/24/2021  Hearing? N  Vision? N  Difficulty concentrating or making decisions? N  Walking or climbing stairs? N  Dressing or bathing? N  Doing errands, shopping? N  Preparing Food and eating ? N  Using the Toilet? N  In the past six months, have you accidently leaked urine? N  Do you have problems with loss of bowel control? N  Managing your Medications? N  Managing your Finances?  N  Housekeeping or managing your Housekeeping? N  Some recent data might be hidden    Patient Care Team: Tonia Ghent, MD as PCP - General (Family Medicine)  Indicate any recent Medical Services you may have received from other than [facility] providers in the past year (date may be approximate).     Assessment:   This is a routine wellness examination for [place].  Hearing/Vision screen Hearing Screening - Comments:: No issues Vision Screening - Comments:: Last exam 10/2020, Dr. Pandora Leiter,   Dietary issues and exercise activities discussed: Current Exercise Habits: Structured exercise class, Type of exercise: yoga;walking, Time (Minutes): 45, Frequency (Times/Week): 3, Weekly Exercise (Minutes/Week): 135, Intensity: Moderate   Goals Addressed             This Visit's Progress    Patient Stated       Would like to maintain current health status       Depression Screen PHQ 2/9 Scores 06/24/2021 06/06/2020 05/28/2020 05/26/2018 05/25/2017 05/20/2016 04/08/2015  PHQ - 2 Score 0 0 0 0 0 0 0  PHQ-  9 Score - 0 0 0 0 - -    Fall Risk Fall Risk  06/24/2021 06/06/2020 05/28/2020 05/26/2018 05/25/2017  Falls in the past year? 0 0 0 0 No  Number falls in past yr: 0 0 0 - -  Injury with Fall? 0 0 0 - -  Comment - - - - -  Risk for fall due to : No Fall Risks - Medication side effect - -  Follow up Falls prevention discussed Falls evaluation completed Falls evaluation completed;Falls prevention discussed - -    FALL RISK PREVENTION PERTAINING TO THE HOME:  Any stairs in or around the home? Yes  If so, are there any without handrails? No  Home free of loose throw rugs in walkways, pet beds, electrical cords, etc? Yes  Adequate lighting in your home to reduce risk of falls? Yes   ASSISTIVE DEVICES UTILIZED TO PREVENT FALLS:  Life alert? No  Use of a cane, walker or w/c? No  Grab bars in the bathroom? Yes  Shower chair or bench in shower? Yes  Elevated toilet seat or a handicapped toilet? Yes   TIMED UP AND GO:  Was the test performed? No , visit completed over the phone.   Cognitive Function: Normal cognitive status assessed by this Nurse Health Advisor. No abnormalities found.   MMSE - Mini Mental State Exam 05/28/2020 05/26/2018 05/25/2017  Orientation to time 5 5 5   Orientation to Place 5 5 5   Registration 3 3 3   Attention/ Calculation 5 0 0  Recall 3 3 3   Language- name 2 objects - 0 0  Language- repeat 1 1 1   Language- follow 3 step command - 3 3  Language- read & follow direction - 0 0  Write a sentence - 0 0  Copy design - 0 0  Total score - 20 20        Immunizations Immunization History  Administered Date(s) Administered   Fluad Quad(high Dose 65+) 05/07/2019   Influenza Split 04/26/2012   Influenza,inj,Quad PF,6+ Mos 05/08/2013, 05/27/2017, 05/29/2018   Janssen (J&J) SARS-COV-2 Vaccination 10/03/2019   Pneumococcal Conjugate-13 04/12/2014   Pneumococcal Polysaccharide-23 04/20/2011    TDAP status: Due, Education has been provided regarding the  importance of this vaccine. Advised may receive this vaccine at local pharmacy or Health Dept. Aware to provide a copy of the vaccination record if obtained from local pharmacy or Health  Dept. Verbalized acceptance and understanding.  Flu Vaccine status: Declined, Education has been provided regarding the importance of this vaccine but patient still declined. Advised may receive this vaccine at local pharmacy or Health Dept. Aware to provide a copy of the vaccination record if obtained from local pharmacy or Health Dept. Verbalized acceptance and understanding.  Pneumococcal vaccine status: Up to date  Covid-19 vaccine status: Declined, Education has been provided regarding the importance of this vaccine but patient still declined. Advised may receive this vaccine at local pharmacy or Health Dept.or vaccine clinic. Aware to provide a copy of the vaccination record if obtained from local pharmacy or Health Dept. Verbalized acceptance and understanding.  Qualifies for Shingles Vaccine? Yes   Zostavax completed No   Shingrix Completed?: No.    Education has been provided regarding the importance of this vaccine. Patient has been advised to call insurance company to determine out of pocket expense if they have not yet received this vaccine. Advised may also receive vaccine at local pharmacy or Health Dept. Verbalized acceptance and understanding.  Screening Tests Health Maintenance  Topic Date Due   Hepatitis C Screening  Never done   Zoster Vaccines- Shingrix (1 of 2) Never done   COLONOSCOPY (Pts 45-41yrs Insurance coverage will need to be confirmed)  09/19/2017   MAMMOGRAM  06/13/2018   TETANUS/TDAP  04/20/2019   COVID-19 Vaccine (2 - Booster for Janssen series) 11/28/2019   INFLUENZA VACCINE  02/23/2021   Pneumonia Vaccine 20+ Years old  Completed   DEXA SCAN  Completed   HPV VACCINES  Aged Out    Health Maintenance  Health Maintenance Due  Topic Date Due   Hepatitis C Screening  Never  done   Zoster Vaccines- Shingrix (1 of 2) Never done   COLONOSCOPY (Pts 45-75yrs Insurance coverage will need to be confirmed)  09/19/2017   MAMMOGRAM  06/13/2018   TETANUS/TDAP  04/20/2019   COVID-19 Vaccine (2 - Booster for Janssen series) 11/28/2019   INFLUENZA VACCINE  02/23/2021    Colorectal cancer screening: No longer required.   Mammogram Status: due, last completed 06/13/17, patient declined today, will discuss with PCP if decided  Bone Density status: Ordered 06/24/21. Pt provided with contact info and advised to call to schedule appt.  Lung Cancer Screening: (Low Dose CT Chest recommended if Age 64-80 years, 30 pack-year currently smoking OR have quit w/in 15years.) does not qualify.     Additional Screening:  Hepatitis C Screening: does not qualify  Vision Screening: Recommended annual ophthalmology exams for early detection of glaucoma and other disorders of the eye. Is the patient up to date with their annual eye exam?  Yes  Who is the provider or what is the name of the office in which the patient attends annual eye exams? Dr. Pandora Leiter    Dental Screening: Recommended annual dental exams for proper oral hygiene  Community Resource Referral / Chronic Care Management: CRR required this visit?  No   CCM required this visit?  No      Plan:     I have personally reviewed and noted the following in the patient's chart:   Medical and social history Use of alcohol, tobacco or illicit drugs  Current medications and supplements including opioid prescriptions.  Functional ability and status Nutritional status Physical activity Advanced directives List of other physicians Hospitalizations, surgeries, and ER visits in previous 12 months Vitals Screenings to include cognitive, depression, and falls Referrals and appointments  In addition, I have reviewed  and discussed with patient certain preventive protocols, quality metrics, and best practice recommendations.  A written personalized care plan for preventive services as well as general preventive health recommendations were provided to patient.   Due to this being a telephonic visit, the after visit summary with patients personalized plan was offered to patient via mail or my-chart. Patient preferred to pick up at office at next visit.   Loma Messing, LPN   [id number]   Nurse Health Advisor  Nurse Notes: none

## 2021-06-24 ENCOUNTER — Ambulatory Visit (INDEPENDENT_AMBULATORY_CARE_PROVIDER_SITE_OTHER): Payer: PPO

## 2021-06-24 VITALS — Ht 64.0 in | Wt 156.0 lb

## 2021-06-24 DIAGNOSIS — Z0000 Encounter for general adult medical examination without abnormal findings: Secondary | ICD-10-CM

## 2021-06-24 DIAGNOSIS — Z780 Asymptomatic menopausal state: Secondary | ICD-10-CM | POA: Diagnosis not present

## 2021-06-24 NOTE — Patient Instructions (Signed)
Amy Barron , Thank you for taking time to complete for your Medicare Wellness Visit. I appreciate your ongoing commitment to your health goals. Please review the following plan we discussed and let me know if I can assist you in the future.   Screening recommendations/referrals: Colonoscopy: no longer required Mammogram: due, last exam 06/13/17, declined today, discuss with PCP if you change your mind Bone Density: due, last exam 10/25/16, ordered today someone will call to schedule Recommended yearly ophthalmology/optometry visit for glaucoma screening and checkup Recommended yearly dental visit for hygiene and checkup  Vaccinations: Influenza vaccine: Declined today, please call office or local pharmacy to schedule if you change your mind Pneumococcal vaccine: up to date Tdap vaccine: due, last completed 04/19/09, Discuss with your local pharmacy Shingles vaccine: Discuss with your local pharmacy if you change your mind Covid-19: newest booster available at your local pharmacy  Advanced directives: Please bring a copy of Living Will and/or [place] for your chart.   Conditions/risks identified: see problem list  Next appointment: Follow up in one year for your annual wellness visit    Preventive Care 65 Years and Older, Female Preventive care refers to lifestyle choices and visits with your health care provider that can promote health and wellness. What does preventive care include? A yearly physical exam. This is also called an annual well check. Dental exams once or twice a year. Routine eye exams. Ask your health care provider how often you should have your eyes checked. Personal lifestyle choices, including: Daily care of your teeth and gums. Regular physical activity. Eating a healthy diet. Avoiding tobacco and drug use. Limiting alcohol use. Practicing safe sex. Taking low-dose aspirin every day. Taking vitamin and mineral supplements as recommended  by your health care provider. What happens during an annual well check? The services and screenings done by your health care provider during your annual well check will depend on your age, overall health, lifestyle risk factors, and family history of disease. Counseling  Your health care provider may ask you questions about your: Alcohol use. Tobacco use. Drug use. Emotional well-being. Home and relationship well-being. Sexual activity. Eating habits. History of falls. Memory and ability to understand (cognition). Work and work Statistician. Reproductive health. Screening  You may have the following tests or measurements: Height, weight, and BMI. Blood pressure. Lipid and cholesterol levels. These may be checked every 5 years, or more frequently if you are over 79 years old. Skin check. Lung cancer screening. You may have this screening every year starting at age 79 if you have a 30-pack-year history of smoking and currently smoke or have quit within the past 15 years. Fecal occult blood test (FOBT) of the stool. You may have this test every year starting at age 79. Flexible sigmoidoscopy or colonoscopy. You may have a sigmoidoscopy every 5 years or a colonoscopy every 10 years starting at age 32. Hepatitis C blood test. Hepatitis B blood test. Sexually transmitted disease (STD) testing. Diabetes screening. This is done by checking your blood sugar (glucose) after you have not eaten for a while (fasting). You may have this done every 1-3 years. Bone density scan. This is done to screen for osteoporosis. You may have this done starting at age 79. Mammogram. This may be done every 1-2 years. Talk to your health care provider about how often you should have regular mammograms. Talk with your health care provider about your test results, treatment options, and if necessary, the need for more tests.  Vaccines  Your health care provider may recommend certain vaccines, such as: Influenza  vaccine. This is recommended every year. Tetanus, diphtheria, and acellular pertussis (Tdap, Td) vaccine. You may need a Td booster every 10 years. Zoster vaccine. You may need this after age 50. Pneumococcal 13-valent conjugate (PCV13) vaccine. One dose is recommended after age 79. Pneumococcal polysaccharide (PPSV23) vaccine. One dose is recommended after age 79. Talk to your health care provider about which screenings and vaccines you need and how often you need them. This information is not intended to replace advice given to you by your health care provider. Make sure you discuss any questions you have with your health care provider. Document Released: 08/08/2015 Document Revised: 03/31/2016 Document Reviewed: 05/13/2015 Elsevier Interactive Patient Education  2017 [place] Prevention in the Home Falls can cause injuries. They can happen to people of all ages. There are many things you can do to make your home safe and to help prevent falls. What can I do on the outside of my home? Regularly fix the edges of walkways and driveways and fix any cracks. Remove anything that might make you trip as you walk through a door, such as a raised step or threshold. Trim any bushes or trees on the path to your home. Use bright outdoor lighting. Clear any walking paths of anything that might make someone trip, such as rocks or tools. Regularly check to see if handrails are loose or broken. Make sure that both sides of any steps have handrails. Any raised decks and porches should have guardrails on the edges. Have any leaves, snow, or ice cleared regularly. Use sand or salt on walking paths during winter. Clean up any spills in your garage right away. This includes oil or grease spills. What can I do in the bathroom? Use night lights. Install grab bars by the toilet and in the tub and shower. Do not use towel bars as grab bars. Use non-skid mats or decals in the tub or shower. If you  need to sit down in the shower, use a plastic, non-slip stool. Keep the floor dry. Clean up any water that spills on the floor as soon as it happens. Remove soap buildup in the tub or shower regularly. Attach bath mats securely with double-sided non-slip rug tape. Do not have throw rugs and other things on the floor that can make you trip. What can I do in the bedroom? Use night lights. Make sure that you have a light by your bed that is easy to reach. Do not use any sheets or blankets that are too big for your bed. They should not hang down onto the floor. Have a firm chair that has side arms. You can use this for support while you get dressed. Do not have throw rugs and other things on the floor that can make you trip. What can I do in the kitchen? Clean up any spills right away. Avoid walking on wet floors. Keep items that you use a lot in easy-to-reach places. If you need to reach something above you, use a strong step stool that has a grab bar. Keep electrical cords out of the way. Do not use floor polish or wax that makes floors slippery. If you must use wax, use non-skid floor wax. Do not have throw rugs and other things on the floor that can make you trip. What can I do with my stairs? Do not leave any items on the stairs. Make sure that  there are handrails on both sides of the stairs and use them. Fix handrails that are broken or loose. Make sure that handrails are as long as the stairways. Check any carpeting to make sure that it is firmly attached to the stairs. Fix any carpet that is loose or worn. Avoid having throw rugs at the top or bottom of the stairs. If you do have throw rugs, attach them to the floor with carpet tape. Make sure that you have a light switch at the top of the stairs and the bottom of the stairs. If you do not have them, ask someone to add them for you. What else can I do to help prevent falls? Wear shoes that: Do not have high heels. Have rubber  bottoms. Are comfortable and fit you well. Are closed at the toe. Do not wear sandals. If you use a stepladder: Make sure that it is fully opened. Do not climb a closed stepladder. Make sure that both sides of the stepladder are locked into place. Ask someone to hold it for you, if possible. Clearly mark and make sure that you can see: Any grab bars or handrails. First and last steps. Where the edge of each step is. Use tools that help you move around (mobility aids) if they are needed. These include: Canes. Walkers. Scooters. Crutches. Turn on the lights when you go into a dark area. Replace any light bulbs as soon as they burn out. Set up your furniture so you have a clear path. Avoid moving your furniture around. If any of your floors are uneven, fix them. If there are any pets around you, be aware of where they are. Review your medicines with your doctor. Some medicines can make you feel dizzy. This can increase your chance of falling. Ask your doctor what other things that you can do to help prevent falls. This information is not intended to replace advice given to you by your health care provider. Make sure you discuss any questions you have with your health care provider. Document Released: 05/08/2009 Document Revised: 12/18/2015 Document Reviewed: 08/16/2014 Elsevier Interactive Patient Education  2017 [employer].

## 2021-06-25 ENCOUNTER — Other Ambulatory Visit: Payer: Self-pay

## 2021-06-25 ENCOUNTER — Encounter: Payer: Self-pay | Admitting: Family Medicine

## 2021-06-25 ENCOUNTER — Ambulatory Visit (INDEPENDENT_AMBULATORY_CARE_PROVIDER_SITE_OTHER): Payer: PPO | Admitting: Family Medicine

## 2021-06-25 VITALS — BP 124/82 | HR 71 | Temp 97.8°F | Ht 64.0 in | Wt 158.0 lb

## 2021-06-25 DIAGNOSIS — Z0000 Encounter for general adult medical examination without abnormal findings: Secondary | ICD-10-CM

## 2021-06-25 DIAGNOSIS — I10 Essential (primary) hypertension: Secondary | ICD-10-CM | POA: Diagnosis not present

## 2021-06-25 DIAGNOSIS — M8580 Other specified disorders of bone density and structure, unspecified site: Secondary | ICD-10-CM

## 2021-06-25 DIAGNOSIS — M79606 Pain in leg, unspecified: Secondary | ICD-10-CM

## 2021-06-25 DIAGNOSIS — M899 Disorder of bone, unspecified: Secondary | ICD-10-CM

## 2021-06-25 DIAGNOSIS — R5383 Other fatigue: Secondary | ICD-10-CM

## 2021-06-25 DIAGNOSIS — Z23 Encounter for immunization: Secondary | ICD-10-CM

## 2021-06-25 DIAGNOSIS — Z7189 Other specified counseling: Secondary | ICD-10-CM

## 2021-06-25 DIAGNOSIS — R0789 Other chest pain: Secondary | ICD-10-CM

## 2021-06-25 LAB — COMPREHENSIVE METABOLIC PANEL
ALT: 17 U/L (ref 0–35)
AST: 19 U/L (ref 0–37)
Albumin: 4.5 g/dL (ref 3.5–5.2)
Alkaline Phosphatase: 90 U/L (ref 39–117)
BUN: 18 mg/dL (ref 6–23)
CO2: 32 mEq/L (ref 19–32)
Calcium: 9.9 mg/dL (ref 8.4–10.5)
Chloride: 101 mEq/L (ref 96–112)
Creatinine, Ser: 0.86 mg/dL (ref 0.40–1.20)
GFR: 64.37 mL/min (ref >60.00)
Glucose, Bld: 101 mg/dL — ABNORMAL HIGH (ref 70–99)
Potassium: 4.4 mEq/L (ref 3.5–5.1)
Sodium: 140 mEq/L (ref 135–145)
Total Bilirubin: 0.7 mg/dL (ref 0.2–1.2)
Total Protein: 7 g/dL (ref 6.0–8.3)

## 2021-06-25 LAB — LIPID PANEL
Cholesterol: 259 mg/dL — ABNORMAL HIGH (ref 0–200)
HDL: 63.2 mg/dL (ref >39.00)
LDL Cholesterol: 157 mg/dL — ABNORMAL HIGH (ref 0–99)
NonHDL: 195.94
Total CHOL/HDL Ratio: 4
Triglycerides: 193 mg/dL — ABNORMAL HIGH (ref 0.0–149.0)
VLDL: 38.6 mg/dL (ref 0.0–40.0)

## 2021-06-25 LAB — VITAMIN D 25 HYDROXY (VIT D DEFICIENCY, FRACTURES): VITD: 30.02 ng/mL (ref 30.00–100.00)

## 2021-06-25 LAB — VITAMIN B12: Vitamin B-12: 260 pg/mL (ref 211–911)

## 2021-06-25 MED ORDER — TRIAMTERENE-HCTZ 37.5-25 MG PO TABS: 1.0000 | ORAL_TABLET | Freq: Every day | ORAL | 3 refills | Status: DC

## 2021-06-25 MED ORDER — ETODOLAC 400 MG PO TABS: ORAL_TABLET | ORAL | 3 refills | Status: DC

## 2021-06-25 MED ORDER — AMLODIPINE BESYLATE 5 MG PO TABS: 5.0000 mg | ORAL_TABLET | Freq: Every day | ORAL | 3 refills | Status: DC

## 2021-06-25 NOTE — Progress Notes (Signed)
This visit occurred during the SARS-CoV-2 public health emergency.  Safety protocols were in place, including screening questions prior to the visit, additional usage of staff PPE, and extensive cleaning of exam room while observing appropriate contact time as indicated for disinfecting solutions.  Hypertension:    Using medication without problems or lightheadedness: yes Chest pain with exertion:no Edema:no Short of breath:no She wanted to defer coronary CTA.  No CP in the meantime.    She had prev fatigue noted.  D/w pt about rechecking B12 level.  She improved with B12 tx prev, off med now.    Flu 2022- d/w pt about. She wanted to get the low dose vaccine d/w pt.   Shingles discussed with patient PNA up-to-date Tetanus discussed with patient Covid prev done.   Colon cancer screening declined by patient. Breast cancer screening declined by patient. Bone density test d/w pt.  pending as of 2022.  D/w pt about checking vit D level today.   Advance directive-husband designated if patient were incapacitated.  Facial rash improved with vaseline.  Metrogel didn't help.    She had BCC removed by derm, d/w pt.  Was on R lower eyelid, removed.  Normal blink function now.    She had R leg pain in May 2022.  It resolved.  Then again last week.  Along the R quad.  Resolved in the meantime.  Has used lodine in the meantime.  Nsaid cautions d/w pt.    Meds, vitals, and allergies reviewed.   PMH and SH reviewed  ROS: Per HPI unless specifically indicated in ROS section   GEN: nad, alert and oriented HEENT: ncat NECK: supple w/o LA CV: rrr. PULM: ctab, no inc wob ABD: soft, +bs EXT: no edema SKIN: well perfused.

## 2021-06-25 NOTE — Patient Instructions (Addendum)
Go to the lab on the way out.   If you have mychart we'll likely use that to update you.    Take care.  Glad to see you. Update me as needed.   Flu shot today.

## 2021-06-28 ENCOUNTER — Other Ambulatory Visit: Payer: Self-pay | Admitting: Family Medicine

## 2021-06-28 DIAGNOSIS — M79606 Pain in leg, unspecified: Secondary | ICD-10-CM | POA: Insufficient documentation

## 2021-06-28 MED ORDER — VITAMIN B-12 1000 MCG PO TABS: 1000.0000 ug | ORAL_TABLET | Freq: Every day | ORAL | Status: DC

## 2021-06-28 NOTE — Assessment & Plan Note (Signed)
Along the right quad, not radicular.  She is use Lodine in the past.  NSAID cautions discussed with patient.  It looks like she had a benign musculoskeletal strain, limited to the quad.  She can update me as needed.  No symptoms now.

## 2021-06-28 NOTE — Assessment & Plan Note (Signed)
Flu 2022- d/w pt about. She wanted to get the low dose vaccine d/w pt.   Shingles discussed with patient PNA up-to-date Tetanus discussed with patient Covid prev done.   Colon cancer screening declined by patient. Breast cancer screening declined by patient. Bone density test d/w pt.  pending as of 2022.  D/w pt about checking vit D level today.   Advance directive-husband designated if patient were incapacitated.

## 2021-06-28 NOTE — Assessment & Plan Note (Signed)
No recurrent chest pain in the meantime.  Discussed.  She wanted to defer CTA.  See notes on labs.

## 2021-06-28 NOTE — Assessment & Plan Note (Signed)
Advance directive- husband designated if patient were incapacitated.  

## 2021-06-28 NOTE — Assessment & Plan Note (Signed)
Discussed options.  Continue amlodipine and triamterene hydrochlorothiazide.  See notes on labs.  She wanted to defer CTA.  Discussed.  No chest pain.

## 2021-06-28 NOTE — Assessment & Plan Note (Signed)
  She had prev fatigue noted.  D/w pt about rechecking B12 level.  She improved with B12 tx prev, off med now.  See notes on labs.

## 2021-07-01 ENCOUNTER — Telehealth: Payer: Self-pay | Admitting: Family Medicine

## 2021-07-01 DIAGNOSIS — C4442 Squamous cell carcinoma of skin of scalp and neck: Secondary | ICD-10-CM | POA: Diagnosis not present

## 2021-07-01 HISTORY — PX: SKIN CANCER EXCISION: SHX779

## 2021-07-01 NOTE — Telephone Encounter (Signed)
Pt said that she called 12/6 4p for results and no one available. Pt calling again requesting call back. She said ok to leave detailed msg. She said if it's too much for a msg Dr. Damita Dunnings told her he would mail and highlight anything of importance.   Pt has appt 1p today for removing skin cancer patch from neck and not sure if she will be available.   Ph# [phone]

## 2021-07-02 ENCOUNTER — Emergency Department (HOSPITAL_BASED_OUTPATIENT_CLINIC_OR_DEPARTMENT_OTHER): Payer: PPO | Admitting: Radiology

## 2021-07-02 ENCOUNTER — Emergency Department (HOSPITAL_BASED_OUTPATIENT_CLINIC_OR_DEPARTMENT_OTHER): Payer: PPO

## 2021-07-02 ENCOUNTER — Other Ambulatory Visit: Payer: Self-pay

## 2021-07-02 ENCOUNTER — Encounter (HOSPITAL_BASED_OUTPATIENT_CLINIC_OR_DEPARTMENT_OTHER): Payer: Self-pay | Admitting: *Deleted

## 2021-07-02 ENCOUNTER — Emergency Department (HOSPITAL_BASED_OUTPATIENT_CLINIC_OR_DEPARTMENT_OTHER)
Admission: EM | Admit: 2021-07-02 | Discharge: 2021-07-02 | Disposition: A | Discharge status: Home / Self Care | Payer: PPO | Attending: Emergency Medicine | Admitting: Emergency Medicine

## 2021-07-02 DIAGNOSIS — W010XXA Fall on same level from slipping, tripping and stumbling without subsequent striking against object, initial encounter: Secondary | ICD-10-CM | POA: Insufficient documentation

## 2021-07-02 DIAGNOSIS — I10 Essential (primary) hypertension: Secondary | ICD-10-CM | POA: Diagnosis not present

## 2021-07-02 DIAGNOSIS — S42211A Unspecified displaced fracture of surgical neck of right humerus, initial encounter for closed fracture: Secondary | ICD-10-CM | POA: Insufficient documentation

## 2021-07-02 DIAGNOSIS — S42201A Unspecified fracture of upper end of right humerus, initial encounter for closed fracture: Secondary | ICD-10-CM | POA: Diagnosis not present

## 2021-07-02 DIAGNOSIS — S42291A Other displaced fracture of upper end of right humerus, initial encounter for closed fracture: Secondary | ICD-10-CM | POA: Diagnosis not present

## 2021-07-02 DIAGNOSIS — Z85828 Personal history of other malignant neoplasm of skin: Secondary | ICD-10-CM | POA: Insufficient documentation

## 2021-07-02 DIAGNOSIS — S59911A Unspecified injury of right forearm, initial encounter: Secondary | ICD-10-CM | POA: Diagnosis present

## 2021-07-02 DIAGNOSIS — Z79899 Other long term (current) drug therapy: Secondary | ICD-10-CM | POA: Diagnosis not present

## 2021-07-02 MED ORDER — IBUPROFEN 800 MG PO TABS
800.0000 mg | ORAL_TABLET | Freq: Once | ORAL | Status: AC
  Administered 2021-07-02: 800 mg via ORAL
  Filled 2021-07-02: qty 1

## 2021-07-02 MED ORDER — IBUPROFEN 800 MG PO TABS: 800.0000 mg | ORAL_TABLET | Freq: Three times a day (TID) | ORAL | 0 refills | Status: DC

## 2021-07-02 NOTE — Discharge Instructions (Addendum)
Take ibuprofen up to 3 times daily for pain.  Take it with food and water as this decreases the risk of getting ulcers.  Follow-up with orthopedic surgery, call them this afternoon to schedule follow-up for tomorrow or early next week.

## 2021-07-02 NOTE — ED Provider Notes (Signed)
[facility] EMERGENC[facility] DEPT Provider Note   CSN: [id number] Arrival date & time: 07/02/21  1222     History Chief Complaint  Patient presents with   Amy Barron is a 80 y.o. female.  HPI  Patient with noncontributory full medical history presents due to fall.  She was walking at [employer] when she had a mechanical fall and tripped forward landing on her left arm and hitting the bridge of her nose.  She denies losing consciousness, no nausea or vomiting.  Denies any headache or pain in the face.  No pain in the neck either.  She is not on any blood thinners.  She complains of pain in the right arm, pain in the right arm is alleviated by keeping it still and aggravated by moving it in any way.  Past Medical History:  Diagnosis Date   BCC (basal cell carcinoma of skin)    Cataract 2018   corrected with surgery   History of renal stone    Hyperlipidemia    Hypertension     Patient Active Problem List   Diagnosis Date Noted   Leg pain 06/28/2021   Atypical chest pain 02/12/2021   Viral syndrome 08/01/2020   Sore throat 08/01/2020   Other fatigue 06/08/2020   Facial rash 01/28/2020   Cough 07/30/2018   Postmenopausal 08/24/2017   Healthcare maintenance 05/29/2017   Advance care planning 05/29/2017   Joint ache 05/29/2017   Medicare annual wellness visit, subsequent 05/20/2016   Well woman exam 04/15/2016   GERD (gastroesophageal reflux disease) 08/14/2015   Allergic rhinitis 04/08/2015   HLD (hyperlipidemia) 09/30/2010   Essential hypertension 09/30/2010    Past Surgical History:  Procedure Laterality Date   ABDOMINAL HYSTERECTOMY  1998   CATARACT EXTRACTION W/ INTRAOCULAR LENS  IMPLANT, BILATERAL  10/2016   COLONOSCOPY W/ BIOPSIES AND POLYPECTOMY     DILATION AND CURETTAGE OF UTERUS     EYELID CARCINOMA EXCISION Right    June 2022   MULTIPLE TOOTH EXTRACTIONS     ORIF HUMERUS FRACTURE Left 07/11/2015   Procedure: OPEN REDUCTION  INTERNAL FIXATION (ORIF) PROXIMAL HUMERUS FRACTURE;  Surgeon: Marybelle Killings, MD;  Location: [facility];  Service: Orthopedics;  Laterality: Left;   ORIF WRIST FRACTURE  01/18/2012   Procedure: OPEN REDUCTION INTERNAL FIXATION (ORIF) WRIST FRACTURE;  Surgeon: Marybelle Killings, MD;  Location: [facility];  Service: Orthopedics;  Laterality: Left;   PARATHYROIDECTOMY     TUBAL LIGATION       OB History   No obstetric history on file.     Family History  Problem Relation Age of Onset   Cancer Father    Colon cancer Father 44   Diabetes Other    Breast cancer Maternal Aunt     Social History   Tobacco Use   Smoking status: Never   Smokeless tobacco: Never  Vaping Use   Vaping Use: Never used  Substance Use Topics   Alcohol use: No   Drug use: No    Home Medications Prior to Admission medications   Medication Sig Start Date End Date Taking? Authorizing Provider  amLODipine (NORVASC) 5 MG tablet Take 1 tablet (5 mg total) by mouth daily. 06/25/21   Tonia Ghent, MD  etodolac (LODINE) 400 MG tablet TAKE 1 TABLET (400 MG) BY MOUTH DAILY AS NEEDED WITH FOOD. 06/25/21   Tonia Ghent, MD  Multiple Vitamin (MULTIVITAMIN) tablet Take 1 tablet by mouth daily.  [provider]  triamterene-hydrochlorothiazide (MAXZIDE-25) 37.5-25 MG tablet Take 1 tablet by mouth daily. 06/25/21   Tonia Ghent, MD  vitamin B-12 (CYANOCOBALAMIN) 1000 MCG tablet Take 1 tablet (1,000 mcg total) by mouth daily. 06/28/21   Tonia Ghent, MD    Allergies    Phenergan [promethazine hcl] and Zofran Alvis Lemmings hcl]  Review of Systems   Review of Systems  Constitutional:  Negative for fatigue.  Respiratory:  Negative for shortness of breath.   Cardiovascular:  Negative for chest pain.  Gastrointestinal:  Negative for nausea and vomiting.  Musculoskeletal:  Positive for arthralgias. Negative for back pain and gait problem.  Skin:  Positive for wound.       Left arm abrasion  Neurological:  Negative  for dizziness, tremors, syncope and headaches.   Physical Exam Updated Vital Signs BP (!) 144/69 (BP Location: Left Arm)   Pulse 75   Temp 97.6 F (36.4 C)   Resp 16   Ht 5\' 4"  (1.626 m)   Wt 67.1 kg   SpO2 100%   BMI 25.40 kg/m   Physical Exam Vitals and nursing note reviewed.  Constitutional:      General: She is not in acute distress.    Appearance: She is well-developed.  HENT:     Head: Normocephalic and atraumatic.  Eyes:     Conjunctiva/sclera: Conjunctivae normal.  Neck:     Comments: No midline tenderness, no palpable step-offs. Cardiovascular:     Rate and Rhythm: Normal rate and regular rhythm.     Heart sounds: No murmur heard.    Comments: Radial pulse 2+ Pulmonary:     Effort: Pulmonary effort is normal. No respiratory distress.     Breath sounds: Normal breath sounds.  Abdominal:     Palpations: Abdomen is soft.     Tenderness: There is no abdominal tenderness.  Musculoskeletal:        General: Tenderness and signs of injury present. No swelling.     Cervical back: Normal range of motion and neck supple. No rigidity.     Comments: Tenderness to the right upper arm.  Decreased range of motion secondary to pain.  Skin:    General: Skin is warm and dry.     Capillary Refill: Capillary refill takes less than 2 seconds.     Comments: Abrasion to left arm.  Neurological:     Mental Status: She is alert.     Comments: Cranial nerves II through XII are grossly intact.  Grip strength is equal bilaterally.  Psychiatric:        Mood and Affect: Mood normal.    ED Results / Procedures / Treatments   Labs (all labs ordered are listed, but only abnormal results are displayed) Labs Reviewed - No data to display  EKG None  Radiology DG Humerus Right  Result Date: 07/02/2021 CLINICAL DATA:  Fall, deformity, pain EXAM: RIGHT HUMERUS - 2+ VIEW COMPARISON:  07/28/2018 chest x-ray FINDINGS: There is an acute minimally comminuted and displaced fracture of the  right proximal humerus surgical neck. Distal fragment appears impacted on a slightly rotated humeral head. Bones appear osteopenic. No associated subluxation or dislocation. AC joint aligned. IMPRESSION: Acute displaced comminuted and impacted fracture of the right proximal humerus surgical neck Electronically Signed   By: Jerilynn Mages.  Shick M.D.   On: 07/02/2021 13:24    Procedures Procedures   Medications Ordered in ED Medications - No data to display  ED Course  I have reviewed the  triage vital signs and the nursing notes.  Pertinent labs & imaging results that were available during my care of the patient were reviewed by me and considered in my medical decision making (see chart for details).    MDM Rules/Calculators/A&P                           Stable vitals, nontoxic-appearing.  No focal deficits on neuro exam, no tenderness to the face with palpation.  No signs of basilar skull fracture, no confusion or decreased mentation.  I discussed with the patient that I would prefer to get imaging of her head and face, patient declines.  She states she feels fine and does not wish for additional imaging.  Advise it is my recommendation to go ahead with the scan, we engaged in shared decision-making and patient still declines but agrees to return if she has any changes in mentation, vomiting, vision changes, confusion, facial pain, neck pain, jaw pain.  Radiograph is notable for proximal humerus head fracture.  Will sling and swathe.  She is neurovascularly intact, not on any blood thinners.  We will have her follow-up with orthopedics.   Final Clinical Impression(s) / ED Diagnoses Final diagnoses:  None    Rx / DC Orders ED Discharge Orders     None        Sherrill Raring, Hershal Coria 07/02/21 2033    Elnora Morrison, MD 07/03/21 [phone]

## 2021-07-02 NOTE — ED Triage Notes (Signed)
Pt tripped and fell at [facility], injuries to lip, nose and rt arm, Pt did not have LOC.

## 2021-07-02 NOTE — Telephone Encounter (Signed)
Called and spoke with patient about lab results.  

## 2021-07-02 NOTE — Telephone Encounter (Signed)
LMTCB

## 2021-07-02 NOTE — ED Notes (Signed)
Pt has small left arm skin tear. Wound was cleaned and covered with bandage.

## 2021-07-03 ENCOUNTER — Encounter (HOSPITAL_COMMUNITY): Payer: Self-pay | Admitting: Orthopaedic Surgery

## 2021-07-03 DIAGNOSIS — M25511 Pain in right shoulder: Secondary | ICD-10-CM | POA: Diagnosis not present

## 2021-07-03 NOTE — Progress Notes (Signed)
DUE TO COVID-19 ONLY ONE VISITOR IS ALLOWED TO COME WITH YOU AND STAY IN THE WAITING ROOM ONLY DURING PRE OP AND PROCEDURE DAY OF SURGERY.   PCP - Dr Elsie Stain Cardiologist - n/a  Chest x-ray - n/a EKG - 02/09/21 Stress Test - n/a ECHO - 02/16/21 Cardiac Cath - n/a  ICD Pacemaker/Loop - n/a  Sleep Study -  n/a CPAP - none  STOP now taking any Aspirin (unless otherwise instructed by your surgeon), Aleve, Naproxen, Ibuprofen, Motrin, Advil, Goody's, BC's, all herbal medications, fish oil, and all vitamins.   Coronavirus Screening Covid test n/a Ambulatory Surgery  Do you have any of the following symptoms:  Cough yes/no: No Fever (>100.53F)  yes/no: No Runny nose yes/no: No Sore throat yes/no: No Difficulty breathing/shortness of breath  yes/no: No  Have you traveled in the last 14 days and where? yes/no: No  Patient and husband Amy Barron verbalized understanding of instructions that were given via phone.

## 2021-07-05 ENCOUNTER — Ambulatory Visit (HOSPITAL_COMMUNITY): Payer: PPO

## 2021-07-05 ENCOUNTER — Ambulatory Visit (HOSPITAL_COMMUNITY): Payer: PPO | Admitting: Critical Care Medicine

## 2021-07-05 ENCOUNTER — Encounter (HOSPITAL_COMMUNITY): Payer: Self-pay | Admitting: Orthopaedic Surgery

## 2021-07-05 ENCOUNTER — Ambulatory Visit (HOSPITAL_COMMUNITY)
Admission: RE | Admit: 2021-07-05 | Discharge: 2021-07-05 | Disposition: A | Discharge status: Home / Self Care | Payer: PPO | Source: Ambulatory Visit | Attending: Orthopaedic Surgery | Admitting: Orthopaedic Surgery

## 2021-07-05 ENCOUNTER — Encounter (HOSPITAL_COMMUNITY)
Admission: RE | Disposition: A | Discharge status: Home / Self Care | Payer: Self-pay | Source: Ambulatory Visit | Attending: Orthopaedic Surgery

## 2021-07-05 DIAGNOSIS — G8918 Other acute postprocedural pain: Secondary | ICD-10-CM | POA: Diagnosis not present

## 2021-07-05 DIAGNOSIS — I10 Essential (primary) hypertension: Secondary | ICD-10-CM | POA: Insufficient documentation

## 2021-07-05 DIAGNOSIS — Z96611 Presence of right artificial shoulder joint: Secondary | ICD-10-CM | POA: Diagnosis not present

## 2021-07-05 DIAGNOSIS — W19XXXA Unspecified fall, initial encounter: Secondary | ICD-10-CM | POA: Diagnosis not present

## 2021-07-05 DIAGNOSIS — Z471 Aftercare following joint replacement surgery: Secondary | ICD-10-CM | POA: Diagnosis not present

## 2021-07-05 DIAGNOSIS — S42201A Unspecified fracture of upper end of right humerus, initial encounter for closed fracture: Secondary | ICD-10-CM | POA: Insufficient documentation

## 2021-07-05 DIAGNOSIS — K219 Gastro-esophageal reflux disease without esophagitis: Secondary | ICD-10-CM | POA: Insufficient documentation

## 2021-07-05 DIAGNOSIS — Z09 Encounter for follow-up examination after completed treatment for conditions other than malignant neoplasm: Secondary | ICD-10-CM

## 2021-07-05 DIAGNOSIS — S42251A Displaced fracture of greater tuberosity of right humerus, initial encounter for closed fracture: Secondary | ICD-10-CM | POA: Diagnosis not present

## 2021-07-05 HISTORY — PX: REVERSE SHOULDER ARTHROPLASTY: SHX5054

## 2021-07-05 LAB — CBC
HCT: 37.5 % (ref 36.0–46.0)
Hemoglobin: 12.3 g/dL (ref 12.0–15.0)
MCH: 31.9 pg (ref 26.0–34.0)
MCHC: 32.8 g/dL (ref 30.0–36.0)
MCV: 97.4 fL (ref 80.0–100.0)
Platelets: 269 10*3/uL (ref 150–400)
RBC: 3.85 MIL/uL — ABNORMAL LOW (ref 3.87–5.11)
RDW: 13.9 % (ref 11.5–15.5)
WBC: 11 10*3/uL — ABNORMAL HIGH (ref 4.0–10.5)
nRBC: 0 % (ref 0.0–0.2)

## 2021-07-05 LAB — SURGICAL PCR SCREEN
MRSA, PCR: NEGATIVE
Staphylococcus aureus: NEGATIVE

## 2021-07-05 SURGERY — ARTHROPLASTY, SHOULDER, TOTAL, REVERSE
Anesthesia: General | Site: Shoulder | Laterality: Right

## 2021-07-05 MED ORDER — TRANEXAMIC ACID-NACL 1000-0.7 MG/100ML-% IV SOLN
INTRAVENOUS | Status: AC
  Filled 2021-07-05: qty 100

## 2021-07-05 MED ORDER — ACETAMINOPHEN 500 MG PO TABS: 1000.0000 mg | ORAL_TABLET | Freq: Three times a day (TID) | ORAL | 0 refills | Status: AC

## 2021-07-05 MED ORDER — PHENYLEPHRINE 40 MCG/ML (10ML) SYRINGE FOR IV PUSH (FOR BLOOD PRESSURE SUPPORT)
PREFILLED_SYRINGE | INTRAVENOUS | Status: DC | PRN
  Administered 2021-07-05 (×2): 40 ug via INTRAVENOUS

## 2021-07-05 MED ORDER — ROCURONIUM BROMIDE 10 MG/ML (PF) SYRINGE
PREFILLED_SYRINGE | INTRAVENOUS | Status: DC | PRN
  Administered 2021-07-05: 50 mg via INTRAVENOUS

## 2021-07-05 MED ORDER — CHLORHEXIDINE GLUCONATE 0.12 % MT SOLN
OROMUCOSAL | Status: AC
  Administered 2021-07-05: 15 mL via OROMUCOSAL
  Filled 2021-07-05: qty 15

## 2021-07-05 MED ORDER — VANCOMYCIN HCL 1000 MG IV SOLR
INTRAVENOUS | Status: DC | PRN
  Administered 2021-07-05: 1000 mg

## 2021-07-05 MED ORDER — PROPOFOL 10 MG/ML IV BOLUS
INTRAVENOUS | Status: DC | PRN
  Administered 2021-07-05: 20 mg via INTRAVENOUS
  Administered 2021-07-05: 80 mg via INTRAVENOUS

## 2021-07-05 MED ORDER — DEXAMETHASONE SODIUM PHOSPHATE 10 MG/ML IJ SOLN
INTRAMUSCULAR | Status: AC
  Filled 2021-07-05: qty 1

## 2021-07-05 MED ORDER — ACETAMINOPHEN 500 MG PO TABS: 1000.0000 mg | ORAL_TABLET | Freq: Once | ORAL | Status: AC

## 2021-07-05 MED ORDER — ONDANSETRON HCL 4 MG/2ML IJ SOLN
INTRAMUSCULAR | Status: AC
  Filled 2021-07-05: qty 2

## 2021-07-05 MED ORDER — ORAL CARE MOUTH RINSE: 15.0000 mL | Freq: Once | OROMUCOSAL | Status: AC

## 2021-07-05 MED ORDER — VANCOMYCIN HCL 1000 MG IV SOLR
INTRAVENOUS | Status: AC
  Filled 2021-07-05: qty 20

## 2021-07-05 MED ORDER — LIDOCAINE 2% (20 MG/ML) 5 ML SYRINGE
INTRAMUSCULAR | Status: DC | PRN
  Administered 2021-07-05: 40 mg via INTRAVENOUS

## 2021-07-05 MED ORDER — FENTANYL CITRATE (PF) 100 MCG/2ML IJ SOLN: 50.0000 ug | Freq: Once | INTRAMUSCULAR | Status: AC

## 2021-07-05 MED ORDER — ONDANSETRON HCL 4 MG/2ML IJ SOLN
INTRAMUSCULAR | Status: DC | PRN
  Administered 2021-07-05: 4 mg via INTRAVENOUS

## 2021-07-05 MED ORDER — CEFAZOLIN SODIUM-DEXTROSE 2-4 GM/100ML-% IV SOLN
2.0000 g | INTRAVENOUS | Status: AC
  Administered 2021-07-05: 2 g via INTRAVENOUS

## 2021-07-05 MED ORDER — FENTANYL CITRATE (PF) 250 MCG/5ML IJ SOLN
INTRAMUSCULAR | Status: AC
  Filled 2021-07-05: qty 5

## 2021-07-05 MED ORDER — ASPIRIN 81 MG PO CHEW: 81.0000 mg | CHEWABLE_TABLET | Freq: Two times a day (BID) | ORAL | 0 refills | Status: AC

## 2021-07-05 MED ORDER — BUPIVACAINE-EPINEPHRINE (PF) 0.5% -1:200000 IJ SOLN: INTRAMUSCULAR | Status: DC | PRN

## 2021-07-05 MED ORDER — FENTANYL CITRATE (PF) 100 MCG/2ML IJ SOLN
INTRAMUSCULAR | Status: AC
  Administered 2021-07-05: 50 ug via INTRAVENOUS
  Filled 2021-07-05: qty 2

## 2021-07-05 MED ORDER — SUGAMMADEX SODIUM 200 MG/2ML IV SOLN
INTRAVENOUS | Status: DC | PRN
  Administered 2021-07-05: 140 mg via INTRAVENOUS

## 2021-07-05 MED ORDER — FENTANYL CITRATE (PF) 100 MCG/2ML IJ SOLN: 25.0000 ug | INTRAMUSCULAR | Status: DC | PRN

## 2021-07-05 MED ORDER — LACTATED RINGERS IV SOLN: INTRAVENOUS | Status: DC

## 2021-07-05 MED ORDER — PROPOFOL 500 MG/50ML IV EMUL
INTRAVENOUS | Status: DC | PRN
  Administered 2021-07-05: 100 ug/kg/min via INTRAVENOUS

## 2021-07-05 MED ORDER — GABAPENTIN 300 MG PO CAPS: 300.0000 mg | ORAL_CAPSULE | Freq: Once | ORAL | Status: AC

## 2021-07-05 MED ORDER — BUPIVACAINE HCL (PF) 0.25 % IJ SOLN
INTRAMUSCULAR | Status: AC
  Filled 2021-07-05: qty 30

## 2021-07-05 MED ORDER — SODIUM CHLORIDE 0.9 % IR SOLN
Status: DC | PRN
  Administered 2021-07-05: 1000 mL

## 2021-07-05 MED ORDER — DEXAMETHASONE SODIUM PHOSPHATE 10 MG/ML IJ SOLN
INTRAMUSCULAR | Status: DC | PRN
  Administered 2021-07-05: 10 mg via INTRAVENOUS

## 2021-07-05 MED ORDER — ACETAMINOPHEN 500 MG PO TABS
ORAL_TABLET | ORAL | Status: AC
  Administered 2021-07-05: 1000 mg via ORAL
  Filled 2021-07-05: qty 2

## 2021-07-05 MED ORDER — GABAPENTIN 300 MG PO CAPS
ORAL_CAPSULE | ORAL | Status: AC
  Administered 2021-07-05: 300 mg via ORAL
  Filled 2021-07-05: qty 1

## 2021-07-05 MED ORDER — PHENYLEPHRINE 40 MCG/ML (10ML) SYRINGE FOR IV PUSH (FOR BLOOD PRESSURE SUPPORT)
PREFILLED_SYRINGE | INTRAVENOUS | Status: AC
  Filled 2021-07-05: qty 10

## 2021-07-05 MED ORDER — OXYCODONE HCL 5 MG PO TABS: ORAL_TABLET | ORAL | 0 refills | Status: AC

## 2021-07-05 MED ORDER — PHENYLEPHRINE HCL-NACL 20-0.9 MG/250ML-% IV SOLN
INTRAVENOUS | Status: DC | PRN
  Administered 2021-07-05: 25 ug/min via INTRAVENOUS

## 2021-07-05 MED ORDER — CEFAZOLIN SODIUM-DEXTROSE 2-4 GM/100ML-% IV SOLN
INTRAVENOUS | Status: AC
  Filled 2021-07-05: qty 100

## 2021-07-05 MED ORDER — ROCURONIUM BROMIDE 10 MG/ML (PF) SYRINGE
PREFILLED_SYRINGE | INTRAVENOUS | Status: AC
  Filled 2021-07-05: qty 10

## 2021-07-05 MED ORDER — 0.9 % SODIUM CHLORIDE (POUR BTL) OPTIME
TOPICAL | Status: DC | PRN
  Administered 2021-07-05: 1000 mL

## 2021-07-05 MED ORDER — BUPIVACAINE LIPOSOME 1.3 % IJ SUSP
INTRAMUSCULAR | Status: DC | PRN
  Administered 2021-07-05: 10 mL via PERINEURAL

## 2021-07-05 MED ORDER — TRANEXAMIC ACID-NACL 1000-0.7 MG/100ML-% IV SOLN
1000.0000 mg | INTRAVENOUS | Status: AC
  Administered 2021-07-05: 1000 mg via INTRAVENOUS

## 2021-07-05 MED ORDER — OMEPRAZOLE 20 MG PO CPDR: 20.0000 mg | DELAYED_RELEASE_CAPSULE | Freq: Every day | ORAL | 0 refills | Status: AC

## 2021-07-05 MED ORDER — LIDOCAINE 2% (20 MG/ML) 5 ML SYRINGE
INTRAMUSCULAR | Status: AC
  Filled 2021-07-05: qty 5

## 2021-07-05 MED ORDER — CHLORHEXIDINE GLUCONATE 0.12 % MT SOLN: 15.0000 mL | Freq: Once | OROMUCOSAL | Status: AC

## 2021-07-05 MED ORDER — METOCLOPRAMIDE HCL 5 MG PO TABS: 5.0000 mg | ORAL_TABLET | Freq: Three times a day (TID) | ORAL | 0 refills | Status: DC | PRN

## 2021-07-05 MED ORDER — BUPIVACAINE-EPINEPHRINE (PF) 0.5% -1:200000 IJ SOLN
INTRAMUSCULAR | Status: DC | PRN
  Administered 2021-07-05: 15 mL via PERINEURAL

## 2021-07-05 SURGICAL SUPPLY — 67 items
BAG COUNTER SPONGE SURGICOUNT (BAG) ×2 IMPLANT
BASEPLATE GLENOSPHERE 25 STD (Miscellaneous) ×1 IMPLANT
BLADE SAW SAG 73X25 THK (BLADE) ×1
BLADE SAW SGTL 73X25 THK (BLADE) ×1 IMPLANT
CHLORAPREP W/TINT 26 (MISCELLANEOUS) ×5 IMPLANT
CLSR STERI-STRIP ANTIMIC 1/2X4 (GAUZE/BANDAGES/DRESSINGS) ×1 IMPLANT
COVER SURGICAL LIGHT HANDLE (MISCELLANEOUS) ×2 IMPLANT
DRAPE C-ARM 42X72 X-RAY (DRAPES) IMPLANT
DRAPE HALF SHEET 40X57 (DRAPES) ×2 IMPLANT
DRAPE INCISE IOBAN 66X45 STRL (DRAPES) ×4 IMPLANT
DRAPE ORTHO SPLIT 77X108 STRL (DRAPES) ×4
DRAPE SURG ORHT 6 SPLT 77X108 (DRAPES) ×2 IMPLANT
DRAPE SWITCH (DRAPES) ×2 IMPLANT
DRAPE U-SHAPE 47X51 STRL (DRAPES) IMPLANT
DRSG AQUACEL AG ADV 3.5X 6 (GAUZE/BANDAGES/DRESSINGS) ×2 IMPLANT
ELECT REM PT RETURN 9FT ADLT (ELECTROSURGICAL) ×2
ELECTRODE REM PT RTRN 9FT ADLT (ELECTROSURGICAL) ×1 IMPLANT
GLENOSPHERE REV SHOULDER 36 (Joint) ×1 IMPLANT
GLOVE SRG 8 PF TXTR STRL LF DI (GLOVE) ×1 IMPLANT
GLOVE SURG ENC MOIS LTX SZ6.5 (GLOVE) ×2 IMPLANT
GLOVE SURG LTX SZ8 (GLOVE) ×4 IMPLANT
GLOVE SURG POLYISO LF SZ6.5 (GLOVE) ×4 IMPLANT
GLOVE SURG UNDER LTX SZ6.5 (GLOVE) ×4 IMPLANT
GLOVE SURG UNDER POLY LF SZ8 (GLOVE) ×2
GOWN STRL REUS W/ TWL LRG LVL3 (GOWN DISPOSABLE) ×1 IMPLANT
GOWN STRL REUS W/ TWL XL LVL3 (GOWN DISPOSABLE) IMPLANT
GOWN STRL REUS W/TWL LRG LVL3 (GOWN DISPOSABLE) ×2
GOWN STRL REUS W/TWL XL LVL3 (GOWN DISPOSABLE) ×2
GUIDEWIRE GLENOID 2.5X220 (WIRE) ×1 IMPLANT
HANDPIECE INTERPULSE COAX TIP (DISPOSABLE) ×4
INSERT HUMERAL 36X6MM 12.5DEG (Insert) ×1 IMPLANT
KIT BASIN OR (CUSTOM PROCEDURE TRAY) ×2 IMPLANT
KIT STABILIZATION SHOULDER (MISCELLANEOUS) ×2 IMPLANT
KIT TURNOVER KIT B (KITS) ×2 IMPLANT
MANIFOLD NEPTUNE II (INSTRUMENTS) ×2 IMPLANT
NDL HYPO 25GX1X1/2 BEV (NEEDLE) IMPLANT
NDL MAYO TROCAR (NEEDLE) IMPLANT
NEEDLE HYPO 25GX1X1/2 BEV (NEEDLE) IMPLANT
NEEDLE MAYO TROCAR (NEEDLE) IMPLANT
NS IRRIG 1000ML POUR BTL (IV SOLUTION) ×2 IMPLANT
PACK SHOULDER (CUSTOM PROCEDURE TRAY) ×2 IMPLANT
PAD ARMBOARD 7.5X6 YLW CONV (MISCELLANEOUS) ×4 IMPLANT
RESTRAINT HEAD UNIVERSAL NS (MISCELLANEOUS) ×2 IMPLANT
SCREW 5.5X22 (Screw) ×1 IMPLANT
SCREW BONE 6.5 OD 30 NON BIO (Screw) ×1 IMPLANT
SCREW PERIPHERAL 5.0X34 (Screw) ×1 IMPLANT
SET HNDPC FAN SPRY TIP SCT (DISPOSABLE) ×1 IMPLANT
SLING ARM IMMOBILIZER LRG (SOFTGOODS) ×2 IMPLANT
SLING ULTRA II M (MISCELLANEOUS) ×1 IMPLANT
SPONGE T-LAP 18X18 ~~LOC~~+RFID (SPONGE) ×2 IMPLANT
STEM HUMERAL 3B LONG 98 (Stem) IMPLANT
STEM HUMERAL SZ 3B LONG 98MM (Stem) ×2 IMPLANT
STRIP CLOSURE SKIN 1/2X4 (GAUZE/BANDAGES/DRESSINGS) ×2 IMPLANT
SUCTION FRAZIER HANDLE 10FR (MISCELLANEOUS)
SUCTION TUBE FRAZIER 10FR DISP (MISCELLANEOUS) IMPLANT
SUT ETHIBOND 2 V 37 (SUTURE) ×2 IMPLANT
SUT ETHIBOND NAB CT1 #1 30IN (SUTURE) ×2 IMPLANT
SUT FIBERWIRE #5 38 CONV NDL (SUTURE) ×8
SUT MNCRL AB 3-0 PS2 18 (SUTURE) ×2 IMPLANT
SUT VIC AB 2-0 CT1 27 (SUTURE) ×2
SUT VIC AB 2-0 CT1 TAPERPNT 27 (SUTURE) ×1 IMPLANT
SUTURE FIBERWR #5 38 CONV NDL (SUTURE) ×4 IMPLANT
TOWEL GREEN STERILE (TOWEL DISPOSABLE) ×2 IMPLANT
TRAY FOLEY W/BAG SLVR 14FR (SET/KITS/TRAYS/PACK) IMPLANT
TRAY SHOULDER REV OFFSET 1.5 (Joint) ×1 IMPLANT
TUBE SUCTION HIGH CAP CLEAR NV (SUCTIONS) ×1 IMPLANT
WATER STERILE IRR 1000ML POUR (IV SOLUTION) ×2 IMPLANT

## 2021-07-05 NOTE — Anesthesia Postprocedure Evaluation (Signed)
Anesthesia Post Note  Patient: Amy Barron  Procedure(s) Performed: REVERSE SHOULDER ARTHROPLASTY (Right: Shoulder)     Patient location during evaluation: PACU Anesthesia Type: General and Regional Level of consciousness: awake and alert Pain management: pain level controlled Vital Signs Assessment: post-procedure vital signs reviewed and stable Respiratory status: spontaneous breathing, nonlabored ventilation and respiratory function stable Cardiovascular status: blood pressure returned to baseline and stable Postop Assessment: no apparent nausea or vomiting Anesthetic complications: no   No notable events documented.  Last Vitals:  Vitals:   07/05/21 1125 07/05/21 1140  BP: (!) 114/52 (!) 124/54  Pulse: (!) 59 67  Resp: 17 18  Temp:  (!) 36.3 C  SpO2: 93% 93%    Last Pain:  Vitals:   07/05/21 1125  TempSrc:   PainSc: 0-No pain                 Geniece Akers,W. EDMOND

## 2021-07-05 NOTE — Progress Notes (Signed)
Orthopedic Tech Progress Note Patient Details:  Amy Barron 12-12-1940 [id number]  Shoulder abduction pillow dropped off at OR desk.  Ortho Devices Type of Ortho Device: Shoulder abduction pillow Ortho Device/Splint Location: dropped off at OR, for RUE Ortho Device/Splint Interventions: Ordered      [name] 07/05/2021, 11:05 AM

## 2021-07-05 NOTE — Anesthesia Procedure Notes (Signed)
Anesthesia Regional Block: Interscalene brachial plexus block   Pre-Anesthetic Checklist: , timeout performed,  Correct Patient, Correct Site, Correct Laterality,  Correct Procedure, Correct Position, site marked,  Risks and benefits discussed,  Pre-op evaluation,  At surgeon's request and post-op pain management  Laterality: Right  Prep: Maximum Sterile Barrier Precautions used, chloraprep       Needles:  Injection technique: Single-shot  Needle Type: Echogenic Stimulator Needle     Needle Length: 5cm  Needle Gauge: 22     Additional Needles:   Procedures:,,,, ultrasound used (permanent image in chart),,    Narrative:  Start time: 07/05/2021 8:54 AM End time: 07/05/2021 9:04 AM Injection made incrementally with aspirations every 5 mL.  Performed by: Personally  Anesthesiologist: Roderic Palau, MD

## 2021-07-05 NOTE — Anesthesia Preprocedure Evaluation (Addendum)
Anesthesia Evaluation  Patient identified by MRN, date of birth, ID band Patient awake    Reviewed: Allergy & Precautions, H&P , NPO status , Patient's Chart, lab work & pertinent test results  Airway Mallampati: II  TM Distance: >3 FB Neck ROM: Full    Dental no notable dental hx. (+) Teeth Intact, Dental Advisory Given   Pulmonary neg pulmonary ROS,    Pulmonary exam normal breath sounds clear to auscultation       Cardiovascular hypertension, Pt. on medications  Rhythm:Regular Rate:Normal     Neuro/Psych negative neurological ROS  negative psych ROS   GI/Hepatic Neg liver ROS, GERD  ,  Endo/Other  negative endocrine ROS  Renal/GU negative Renal ROS  negative genitourinary   Musculoskeletal   Abdominal   Peds  Hematology negative hematology ROS (+)   Anesthesia Other Findings   Reproductive/Obstetrics negative OB ROS                            Anesthesia Physical Anesthesia Plan  ASA: 2  Anesthesia Plan: General and Regional   Post-op Pain Management: Regional block   Induction: Intravenous  PONV Risk Score and Plan: 4 or greater and Ondansetron, Dexamethasone, Treatment may vary due to age or medical condition, Propofol infusion and TIVA  Airway Management Planned: Oral ETT  Additional Equipment:   Intra-op Plan:   Post-operative Plan: Extubation in OR  Informed Consent: I have reviewed the patients History and Physical, chart, labs and discussed the procedure including the risks, benefits and alternatives for the proposed anesthesia with the patient or authorized representative who has indicated his/her understanding and acceptance.     Dental advisory given  Plan Discussed with: CRNA  Anesthesia Plan Comments:        Anesthesia Quick Evaluation

## 2021-07-05 NOTE — Transfer of Care (Signed)
Immediate Anesthesia Transfer of Care Note  Patient: NAKEESHA BOWLER  Procedure(s) Performed: REVERSE SHOULDER ARTHROPLASTY (Right: Shoulder)  Patient Location: PACU  Anesthesia Type:General  Level of Consciousness: drowsy  Airway & Oxygen Therapy: Patient Spontanous Breathing and Patient connected to nasal cannula oxygen  Post-op Assessment: Report given to RN and Post -op Vital signs reviewed and stable  Post vital signs: Reviewed and stable  Last Vitals:  Vitals Value Taken Time  BP 102/50   Temp    Pulse 62 07/05/21 1055  Resp 9 07/05/21 1055  SpO2 100 % 07/05/21 1055  Vitals shown include unvalidated device data.  Last Pain:  Vitals:   07/05/21 0809  TempSrc: Oral  PainSc: 9          Complications: No notable events documented.

## 2021-07-05 NOTE — Anesthesia Procedure Notes (Signed)
Procedure Name: Intubation Date/Time: 07/05/2021 9:25 AM Performed by: Wilburn Cornelia, CRNA Pre-anesthesia Checklist: Patient identified, Emergency Drugs available, Suction available, Patient being monitored and Timeout performed Patient Re-evaluated:Patient Re-evaluated prior to induction Oxygen Delivery Method: Circle system utilized Induction Type: IV induction Ventilation: Mask ventilation without difficulty Laryngoscope Size: 3 and Mac Grade View: Grade I Tube type: Oral Tube size: 7.0 mm Number of attempts: 1 Airway Equipment and Method: Stylet Placement Confirmation: ETT inserted through vocal cords under direct vision, positive ETCO2, CO2 detector and breath sounds checked- equal and bilateral Secured at: 21 cm Tube secured with: Tape Dental Injury: Teeth and Oropharynx as per pre-operative assessment

## 2021-07-05 NOTE — Discharge Instructions (Signed)
Ophelia Charter MD, MPH Noemi Chapel, PA-C [place] [address] [phone] (tel)   [phone] (fax)   [place] may leave the operative dressing in place until your follow-up appointment. KEEP THE INCISIONS CLEAN AND DRY. There may be a small amount of fluid/bleeding leaking at the surgical site. This is normal after surgery.  If it fills with liquid or blood please call us immediately to change it for you. Use the provided ice machine or Ice packs as often as possible for the first 3-4 days, then as needed for pain relief.   Keep a layer of cloth or a shirt between your skin and the cooling unit to prevent frost bite as it can get very cold.  SHOWERING: - You may shower on Post-Op Day #2.  - The dressing is water resistant but do not scrub it as it may start to peel up.   - You may remove the sling for showering, but keep a water resistant pillow under the arm to keep both the  elbow and shoulder away from the body (mimicking the abduction sling).  - Gently pat the area dry.  - Do not soak the shoulder in water. Do not go swimming in the pool or ocean until your incision has completely healed (about 4-6 weeks after surgery) - KEEP THE INCISIONS CLEAN AND DRY.  EXERCISES Wear the sling at all times ou may remove the sling for showering, but keep the arm across the chest or in a secondary sling.    Accidental/Purposeful External Rotation and shoulder flexion (reaching behind you) is to be avoided at all costs for the first month. Do not lift anything heavier than 1 pound until we discuss it further in clinic.   REGIONAL ANESTHESIA (NERVE BLOCKS) The anesthesia team may have performed a nerve block for you if safe in the setting of your care.  This is a great tool used to minimize pain.  Typically the block may start wearing off overnight but the long acting medicine may last for  3-4 days.  The nerve block wearing off can be a challenging period but please utilize your as needed pain medications to try and manage this period.    POST-OP MEDICATIONS- Multimodal approach to pain control In general your pain will be controlled with a combination of substances.  Prescriptions unless otherwise discussed are electronically sent to your pharmacy.  This is a carefully made plan we use to minimize narcotic use.     Etodolac - Anti-inflammatory medication taken on a scheduled basis You have this prescription already Continue taking daily for 2 weeks Acetaminophen - Non-narcotic pain medicine taken on a scheduled basis  Oxycodone - This is a strong narcotic, to be used only on an "as needed" basis for SEVERE pain. Aspirin 81mg  - This medicine is used to minimize the risk of blood clots after surgery. Omeprazole - daily medicine to protect your stomach while taking anti-inflammatories.  Reglan -  take as needed for nausea/vomiting   FOLLOW-UP If you develop a Fever (>101.5), Redness or Drainage from the surgical incision site, please call our office to arrange for an evaluation. Please call the office to schedule a follow-up appointment for a wound check, 7-10 days post-operatively.  IF YOU HAVE ANY QUESTIONS, PLEASE FEEL FREE TO CALL OUR OFFICE.  HELPFUL INFORMATION  If you had a block, it will wear off between 8-24 hrs postop typically.  This is period when your pain may go from nearly zero to the pain you would have had post-op without the block.  This is an abrupt transition but nothing dangerous is happening.  You may take an extra dose of narcotic when this happens.  Your arm will be in a sling following surgery. You will be in this sling for the next 4 weeks.  I will let you know the exact duration at your follow-up visit.  You may be more comfortable sleeping in a semi-seated position the first few nights following surgery.  Keep a pillow propped under the elbow and  forearm for comfort.  If you have a recliner type of chair it might be beneficial.  If not that is fine too, but it would be helpful to sleep propped up with pillows behind your operated shoulder as well under your elbow and forearm.  This will reduce pulling on the suture lines.  When dressing, put your operative arm in the sleeve first.  When getting undressed, take your operative arm out last.  Loose fitting, button-down shirts are recommended.  In most states it is against the law to drive while your arm is in a sling. And certainly against the law to drive while taking narcotics.  You may return to work/school in the next couple of days when you feel up to it. Desk work and typing in the sling is fine.  We suggest you use the pain medication the first night prior to going to bed, in order to ease any pain when the anesthesia wears off. You should avoid taking pain medications on an empty stomach as it will make you nauseous.  Do not drink alcoholic beverages or take illicit drugs when taking pain medications.  Pain medication may make you constipated.  Below are a few solutions to try in this order: Decrease the amount of pain medication if you aren't having pain. Drink lots of decaffeinated fluids. Drink prune juice and/or each dried prunes  If the first 3 don't work start with additional solutions Take Colace - an over-the-counter stool softener Take Senokot - an over-the-counter laxative Take Miralax - a stronger over-the-counter laxative   Dental Antibiotics:  In most cases prophylactic antibiotics for Dental procdeures after total joint surgery are not necessary.  Exceptions are as follows:  1. History of prior total joint infection  2. Severely immunocompromised (Organ Transplant, cancer chemotherapy, Rheumatoid biologic meds such as Merrill)  3. Poorly controlled diabetes (A1C &gt; 8.0, blood glucose over 200)  If you have one of these conditions, contact your surgeon  for an antibiotic prescription, prior to your dental procedure.   For more information including helpful videos and documents visit our website:   [email or web address]

## 2021-07-05 NOTE — H&P (Signed)
PREOPERATIVE H&P  Chief Complaint: right proximal humerus fracture  HPI: Amy Barron is a 80 y.o. female who presents for preoperative history and physical with a diagnosis of right proximal humerus fracture. Symptoms are rated as moderate to severe, and have been worsening.  This is significantly impairing activities of daily living.  Please see my clinic note for full details on this patient's care.  She has elected for surgical management.   Past Medical History:  Diagnosis Date   BCC (basal cell carcinoma of skin)    Cataract 2018   corrected with surgery   History of renal stone    Hyperlipidemia    Hypertension    Past Surgical History:  Procedure Laterality Date   ABDOMINAL HYSTERECTOMY  1998   CATARACT EXTRACTION W/ INTRAOCULAR LENS  IMPLANT, BILATERAL  10/2016   COLONOSCOPY W/ BIOPSIES AND POLYPECTOMY     DILATION AND CURETTAGE OF UTERUS     EYELID CARCINOMA EXCISION Right    June 2022   MULTIPLE TOOTH EXTRACTIONS     ORIF HUMERUS FRACTURE Left 07/11/2015   Procedure: OPEN REDUCTION INTERNAL FIXATION (ORIF) PROXIMAL HUMERUS FRACTURE;  Surgeon: Marybelle Killings, MD;  Location: [facility];  Service: Orthopedics;  Laterality: Left;   ORIF WRIST FRACTURE  01/18/2012   Procedure: OPEN REDUCTION INTERNAL FIXATION (ORIF) WRIST FRACTURE;  Surgeon: Marybelle Killings, MD;  Location: [facility];  Service: Orthopedics;  Laterality: Left;   PARATHYROIDECTOMY     SKIN CANCER EXCISION Right 07/01/2021   neck   TUBAL LIGATION     Social History   Socioeconomic History   Marital status: Married    Spouse name: Not on file   Number of children: Not on file   Years of education: Not on file   Highest education level: Not on file  Occupational History   Not on file  Tobacco Use   Smoking status: Never   Smokeless tobacco: Never  Vaping Use   Vaping Use: Never used  Substance and Sexual Activity   Alcohol use: No   Drug use: No   Sexual activity: Never  Other Topics Concern   Not on file   Social History Narrative   Desires CPR.   Would not want prolonged life support if futile   She does have a Living Will.   Married 08/2017   Social Determinants of Health   Financial Resource Strain: Low Risk    Difficulty of Paying Living Expenses: Not hard at all  Food Insecurity: No Food Insecurity   Worried About Charity fundraiser in the Last Year: Never true   Arboriculturist in the Last Year: Never true  Transportation Needs: No Transportation Needs   Lack of Transportation (Medical): No   Lack of Transportation (Non-Medical): No  Physical Activity: Sufficiently Active   Days of Exercise per Week: 3 days   Minutes of Exercise per Session: 50 min  Stress: No Stress Concern Present   Feeling of Stress : Not at all  Social Connections: Socially Integrated   Frequency of Communication with Friends and Family: Twice a week   Frequency of Social Gatherings with Friends and Family: Twice a week   Attends Religious Services: More than 4 times per year   Active Member of [employer] or Organizations: Yes   Attends Music therapist: More than 4 times per year   Marital Status: Married   Family History  Problem Relation Age of Onset   Cancer Father  Colon cancer Father 31   Diabetes Other    Breast cancer Maternal Aunt    Allergies  Allergen Reactions   Phenergan [Promethazine Hcl] Other (See Comments)    Lack of effect, not an allergy   Zofran [Ondansetron Hcl]     Lack of effect, not an allergy   Prior to Admission medications   Medication Sig Start Date End Date Taking? Authorizing Provider  acetaminophen (TYLENOL) 500 MG tablet Take 1,000 mg by mouth every 8 (eight) hours as needed for moderate pain.   Yes [provider]  amLODipine (NORVASC) 5 MG tablet Take 1 tablet (5 mg total) by mouth daily. 06/25/21  Yes Tonia Ghent, MD  Ascorbic Acid (VITAMIN C) 250 MG CHEW Chew 250 mg by mouth daily.   Yes [provider]  cholecalciferol  (VITAMIN D3) 25 MCG (1000 UNIT) tablet Take 1,000 Units by mouth daily.   Yes [provider]  etodolac (LODINE) 400 MG tablet TAKE 1 TABLET (400 MG) BY MOUTH DAILY AS NEEDED WITH FOOD. 06/25/21  Yes Tonia Ghent, MD  oxyCODONE (OXY IR/ROXICODONE) 5 MG immediate release tablet Take 5 mg by mouth every 6 (six) hours as needed for severe pain.   Yes [provider]  triamterene-hydrochlorothiazide (MAXZIDE-25) 37.5-25 MG tablet Take 1 tablet by mouth daily. 06/25/21  Yes Tonia Ghent, MD  ibuprofen (ADVIL) 800 MG tablet Take 1 tablet (800 mg total) by mouth 3 (three) times daily. Patient not taking: Reported on 07/03/2021 07/02/21   Sherrill Raring, PA-C  vitamin B-12 (CYANOCOBALAMIN) 1000 MCG tablet Take 1 tablet (1,000 mcg total) by mouth daily. Patient not taking: Reported on 07/03/2021 06/28/21   Tonia Ghent, MD     Positive ROS: All other systems have been reviewed and were otherwise negative with the exception of those mentioned in the HPI and as above.  Physical Exam: General: Alert, no acute distress Cardiovascular: No pedal edema Respiratory: No cyanosis, no use of accessory musculature GI: No organomegaly, abdomen is soft and non-tender Skin: No lesions in the area of chief complaint Neurologic: Sensation intact distally Psychiatric: Patient is competent for consent with normal mood and affect Lymphatic: No axillary or cervical lymphadenopathy  MUSCULOSKELETAL: Right axillar nerve sensation is intact but unable to really test motor secondary to pain.  Distal motor and sensory function of the right upper extremity is intact.  Assessment: right proximal humerus fracture  Plan: Plan for Procedure(s): REVERSE SHOULDER ARTHROPLASTY  The risks benefits and alternatives were discussed with the patient including but not limited to the risks of nonoperative treatment, versus surgical intervention including infection, bleeding, nerve injury,  blood clots,  cardiopulmonary complications, morbidity, mortality, among others, and they were willing to proceed.   Hiram Gash, MD  07/05/2021 9:10 AM

## 2021-07-05 NOTE — Interval H&P Note (Signed)
All questions answered, patient wants to proceed with procedure. ? ?

## 2021-07-05 NOTE — Op Note (Signed)
Orthopaedic Surgery Operative Note (CSN: [id number])  Amy Barron  1941-06-08 Date of Surgery: 07/05/2021   Diagnoses:  Right proximal humerus fracture, comminuted  Procedure: Right reverse total Shoulder Arthroplasty Right open reduction internal fixation of greater tuberosity   Operative Finding Successful completion of planned procedure.  Patient had a comminuted fracturing would have had extremely high risk of nonunion or malunion.  Her tuberosities were very comminuted.  The head was essentially fully stripped.  We had robust fixation with our stem and baseplate.  Post-operative plan: The patient will be NWB in sling.  The patient will be will be discharged from PACU if continues to be stable as was plan prior to surgery.  DVT prophylaxis Aspirin 81 mg twice daily for 6 weeks.  Pain control with PRN pain medication preferring oral medicines.  Follow up plan will be scheduled in approximately 7 days for incision check and XR.  Physical therapy to start after 4 weeks.  Implants: Tornier size 3B long implant, 0 low offset tray with a 36+6 polyethylene, 36 standard glenosphere with a 25 standard baseplate and a 35 center screw  Post-Op Diagnosis: Same Surgeons:Primary: Hiram Gash, MD Assistants:Caroline McBane PA-C Location: [facility] OR ROOM 05 Anesthesia: General with Exparel Interscalene Antibiotics: Ancef 2g preop, Vancomycin 1000mg  locally Tourniquet time: None Estimated Blood Loss: 245 Complications: None Specimens: None Implants: Implant Name Type Inv. Item Serial No. Manufacturer Lot No. LRB No. Used Action  BASEPLATE GLENOSPHERE 80DX STD - [id number] Miscellaneous BASEPLATE GLENOSPHERE 67HA STD [id number] [employer]  Right 1 Implanted  GLENOSPHERE REV SHOULDER 36 - [id number] Joint GLENOSPHERE REV SHOULDER 36 [id number] [employer]  Right 1 Implanted  TRAY SHOULDER REV OFFSET 1.5 - [id number] Joint TRAY SHOULDER REV OFFSET 1.5 [id number] [employer]  Right 1  Implanted  INSERT HUMERAL 36X6MM 12.5DEG - [id number] Insert INSERT HUMERAL 36X6MM 12.5DEG [id number] [employer]  Right 1 Implanted  STEM HUMERAL SZ 3B LONG 98MM - [id number] Stem STEM HUMERAL SZ 3B LONG 98MM [id number] [employer]  Right 1 Implanted    Indications for Surgery:   Amy Barron is a 80 y.o. female with fall resulting in a complex comminuted right proximal humerus fracture.  Benefits and risks of operative and nonoperative management were discussed prior to surgery with patient/guardian(s) and informed consent form was completed.  Infection and need for further surgery were discussed as was prosthetic stability and cuff issues.  We additionally specifically discussed risks of axillary nerve injury, infection, periprosthetic fracture, continued pain and longevity of implants prior to beginning procedure.      Procedure:   The patient was identified in the preoperative holding area where the surgical site was marked. Block placed by anesthesia with exparel.  The patient was taken to the OR where a procedural timeout was called and the above noted anesthesia was induced.  The patient was positioned beachchair on allen table with spider arm positioner.  Preoperative antibiotics were dosed.  The patient's right shoulder was prepped and draped in the usual sterile fashion.  A second preoperative timeout was called.       Standard deltopectoral approach was performed with a #10 blade. We dissected down to the subcutaneous tissues and the cephalic vein was taken laterally with the deltoid. Clavipectoral fascia was incised in line with the incision. Deep retractors were placed. The long of the biceps tendon was identified and there was significant tenosynovitis present.  Tenodesis was performed to the pectoralis tendon with #2 Ethibond. The remaining biceps was  followed up into the rotator interval where it was released.    We used the bicipital groove as a landmark for the lesser and  greater tuberosity fragments.  We were able to mobilize the lesser tuberosity fragment and placed stay sutures in the bone tendon junction to help with mobilization.  This point we were able to identify the  greater tuberosity fragment and 4 #5  FiberWire sutures were used to place into this for eventual repair of the tuberosities.  Once these were both mobilized we took care to identify the shaft fragment as well as the head fragment.  We carefully identified the head fragment were able to manually remove it.  At this point the axillary nerve was found and palpated and with a tug test noted to be intact.  Protected throughout the remainder of the case with blunt retractors.    We then released the SGHL with bovie cautery prior to placing a curved mayo at the junction of the anterior glenoid well above the axillary nerve and bluntly dissecting the subscapularis from the capsule.  We then carefully protected the axillary nerve as we gently released the inferior capsule to fully mobilize the subscapularis.  An anterior deltoid retractor was then placed as well as a small Hohmann retractor superiorly.   The glenoid was relatively preserved as we would expect in this fracture patient.  The remaining labrum was removed circumferentially taking great care not to disrupt the posterior capsule.    The glenoid drill guide was placed and used to drill a guide pin in the center, inferior position. The glenoid face was then reamed concentrically over the guide wire. The center hole was drilled over the guidepin in a near anatomic angle of version. Next the glenoid vault was drilled back to a depth of 35 mm.  We tapped and then placed a 36mm size baseplate with 0 lateralization was selected with a 6.5 mm x 5mm length central screw.  The base plate was screwed into the glenoid vault obtaining secure fixation. We next placed superior and inferior locking screws for additional fixation.  Next a 36 mm glenosphere was selected  and impacted onto the baseplate. The center screw was tightened.   We then repositioned the arm to give access to the humeral shaft fragment.  Drill holes were placed and fiberwire sutures in the shaft for vertical fixation of the tuberosities.  We broached starting with a size one broach and broaching up to 3 which obtained an appropriate fit and there was no sign of rotational instability.    We trialed with multiple size tray and polyethylene options and selected a 0 low which provided good stability and range of motion without excess soft tissue tension. The offset was dialed in to match the normal anatomy. The shoulder was trialed.  There was good ROM in all planes and the shoulder was stable with no inferior translation.   We then mobilized her tuberosities again and placed the anterior deep limbs of the 4 #5 fiber wires around the stem.  1 of these was tied down fixing the greater tuberosity in place after bone graft harvest from the humeral head component was placed underneath.  A +0 low offset tray was selected and impacted onto the stem.   A 36+6 polyethylene liner was impacted onto the stem.  The joint was reduced and thoroughly irrigated with pulsatile lavage. The remaining sutures were then placed through the subscapularis and the bone tendon junction and the tuberosities were reduced  after bone graft placed beneath as autograft at the subscap.  We horizontally secured the tuberosities before placing vertical fixation with the suture that was placed into the shaft.  Tuberosities moved as a unit were happy with her overall reduction.  This was checked on fluoroscopy confirming our position.  We irrigated copiously at this point.  Hemostasis was obtained. The deltopectoral interval was reapproximated with #1 Ethibond. The subcutaneous tissues were closed with 3-0 Vicryl and the skin was closed with running monocryl.     The wounds were cleaned and dried and an Aquacel dressing was placed. The  drapes taken down. The arm was placed into sling with abduction pillow. Patient was awakened, extubated, and transferred to the recovery room in stable condition. There were no intraoperative complications. The sponge, needle, and attention counts were correct at the end of the case.     Noemi Chapel, PA-C, present and scrubbed throughout the case, critical for completion in a timely fashion, and for retraction, instrumentation, closure.

## 2021-07-08 ENCOUNTER — Encounter (HOSPITAL_COMMUNITY): Payer: Self-pay | Admitting: Orthopaedic Surgery

## 2021-07-14 DIAGNOSIS — S42201D Unspecified fracture of upper end of right humerus, subsequent encounter for fracture with routine healing: Secondary | ICD-10-CM | POA: Diagnosis not present

## 2021-07-28 ENCOUNTER — Ambulatory Visit: Payer: PPO | Admitting: Cardiology

## 2021-08-03 DIAGNOSIS — Z471 Aftercare following joint replacement surgery: Secondary | ICD-10-CM | POA: Diagnosis not present

## 2021-08-03 DIAGNOSIS — M25511 Pain in right shoulder: Secondary | ICD-10-CM | POA: Diagnosis not present

## 2021-08-04 DIAGNOSIS — S42201D Unspecified fracture of upper end of right humerus, subsequent encounter for fracture with routine healing: Secondary | ICD-10-CM | POA: Diagnosis not present

## 2021-08-05 DIAGNOSIS — M25511 Pain in right shoulder: Secondary | ICD-10-CM | POA: Diagnosis not present

## 2021-08-05 DIAGNOSIS — Z471 Aftercare following joint replacement surgery: Secondary | ICD-10-CM | POA: Diagnosis not present

## 2021-08-10 DIAGNOSIS — M25511 Pain in right shoulder: Secondary | ICD-10-CM | POA: Diagnosis not present

## 2021-08-10 DIAGNOSIS — Z471 Aftercare following joint replacement surgery: Secondary | ICD-10-CM | POA: Diagnosis not present

## 2021-08-12 DIAGNOSIS — Z471 Aftercare following joint replacement surgery: Secondary | ICD-10-CM | POA: Diagnosis not present

## 2021-08-12 DIAGNOSIS — M25511 Pain in right shoulder: Secondary | ICD-10-CM | POA: Diagnosis not present

## 2021-08-17 DIAGNOSIS — Z471 Aftercare following joint replacement surgery: Secondary | ICD-10-CM | POA: Diagnosis not present

## 2021-08-17 DIAGNOSIS — M25511 Pain in right shoulder: Secondary | ICD-10-CM | POA: Diagnosis not present

## 2021-08-19 DIAGNOSIS — M25511 Pain in right shoulder: Secondary | ICD-10-CM | POA: Diagnosis not present

## 2021-08-19 DIAGNOSIS — Z471 Aftercare following joint replacement surgery: Secondary | ICD-10-CM | POA: Diagnosis not present

## 2021-08-24 DIAGNOSIS — M25511 Pain in right shoulder: Secondary | ICD-10-CM | POA: Diagnosis not present

## 2021-08-24 DIAGNOSIS — Z471 Aftercare following joint replacement surgery: Secondary | ICD-10-CM | POA: Diagnosis not present

## 2021-08-26 DIAGNOSIS — M25511 Pain in right shoulder: Secondary | ICD-10-CM | POA: Diagnosis not present

## 2021-08-26 DIAGNOSIS — Z471 Aftercare following joint replacement surgery: Secondary | ICD-10-CM | POA: Diagnosis not present

## 2021-08-31 DIAGNOSIS — M25511 Pain in right shoulder: Secondary | ICD-10-CM | POA: Diagnosis not present

## 2021-08-31 DIAGNOSIS — Z471 Aftercare following joint replacement surgery: Secondary | ICD-10-CM | POA: Diagnosis not present

## 2021-09-02 DIAGNOSIS — M25511 Pain in right shoulder: Secondary | ICD-10-CM | POA: Diagnosis not present

## 2021-09-02 DIAGNOSIS — Z471 Aftercare following joint replacement surgery: Secondary | ICD-10-CM | POA: Diagnosis not present

## 2021-09-07 DIAGNOSIS — M25511 Pain in right shoulder: Secondary | ICD-10-CM | POA: Diagnosis not present

## 2021-09-07 DIAGNOSIS — Z471 Aftercare following joint replacement surgery: Secondary | ICD-10-CM | POA: Diagnosis not present

## 2021-09-29 DIAGNOSIS — S42201D Unspecified fracture of upper end of right humerus, subsequent encounter for fracture with routine healing: Secondary | ICD-10-CM | POA: Diagnosis not present

## 2021-10-05 DIAGNOSIS — L570 Actinic keratosis: Secondary | ICD-10-CM | POA: Diagnosis not present

## 2021-10-05 DIAGNOSIS — B079 Viral wart, unspecified: Secondary | ICD-10-CM | POA: Diagnosis not present

## 2021-10-05 DIAGNOSIS — D485 Neoplasm of uncertain behavior of skin: Secondary | ICD-10-CM | POA: Diagnosis not present

## 2021-10-27 ENCOUNTER — Telehealth: Payer: Self-pay

## 2021-10-27 NOTE — Telephone Encounter (Signed)
[facility] with Health team called for pier exception for etodolac 400 mg. Ronal Fear said she needed additional information and will fax request to [facility] at [phone]. Sending note to [facility]. ?

## 2021-10-30 NOTE — Telephone Encounter (Signed)
Form has been filled out and will fax back on Monday  ?

## 2021-11-02 NOTE — Telephone Encounter (Signed)
Form has been faxed.

## 2022-01-13 DIAGNOSIS — H402213 Chronic angle-closure glaucoma, right eye, severe stage: Secondary | ICD-10-CM | POA: Diagnosis not present

## 2022-01-13 DIAGNOSIS — H402221 Chronic angle-closure glaucoma, left eye, mild stage: Secondary | ICD-10-CM | POA: Diagnosis not present

## 2022-01-13 DIAGNOSIS — H35033 Hypertensive retinopathy, bilateral: Secondary | ICD-10-CM | POA: Diagnosis not present

## 2022-01-13 DIAGNOSIS — H353131 Nonexudative age-related macular degeneration, bilateral, early dry stage: Secondary | ICD-10-CM | POA: Diagnosis not present

## 2022-05-03 ENCOUNTER — Telehealth: Payer: Self-pay | Admitting: *Deleted

## 2022-05-03 NOTE — Patient Outreach (Signed)
  Care Coordination   05/03/2022 Name: Amy Barron MRN: [id number] DOB: July 21, 1941   Care Coordination Outreach Attempts:  An unsuccessful telephone outreach was attempted today to offer the patient information about available care coordination services as a benefit of their health plan.   Follow Up Plan:  Additional outreach attempts will be made to offer the patient care coordination information and services.   Encounter Outcome:  No Answer  Care Coordination Interventions Activated:  Yes   Care Coordination Interventions:  No, not indicated    [facility] [phone]

## 2022-05-19 ENCOUNTER — Other Ambulatory Visit: Payer: Self-pay | Admitting: Family Medicine

## 2022-06-20 ENCOUNTER — Other Ambulatory Visit: Payer: Self-pay | Admitting: Family Medicine

## 2022-06-20 DIAGNOSIS — I10 Essential (primary) hypertension: Secondary | ICD-10-CM

## 2022-06-20 DIAGNOSIS — R5383 Other fatigue: Secondary | ICD-10-CM

## 2022-06-20 DIAGNOSIS — M8580 Other specified disorders of bone density and structure, unspecified site: Secondary | ICD-10-CM

## 2022-06-21 ENCOUNTER — Other Ambulatory Visit (INDEPENDENT_AMBULATORY_CARE_PROVIDER_SITE_OTHER): Payer: PPO

## 2022-06-21 DIAGNOSIS — R5383 Other fatigue: Secondary | ICD-10-CM

## 2022-06-21 DIAGNOSIS — M8580 Other specified disorders of bone density and structure, unspecified site: Secondary | ICD-10-CM | POA: Diagnosis not present

## 2022-06-21 DIAGNOSIS — I10 Essential (primary) hypertension: Secondary | ICD-10-CM | POA: Diagnosis not present

## 2022-06-21 LAB — COMPREHENSIVE METABOLIC PANEL
ALT: 13 U/L (ref 0–35)
AST: 20 U/L (ref 0–37)
Albumin: 4.5 g/dL (ref 3.5–5.2)
Alkaline Phosphatase: 76 U/L (ref 39–117)
BUN: 21 mg/dL (ref 6–23)
CO2: 29 mEq/L (ref 19–32)
Calcium: 9.5 mg/dL (ref 8.4–10.5)
Chloride: 100 mEq/L (ref 96–112)
Creatinine, Ser: 0.9 mg/dL (ref 0.40–1.20)
GFR: 60.53 mL/min (ref >60.00)
Glucose, Bld: 106 mg/dL — ABNORMAL HIGH (ref 70–99)
Potassium: 3.9 mEq/L (ref 3.5–5.1)
Sodium: 137 mEq/L (ref 135–145)
Total Bilirubin: 0.6 mg/dL (ref 0.2–1.2)
Total Protein: 6.9 g/dL (ref 6.0–8.3)

## 2022-06-21 LAB — CBC WITH DIFFERENTIAL/PLATELET
Basophils Absolute: 0.1 10*3/uL (ref 0.0–0.1)
Basophils Relative: 0.8 % (ref 0.0–3.0)
Eosinophils Absolute: 0.1 10*3/uL (ref 0.0–0.7)
Eosinophils Relative: 1.4 % (ref 0.0–5.0)
HCT: 42.5 % (ref 36.0–46.0)
Hemoglobin: 14.4 g/dL (ref 12.0–15.0)
Lymphocytes Relative: 26.8 % (ref 12.0–46.0)
Lymphs Abs: 1.8 10*3/uL (ref 0.7–4.0)
MCHC: 33.8 g/dL (ref 30.0–36.0)
MCV: 95.5 fl (ref 78.0–100.0)
Monocytes Absolute: 0.5 10*3/uL (ref 0.1–1.0)
Monocytes Relative: 7.3 % (ref 3.0–12.0)
Neutro Abs: 4.3 10*3/uL (ref 1.4–7.7)
Neutrophils Relative %: 63.7 % (ref 43.0–77.0)
Platelets: 298 10*3/uL (ref 150.0–400.0)
RBC: 4.45 Mil/uL (ref 3.87–5.11)
RDW: 13.9 % (ref 11.5–15.5)
WBC: 6.7 10*3/uL (ref 4.0–10.5)

## 2022-06-21 LAB — LIPID PANEL
Cholesterol: 241 mg/dL — ABNORMAL HIGH (ref 0–200)
HDL: 64.7 mg/dL (ref >39.00)
LDL Cholesterol: 152 mg/dL — ABNORMAL HIGH (ref 0–99)
NonHDL: 176.26
Total CHOL/HDL Ratio: 4
Triglycerides: 121 mg/dL (ref 0.0–149.0)
VLDL: 24.2 mg/dL (ref 0.0–40.0)

## 2022-06-21 LAB — VITAMIN B12: Vitamin B-12: 279 pg/mL (ref 211–911)

## 2022-06-21 LAB — VITAMIN D 25 HYDROXY (VIT D DEFICIENCY, FRACTURES): VITD: 33.87 ng/mL (ref 30.00–100.00)

## 2022-06-25 ENCOUNTER — Ambulatory Visit (INDEPENDENT_AMBULATORY_CARE_PROVIDER_SITE_OTHER): Payer: PPO

## 2022-06-25 VITALS — Ht 64.0 in | Wt 158.0 lb

## 2022-06-25 DIAGNOSIS — Z0000 Encounter for general adult medical examination without abnormal findings: Secondary | ICD-10-CM | POA: Diagnosis not present

## 2022-06-25 NOTE — Patient Instructions (Addendum)
[name] , Thank you for taking time to come for your Medicare Wellness Visit. I appreciate your ongoing commitment to your health goals. Please review the following plan we discussed and let me know if I can assist you in the future.   These are the goals we discussed:  Goals      Increase physical activity     Starting 05/25/2017, I will continue to exercise for at least 78 minutes 3 days per week.      Patient Stated     05/28/2020, I will continue to do yoga 2 days a week and walk 3 days a week for 1 hour.      Patient Stated     Would like to maintain current health status     Stay Healthy        This is a list of the screening recommended for you and due dates:  Health Maintenance  Topic Date Due   DTaP/Tdap/Td vaccine (1 - Tdap) Never done   COVID-19 Vaccine (2 - 2023-24 season) 07/11/2022*   Zoster (Shingles) Vaccine (1 of 2) 09/24/2022*   Flu Shot  10/24/2022*   Medicare Annual Wellness Visit  06/26/2023   Pneumonia Vaccine  Completed   DEXA scan (bone density measurement)  Completed   HPV Vaccine  Aged Out   Mammogram  Discontinued   Colon Cancer Screening  Discontinued  *Topic was postponed. The date shown is not the original due date.    Advanced directives: Please bring a copy of your health care power of attorney and living will to the office to be added to your chart at your convenience.   Conditions/risks identified: None  Next appointment: Follow up in one year for your annual wellness visit     Preventive Care 65 Years and Older, Female Preventive care refers to lifestyle choices and visits with your health care provider that can promote health and wellness. What does preventive care include? A yearly physical exam. This is also called an annual well check. Dental exams once or twice a year. Routine eye exams. Ask your health care provider how often you should have your eyes checked. Personal lifestyle choices, including: Daily care of your  teeth and gums. Regular physical activity. Eating a healthy diet. Avoiding tobacco and drug use. Limiting alcohol use. Practicing safe sex. Taking low-dose aspirin every day. Taking vitamin and mineral supplements as recommended by your health care provider. What happens during an annual well check? The services and screenings done by your health care provider during your annual well check will depend on your age, overall health, lifestyle risk factors, and family history of disease. Counseling  Your health care provider may ask you questions about your: Alcohol use. Tobacco use. Drug use. Emotional well-being. Home and relationship well-being. Sexual activity. Eating habits. History of falls. Memory and ability to understand (cognition). Work and work Statistician. Reproductive health. Screening  You may have the following tests or measurements: Height, weight, and BMI. Blood pressure. Lipid and cholesterol levels. These may be checked every 5 years, or more frequently if you are over 68 years old. Skin check. Lung cancer screening. You may have this screening every year starting at age 60 if you have a 30-pack-year history of smoking and currently smoke or have quit within the past 15 years. Fecal occult blood test (FOBT) of the stool. You may have this test every year starting at age 36. Flexible sigmoidoscopy or colonoscopy. You may have a sigmoidoscopy every 5 years  or a colonoscopy every 10 years starting at age 33. Hepatitis C blood test. Hepatitis B blood test. Sexually transmitted disease (STD) testing. Diabetes screening. This is done by checking your blood sugar (glucose) after you have not eaten for a while (fasting). You may have this done every 1-3 years. Bone density scan. This is done to screen for osteoporosis. You may have this done starting at age 66. Mammogram. This may be done every 1-2 years. Talk to your health care provider about how often you should have  regular mammograms. Talk with your health care provider about your test results, treatment options, and if necessary, the need for more tests. Vaccines  Your health care provider may recommend certain vaccines, such as: Influenza vaccine. This is recommended every year. Tetanus, diphtheria, and acellular pertussis (Tdap, Td) vaccine. You may need a Td booster every 10 years. Zoster vaccine. You may need this after age 47. Pneumococcal 13-valent conjugate (PCV13) vaccine. One dose is recommended after age 56. Pneumococcal polysaccharide (PPSV23) vaccine. One dose is recommended after age 61. Talk to your health care provider about which screenings and vaccines you need and how often you need them. This information is not intended to replace advice given to you by your health care provider. Make sure you discuss any questions you have with your health care provider. Document Released: 08/08/2015 Document Revised: 03/31/2016 Document Reviewed: 05/13/2015 Elsevier Interactive Patient Education  2017 [place] Prevention in the Home Falls can cause injuries. They can happen to people of all ages. There are many things you can do to make your home safe and to help prevent falls. What can I do on the outside of my home? Regularly fix the edges of walkways and driveways and fix any cracks. Remove anything that might make you trip as you walk through a door, such as a raised step or threshold. Trim any bushes or trees on the path to your home. Use bright outdoor lighting. Clear any walking paths of anything that might make someone trip, such as rocks or tools. Regularly check to see if handrails are loose or broken. Make sure that both sides of any steps have handrails. Any raised decks and porches should have guardrails on the edges. Have any leaves, snow, or ice cleared regularly. Use sand or salt on walking paths during winter. Clean up any spills in your garage right away. This  includes oil or grease spills. What can I do in the bathroom? Use night lights. Install grab bars by the toilet and in the tub and shower. Do not use towel bars as grab bars. Use non-skid mats or decals in the tub or shower. If you need to sit down in the shower, use a plastic, non-slip stool. Keep the floor dry. Clean up any water that spills on the floor as soon as it happens. Remove soap buildup in the tub or shower regularly. Attach bath mats securely with double-sided non-slip rug tape. Do not have throw rugs and other things on the floor that can make you trip. What can I do in the bedroom? Use night lights. Make sure that you have a light by your bed that is easy to reach. Do not use any sheets or blankets that are too big for your bed. They should not hang down onto the floor. Have a firm chair that has side arms. You can use this for support while you get dressed. Do not have throw rugs and other things on the floor  that can make you trip. What can I do in the kitchen? Clean up any spills right away. Avoid walking on wet floors. Keep items that you use a lot in easy-to-reach places. If you need to reach something above you, use a strong step stool that has a grab bar. Keep electrical cords out of the way. Do not use floor polish or wax that makes floors slippery. If you must use wax, use non-skid floor wax. Do not have throw rugs and other things on the floor that can make you trip. What can I do with my stairs? Do not leave any items on the stairs. Make sure that there are handrails on both sides of the stairs and use them. Fix handrails that are broken or loose. Make sure that handrails are as long as the stairways. Check any carpeting to make sure that it is firmly attached to the stairs. Fix any carpet that is loose or worn. Avoid having throw rugs at the top or bottom of the stairs. If you do have throw rugs, attach them to the floor with carpet tape. Make sure that you  have a light switch at the top of the stairs and the bottom of the stairs. If you do not have them, ask someone to add them for you. What else can I do to help prevent falls? Wear shoes that: Do not have high heels. Have rubber bottoms. Are comfortable and fit you well. Are closed at the toe. Do not wear sandals. If you use a stepladder: Make sure that it is fully opened. Do not climb a closed stepladder. Make sure that both sides of the stepladder are locked into place. Ask someone to hold it for you, if possible. Clearly mark and make sure that you can see: Any grab bars or handrails. First and last steps. Where the edge of each step is. Use tools that help you move around (mobility aids) if they are needed. These include: Canes. Walkers. Scooters. Crutches. Turn on the lights when you go into a dark area. Replace any light bulbs as soon as they burn out. Set up your furniture so you have a clear path. Avoid moving your furniture around. If any of your floors are uneven, fix them. If there are any pets around you, be aware of where they are. Review your medicines with your doctor. Some medicines can make you feel dizzy. This can increase your chance of falling. Ask your doctor what other things that you can do to help prevent falls. This information is not intended to replace advice given to you by your health care provider. Make sure you discuss any questions you have with your health care provider. Document Released: 05/08/2009 Document Revised: 12/18/2015 Document Reviewed: 08/16/2014 Elsevier Interactive Patient Education  2017 [employer].

## 2022-06-25 NOTE — Progress Notes (Signed)
Subjective:   Amy Barron is a 81 y.o. female who presents for Medicare Annual (Subsequent) preventive examination.  Review of Systems    Virtual Visit via Telephone Note  I connected with  Amy Barron on 06/25/22 at  9:45 AM EST by telephone and verified that I am speaking with the correct person using two identifiers.  Location: Patient: Home Provider: Office Persons participating in the virtual visit: patient/Nurse Health Advisor   I discussed the limitations, risks, security and privacy concerns of performing an evaluation and management service by telephone and the availability of in person appointments. The patient expressed understanding and agreed to proceed.  Interactive audio and video telecommunications were attempted between this nurse and patient, however failed, due to patient having technical difficulties OR patient did not have access to video capability.  We continued and completed visit with audio only.  Some vital signs may be absent or patient reported.   Criselda Peaches, LPN  Cardiac Risk Factors include: advanced age (>71mn, >>93women);hypertension     Objective:    Today's Vitals   06/25/22 0950  Weight: 158 lb (71.7 kg)  Height: '5\' 4"'$  (1.626 m)   Body mass index is 27.12 kg/m.     06/25/2022    9:57 AM 07/02/2021   12:47 PM 06/24/2021    9:02 AM 05/28/2020    9:46 AM 05/26/2018    3:42 PM 05/25/2017    9:29 AM 11/12/2016    9:22 AM  Advanced Directives  Does Patient Have a Medical Advance Directive? Yes Yes Yes Yes Yes Yes No  Type of AParamedicof ADuncanLiving will HEmeraldLiving will HFloravilleLiving will HUnionville CenterLiving will Living will;Healthcare Power of [place]ivesvilleLiving will   Does patient want to make changes to medical advance directive?   Yes (MAU/Ambulatory/Procedural Areas - Information given)      Copy of  [place] Chart? No - copy requested   No - copy requested No - copy requested No - copy requested     Current Medications (verified) Outpatient Encounter Medications as of 06/25/2022  Medication Sig   amLODipine (NORVASC) 5 MG tablet Take 1 tablet (5 mg total) by mouth daily.   Ascorbic Acid (VITAMIN C) 250 MG CHEW Chew 250 mg by mouth daily.   cholecalciferol (VITAMIN D3) 25 MCG (1000 UNIT) tablet Take 1,000 Units by mouth daily.   etodolac (LODINE) 400 MG tablet TAKE 1 TABLET (400 MG) BY MOUTH DAILY AS NEEDED WITH FOOD.   metoCLOPramide (REGLAN) 5 MG tablet Take 1 tablet (5 mg total) by mouth every 8 (eight) hours as needed for nausea or vomiting.   triamterene-hydrochlorothiazide (MAXZIDE-25) 37.5-25 MG tablet Take 1 tablet by mouth daily.   vitamin B-12 (CYANOCOBALAMIN) 1000 MCG tablet Take 1 tablet (1,000 mcg total) by mouth daily. (Patient not taking: Reported on 07/03/2021)   No facility-administered encounter medications on file as of 06/25/2022.    Allergies (verified) Phenergan [promethazine hcl] and Zofran [ondansetron hcl]   History: Past Medical History:  Diagnosis Date   BCC (basal cell carcinoma of skin)    Cataract 2018   corrected with surgery   History of renal stone    Hyperlipidemia    Hypertension    Past Surgical History:  Procedure Laterality Date   ABDOMINAL HYSTERECTOMY  1998   CATARACT EXTRACTION W/ INTRAOCULAR LENS  IMPLANT, BILATERAL  10/2016   COLONOSCOPY W/  BIOPSIES AND POLYPECTOMY     DILATION AND CURETTAGE OF UTERUS     EYELID CARCINOMA EXCISION Right    June 2022   MULTIPLE TOOTH EXTRACTIONS     ORIF HUMERUS FRACTURE Left 07/11/2015   Procedure: OPEN REDUCTION INTERNAL FIXATION (ORIF) PROXIMAL HUMERUS FRACTURE;  Surgeon: Marybelle Killings, MD;  Location: [facility];  Service: Orthopedics;  Laterality: Left;   ORIF WRIST FRACTURE  01/18/2012   Procedure: OPEN REDUCTION INTERNAL FIXATION (ORIF) WRIST FRACTURE;  Surgeon: Marybelle Killings,  MD;  Location: [facility];  Service: Orthopedics;  Laterality: Left;   PARATHYROIDECTOMY     REVERSE SHOULDER ARTHROPLASTY Right 07/05/2021   Procedure: REVERSE SHOULDER ARTHROPLASTY;  Surgeon: Hiram Gash, MD;  Location: [facility];  Service: Orthopedics;  Laterality: Right;   SKIN CANCER EXCISION Right 07/01/2021   neck   TUBAL LIGATION     Family History  Problem Relation Age of Onset   Cancer Father    Colon cancer Father 81   Diabetes Other    Breast cancer Maternal Aunt    Social History   Socioeconomic History   Marital status: Married    Spouse name: Not on file   Number of children: Not on file   Years of education: Not on file   Highest education level: Not on file  Occupational History   Not on file  Tobacco Use   Smoking status: Never   Smokeless tobacco: Never  Vaping Use   Vaping Use: Never used  Substance and Sexual Activity   Alcohol use: No   Drug use: No   Sexual activity: Never  Other Topics Concern   Not on file  Social History Narrative   Desires CPR.   Would not want prolonged life support if futile   She does have a Living Will.   Married 08/2017   Social Determinants of Health   Financial Resource Strain: Low Risk  (06/25/2022)   Overall Financial Resource Strain (CARDIA)    Difficulty of Paying Living Expenses: Not hard at all  Food Insecurity: No Food Insecurity (06/25/2022)   Hunger Vital Sign    Worried About Running Out of Food in the Last Year: Never true    Ran Out of Food in the Last Year: Never true  Transportation Needs: No Transportation Needs (06/25/2022)   PRAPARE - Hydrologist (Medical): No    Lack of Transportation (Non-Medical): No  Physical Activity: Insufficiently Active (06/25/2022)   Exercise Vital Sign    Days of Exercise per Week: 2 days    Minutes of Exercise per Session: 30 min  Stress: No Stress Concern Present (06/25/2022)   [place]    Feeling of Stress : Not at all  Social Connections: [place] (06/25/2022)   Social Connection and Isolation Panel [NHANES]    Frequency of Communication with Friends and Family: More than three times a week    Frequency of Social Gatherings with Friends and Family: More than three times a week    Attends Religious Services: More than 4 times per year    Active Member of [employer] or Organizations: Yes    Attends Music therapist: More than 4 times per year    Marital Status: Married    Tobacco Counseling Counseling given: Not Answered   Clinical Intake:  Pre-visit preparation completed: No  Pain : No/denies pain     BMI - recorded: 27.12 Nutritional  Status: BMI 25 -29 Overweight Nutritional Risks: None Diabetes: No  How often do you need to have someone help you when you read instructions, pamphlets, or other written materials from your doctor or pharmacy?: 1 - Never  Diabetic?  No  Interpreter Needed?: No  Information entered by :: Rolene Arbour LPN   Activities of Daily Living    06/25/2022    9:55 AM  In your present state of health, do you have any difficulty performing the following activities:  Hearing? 0  Vision? 0  Difficulty concentrating or making decisions? 0  Walking or climbing stairs? 0  Dressing or bathing? 0  Doing errands, shopping? 0  Preparing Food and eating ? N  Using the Toilet? N  In the past six months, have you accidently leaked urine? N  Do you have problems with loss of bowel control? N  Managing your Medications? N  Managing your Finances? N  Housekeeping or managing your Housekeeping? N    Patient Care Team: Tonia Ghent, MD as PCP - General (Family Medicine)  Indicate any recent Medical Services you may have received from other than [facility] providers in the past year (date may be approximate).     Assessment:   This is a routine wellness examination for [place].  Hearing/Vision  screen Hearing Screening - Comments:: Denies hearing difficulties   Vision Screening - Comments:: up to date with routine eye exams with  Dr Herbert Deaner  Dietary issues and exercise activities discussed: Exercise limited by: None identified   Goals Addressed             This Visit's Progress    Stay Healthy         Depression Screen    06/25/2022    9:55 AM 06/24/2021    9:05 AM 06/06/2020   12:02 PM 05/28/2020    9:49 AM 05/26/2018    3:47 PM 05/25/2017    9:28 AM 05/20/2016    1:06 PM  PHQ 2/9 Scores  PHQ - 2 Score 0 0 0 0 0 0 0  PHQ- 9 Score   0 0 0 0     Fall Risk    06/25/2022    9:55 AM 06/24/2021    9:03 AM 06/06/2020   12:02 PM 05/28/2020    9:49 AM 05/26/2018    3:47 PM  Fall Risk   Falls in the past year? 1 0 0 0 0  Number falls in past yr: 0 0 0 0   Injury with Fall? 1 0 0 0   Comment Fx Rt shoulder followed by Orthopedic      Risk for fall due to :  No Fall Risks  Medication side effect   Follow up Falls prevention discussed Falls prevention discussed Falls evaluation completed Falls evaluation completed;Falls prevention discussed     FALL RISK PREVENTION PERTAINING TO THE HOME:  Any stairs in or around the home? Yes  If so, are there any without handrails? No  Home free of loose throw rugs in walkways, pet beds, electrical cords, etc? Yes  Adequate lighting in your home to reduce risk of falls? Yes   ASSISTIVE DEVICES UTILIZED TO PREVENT FALLS:  Life alert? No  Use of a cane, walker or w/c? No  Grab bars in the bathroom? No  Shower chair or bench in shower? Yes  Elevated toilet seat or a handicapped toilet? No   TIMED UP AND GO:  Was the test performed? No . Audio Visit  Cognitive Function:    05/28/2020    9:52 AM 05/26/2018    3:42 PM 05/25/2017    9:29 AM  MMSE - Mini Mental State Exam  Orientation to time '5 5 5  '$ Orientation to Place '5 5 5  '$ Registration '3 3 3  '$ Attention/ Calculation 5 0 0  Recall '3 3 3  '$ Language- name 2 objects  0 0   Language- repeat '1 1 1  '$ Language- follow 3 step command  3 3  Language- read & follow direction  0 0  Write a sentence  0 0  Copy design  0 0  Total score  20 20        06/25/2022    9:57 AM  6CIT Screen  What Year? 0 points  What month? 0 points  What time? 0 points  Count back from 20 0 points  Months in reverse 0 points  Repeat phrase 0 points  Total Score 0 points    Immunizations Immunization History  Administered Date(s) Administered   Fluad Quad(high Dose 65+) 05/07/2019   Influenza Split 04/26/2012   Influenza,inj,Quad PF,6+ Mos 05/08/2013, 05/27/2017, 05/29/2018, 06/25/2021   Janssen (J&J) SARS-COV-2 Vaccination 10/03/2019   Pneumococcal Conjugate-13 04/12/2014   Pneumococcal Polysaccharide-23 04/20/2011      Flu Vaccine status: Up to date  Pneumococcal vaccine status: Up to date  Covid-19 vaccine status: Completed vaccines  Qualifies for Shingles Vaccine? Yes   Zostavax completed No   Shingrix Completed?: No.    Education has been provided regarding the importance of this vaccine. Patient has been advised to call insurance company to determine out of pocket expense if they have not yet received this vaccine. Advised may also receive vaccine at local pharmacy or Health Dept. Verbalized acceptance and understanding.  Screening Tests Health Maintenance  Topic Date Due   DTaP/Tdap/Td (1 - Tdap) Never done   COVID-19 Vaccine (2 - 2023-24 season) 07/11/2022 (Originally 03/26/2022)   Zoster Vaccines- Shingrix (1 of 2) 09/24/2022 (Originally 04/26/1992)   INFLUENZA VACCINE  10/24/2022 (Originally 02/23/2022)   Medicare Annual Wellness (AWV)  06/26/2023   Pneumonia Vaccine 58+ Years old  Completed   DEXA SCAN  Completed   HPV VACCINES  Aged Out   MAMMOGRAM  Discontinued   COLONOSCOPY (Pts 45-71yr Insurance coverage will need to be confirmed)  Discontinued    Health Maintenance  Health Maintenance Due  Topic Date Due   DTaP/Tdap/Td (1 - Tdap) Never done     Colorectal cancer screening: No longer required.   Mammogram status: No longer required due to Age.  Bone Density status: Completed 10/25/16. Results reflect: Bone density results: OSTEOPOROSIS. Repeat every   years.  Lung Cancer Screening: (Low Dose CT Chest recommended if Age 80-80years, 30 pack-year currently smoking OR have quit w/in 15years.) does not qualify.     Additional Screening:  Hepatitis C Screening: does not qualify; Completed   Vision Screening: Recommended annual ophthalmology exams for early detection of glaucoma and other disorders of the eye. Is the patient up to date with their annual eye exam?  Yes  Who is the provider or what is the name of the office in which the patient attends annual eye exams? Dr HHerbert DeanerIf pt is not established with a provider, would they like to be referred to a provider to establish care? No .   Dental Screening: Recommended annual dental exams for proper oral hygiene  Community Resource Referral / Chronic Care Management:  CRR required this visit?  No   CCM required this visit?  No      Plan:     I have personally reviewed and noted the following in the patient's chart:   Medical and social history Use of alcohol, tobacco or illicit drugs  Current medications and supplements including opioid prescriptions. Patient is not currently taking opioid prescriptions. Functional ability and status Nutritional status Physical activity Advanced directives List of other physicians Hospitalizations, surgeries, and ER visits in previous 12 months Vitals Screenings to include cognitive, depression, and falls Referrals and appointments  In addition, I have reviewed and discussed with patient certain preventive protocols, quality metrics, and best practice recommendations. A written personalized care plan for preventive services as well as general preventive health recommendations were provided to patient.     Criselda Peaches,  LPN   [id number]   Nurse Notes: Patient due Dtap/Tdap/Td(1-Tdap)

## 2022-06-28 ENCOUNTER — Encounter: Payer: Self-pay | Admitting: Family Medicine

## 2022-06-28 ENCOUNTER — Ambulatory Visit (INDEPENDENT_AMBULATORY_CARE_PROVIDER_SITE_OTHER): Payer: PPO | Admitting: Family Medicine

## 2022-06-28 VITALS — BP 124/82 | HR 77 | Temp 97.8°F | Ht 64.0 in | Wt 164.0 lb

## 2022-06-28 DIAGNOSIS — I10 Essential (primary) hypertension: Secondary | ICD-10-CM

## 2022-06-28 DIAGNOSIS — Z23 Encounter for immunization: Secondary | ICD-10-CM | POA: Diagnosis not present

## 2022-06-28 DIAGNOSIS — Z780 Asymptomatic menopausal state: Secondary | ICD-10-CM

## 2022-06-28 DIAGNOSIS — M2550 Pain in unspecified joint: Secondary | ICD-10-CM

## 2022-06-28 DIAGNOSIS — Z7189 Other specified counseling: Secondary | ICD-10-CM

## 2022-06-28 DIAGNOSIS — Z0000 Encounter for general adult medical examination without abnormal findings: Secondary | ICD-10-CM

## 2022-06-28 MED ORDER — TRIAMTERENE-HCTZ 37.5-25 MG PO TABS: 1.0000 | ORAL_TABLET | Freq: Every day | ORAL | 3 refills | Status: DC

## 2022-06-28 MED ORDER — ETODOLAC 400 MG PO TABS: ORAL_TABLET | ORAL | 3 refills | Status: DC

## 2022-06-28 MED ORDER — VITAMIN B-12 1000 MCG PO TABS: 1000.0000 ug | ORAL_TABLET | ORAL | Status: DC | PRN

## 2022-06-28 MED ORDER — AMLODIPINE BESYLATE 5 MG PO TABS
5.0000 mg | ORAL_TABLET | Freq: Every day | ORAL | 3 refills | Status: DC
Start: 2022-06-28 — End: 2023-05-04

## 2022-06-28 NOTE — Patient Instructions (Addendum)
You can call for a bone density test at [facility] [phone]  If you have trouble getting scheduled then let me know.    Let me know if you want to go to PT to try to decrease the risk of falling.    Tetanus shot may be cheaper at the pharmacy.    Take care.  Glad to see you.

## 2022-06-28 NOTE — Progress Notes (Unsigned)
Hypertension:    Using medication without problems or lightheadedness: yes Chest pain with exertion:no Edema:no Short of breath:no Labs d/w pt.    Fall cautions d/w pt.  She changed shoes in the meantime.  We talked about potentially going to PT for fall risk reduction.  She will consider.  Taking lodine daily in the AM with food.  It helps with joint pain, neck pain.  No ADE on med.  She failed taper.    She is taking B12 only when fatigued, ie prn.  B12 level normal.  Advance directive-husband designated if patient were incapacitated.  Flu 2023 Shingles prev done at pharmacy.   PNA up-to-date Tetanus discussed with patient Covid prev done.   Colon cancer screening declined by patient. Breast cancer screening declined by patient. DXA ordered 2023.    Meds, vitals, and allergies reviewed.   PMH and SH reviewed  ROS: Per HPI unless specifically indicated in ROS section   GEN: nad, alert and oriented HEENT: ncat NECK: supple w/o LA CV: rrr. PULM: ctab, no inc wob ABD: soft, +bs EXT: no edema SKIN: no acute rash  30 minutes were devoted to patient care in this encounter (this includes time spent reviewing the patient's file/history, interviewing and examining the patient, counseling/reviewing plan with patient).

## 2022-06-30 NOTE — Assessment & Plan Note (Signed)
Advance directive- husband designated if patient were incapacitated.  

## 2022-06-30 NOTE — Assessment & Plan Note (Signed)
Continue as needed loading with food.

## 2022-06-30 NOTE — Assessment & Plan Note (Addendum)
Advance directive-husband designated if patient were incapacitated.  Flu 2023 Shingles prev done at pharmacy.   PNA up-to-date Tetanus discussed with patient Covid prev done.   Colon cancer screening declined by patient. Breast cancer screening declined by patient. DXA ordered 2023.

## 2022-06-30 NOTE — Assessment & Plan Note (Signed)
Continue amlodipine and triamterene hydrochlorothiazide.  Recent labs discussed with patient.

## 2022-07-01 ENCOUNTER — Telehealth: Payer: Self-pay | Admitting: Family Medicine

## 2022-07-01 DIAGNOSIS — R269 Unspecified abnormalities of gait and mobility: Secondary | ICD-10-CM

## 2022-07-01 DIAGNOSIS — Z9181 History of falling: Secondary | ICD-10-CM

## 2022-07-01 NOTE — Telephone Encounter (Signed)
Patient called and stated she has the number where she would like her physical therapy called in at [phone], [facility].

## 2022-07-02 ENCOUNTER — Telehealth: Payer: Self-pay | Admitting: Family Medicine

## 2022-07-02 DIAGNOSIS — E2839 Other primary ovarian failure: Secondary | ICD-10-CM

## 2022-07-02 NOTE — Telephone Encounter (Signed)
Done. Thanks.

## 2022-07-02 NOTE — Telephone Encounter (Signed)
Seth Bake from the 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 Call back number [phone]. Ext [phone]

## 2022-07-02 NOTE — Telephone Encounter (Signed)
Patient notified referral was done.  

## 2022-07-02 NOTE — Addendum Note (Signed)
Addended by: Tonia Ghent on: 07/02/2022 07:56 AM   Modules accepted: Orders

## 2022-07-02 NOTE — Telephone Encounter (Signed)
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 Patient is past the age of postmenopausal and dx can not be used anymore.

## 2022-07-04 NOTE — Telephone Encounter (Signed)
I change the order.  She is still postmenopausal.

## 2022-07-04 NOTE — Addendum Note (Signed)
Addended by: Tonia Ghent on: 07/04/2022 04:03 PM   Modules accepted: Orders

## 2022-07-05 NOTE — Telephone Encounter (Signed)
Called and notified breast center that order has been updated.

## 2022-07-07 DIAGNOSIS — R2689 Other abnormalities of gait and mobility: Secondary | ICD-10-CM | POA: Diagnosis not present

## 2022-07-07 DIAGNOSIS — Z9181 History of falling: Secondary | ICD-10-CM | POA: Diagnosis not present

## 2022-07-12 DIAGNOSIS — Z9181 History of falling: Secondary | ICD-10-CM | POA: Diagnosis not present

## 2022-07-12 DIAGNOSIS — R2689 Other abnormalities of gait and mobility: Secondary | ICD-10-CM | POA: Diagnosis not present

## 2022-07-29 DIAGNOSIS — Z9181 History of falling: Secondary | ICD-10-CM | POA: Diagnosis not present

## 2022-07-29 DIAGNOSIS — R2689 Other abnormalities of gait and mobility: Secondary | ICD-10-CM | POA: Diagnosis not present

## 2022-08-05 DIAGNOSIS — Z9181 History of falling: Secondary | ICD-10-CM | POA: Diagnosis not present

## 2022-08-05 DIAGNOSIS — R2689 Other abnormalities of gait and mobility: Secondary | ICD-10-CM | POA: Diagnosis not present

## 2022-08-11 DIAGNOSIS — R2689 Other abnormalities of gait and mobility: Secondary | ICD-10-CM | POA: Diagnosis not present

## 2022-08-11 DIAGNOSIS — Z9181 History of falling: Secondary | ICD-10-CM | POA: Diagnosis not present

## 2022-08-18 DIAGNOSIS — Z9181 History of falling: Secondary | ICD-10-CM | POA: Diagnosis not present

## 2022-08-18 DIAGNOSIS — R2689 Other abnormalities of gait and mobility: Secondary | ICD-10-CM | POA: Diagnosis not present

## 2022-08-25 DIAGNOSIS — Z9181 History of falling: Secondary | ICD-10-CM | POA: Diagnosis not present

## 2022-08-25 DIAGNOSIS — R2689 Other abnormalities of gait and mobility: Secondary | ICD-10-CM | POA: Diagnosis not present

## 2023-01-18 DIAGNOSIS — H402221 Chronic angle-closure glaucoma, left eye, mild stage: Secondary | ICD-10-CM | POA: Diagnosis not present

## 2023-01-18 DIAGNOSIS — H35033 Hypertensive retinopathy, bilateral: Secondary | ICD-10-CM | POA: Diagnosis not present

## 2023-01-18 DIAGNOSIS — H353131 Nonexudative age-related macular degeneration, bilateral, early dry stage: Secondary | ICD-10-CM | POA: Diagnosis not present

## 2023-01-18 DIAGNOSIS — H402213 Chronic angle-closure glaucoma, right eye, severe stage: Secondary | ICD-10-CM | POA: Diagnosis not present

## 2023-02-15 ENCOUNTER — Telehealth: Payer: Self-pay

## 2023-02-15 NOTE — Telephone Encounter (Signed)
No answer for patient to schedule follow up appointment with office for chronic care.  Amy Barron, [facility]

## 2023-02-24 DIAGNOSIS — D485 Neoplasm of uncertain behavior of skin: Secondary | ICD-10-CM | POA: Diagnosis not present

## 2023-02-24 DIAGNOSIS — C44722 Squamous cell carcinoma of skin of right lower limb, including hip: Secondary | ICD-10-CM | POA: Diagnosis not present

## 2023-04-13 DIAGNOSIS — I872 Venous insufficiency (chronic) (peripheral): Secondary | ICD-10-CM | POA: Diagnosis not present

## 2023-04-13 DIAGNOSIS — C44722 Squamous cell carcinoma of skin of right lower limb, including hip: Secondary | ICD-10-CM | POA: Diagnosis not present

## 2023-04-27 DIAGNOSIS — C44629 Squamous cell carcinoma of skin of left upper limb, including shoulder: Secondary | ICD-10-CM | POA: Diagnosis not present

## 2023-04-27 DIAGNOSIS — Z48817 Encounter for surgical aftercare following surgery on the skin and subcutaneous tissue: Secondary | ICD-10-CM | POA: Diagnosis not present

## 2023-04-27 DIAGNOSIS — D485 Neoplasm of uncertain behavior of skin: Secondary | ICD-10-CM | POA: Diagnosis not present

## 2023-04-27 DIAGNOSIS — Z4801 Encounter for change or removal of surgical wound dressing: Secondary | ICD-10-CM | POA: Diagnosis not present

## 2023-05-03 ENCOUNTER — Other Ambulatory Visit: Payer: Self-pay | Admitting: Family Medicine

## 2023-05-09 DIAGNOSIS — C44629 Squamous cell carcinoma of skin of left upper limb, including shoulder: Secondary | ICD-10-CM | POA: Diagnosis not present

## 2023-06-21 ENCOUNTER — Other Ambulatory Visit: Payer: PPO

## 2023-06-22 ENCOUNTER — Other Ambulatory Visit (INDEPENDENT_AMBULATORY_CARE_PROVIDER_SITE_OTHER): Payer: PPO

## 2023-06-22 ENCOUNTER — Other Ambulatory Visit: Payer: Self-pay | Admitting: Family Medicine

## 2023-06-22 DIAGNOSIS — I10 Essential (primary) hypertension: Secondary | ICD-10-CM | POA: Diagnosis not present

## 2023-06-22 DIAGNOSIS — R5383 Other fatigue: Secondary | ICD-10-CM

## 2023-06-22 LAB — CBC WITH DIFFERENTIAL/PLATELET
Basophils Absolute: 0.1 10*3/uL (ref 0.0–0.1)
Basophils Relative: 1.3 % (ref 0.0–3.0)
Eosinophils Absolute: 0.2 10*3/uL (ref 0.0–0.7)
Eosinophils Relative: 2.7 % (ref 0.0–5.0)
HCT: 42.4 % (ref 36.0–46.0)
Hemoglobin: 14.2 g/dL (ref 12.0–15.0)
Lymphocytes Relative: 22.5 % (ref 12.0–46.0)
Lymphs Abs: 1.5 10*3/uL (ref 0.7–4.0)
MCHC: 33.5 g/dL (ref 30.0–36.0)
MCV: 95.8 fL (ref 78.0–100.0)
Monocytes Absolute: 0.5 10*3/uL (ref 0.1–1.0)
Monocytes Relative: 7.6 % (ref 3.0–12.0)
Neutro Abs: 4.5 10*3/uL (ref 1.4–7.7)
Neutrophils Relative %: 65.9 % (ref 43.0–77.0)
Platelets: 288 10*3/uL (ref 150.0–400.0)
RBC: 4.42 Mil/uL (ref 3.87–5.11)
RDW: 13.3 % (ref 11.5–15.5)
WBC: 6.8 10*3/uL (ref 4.0–10.5)

## 2023-06-22 LAB — COMPREHENSIVE METABOLIC PANEL
ALT: 6 U/L (ref 0–35)
AST: 13 U/L (ref 0–37)
Albumin: 4.4 g/dL (ref 3.5–5.2)
Alkaline Phosphatase: 66 U/L (ref 39–117)
BUN: 27 mg/dL — ABNORMAL HIGH (ref 6–23)
CO2: 30 meq/L (ref 19–32)
Calcium: 9.6 mg/dL (ref 8.4–10.5)
Chloride: 104 meq/L (ref 96–112)
Creatinine, Ser: 1.15 mg/dL (ref 0.40–1.20)
GFR: 44.79 mL/min — ABNORMAL LOW (ref >60.00)
Glucose, Bld: 110 mg/dL — ABNORMAL HIGH (ref 70–99)
Potassium: 4 meq/L (ref 3.5–5.1)
Sodium: 143 meq/L (ref 135–145)
Total Bilirubin: 0.7 mg/dL (ref 0.2–1.2)
Total Protein: 6.6 g/dL (ref 6.0–8.3)

## 2023-06-22 LAB — LIPID PANEL
Cholesterol: 229 mg/dL — ABNORMAL HIGH (ref 0–200)
HDL: 47.7 mg/dL (ref >39.00)
LDL Cholesterol: 153 mg/dL — ABNORMAL HIGH (ref 0–99)
NonHDL: 181.1
Total CHOL/HDL Ratio: 5
Triglycerides: 142 mg/dL (ref 0.0–149.0)
VLDL: 28.4 mg/dL (ref 0.0–40.0)

## 2023-06-22 LAB — VITAMIN B12: Vitamin B-12: 273 pg/mL (ref 211–911)

## 2023-06-22 LAB — VITAMIN D 25 HYDROXY (VIT D DEFICIENCY, FRACTURES): VITD: 27.84 ng/mL — ABNORMAL LOW (ref 30.00–100.00)

## 2023-06-30 ENCOUNTER — Encounter: Payer: Self-pay | Admitting: Family Medicine

## 2023-06-30 ENCOUNTER — Ambulatory Visit: Payer: PPO | Admitting: Family Medicine

## 2023-06-30 VITALS — BP 138/72 | HR 75 | Temp 98.1°F | Ht 62.6 in | Wt 164.2 lb

## 2023-06-30 DIAGNOSIS — I10 Essential (primary) hypertension: Secondary | ICD-10-CM | POA: Diagnosis not present

## 2023-06-30 DIAGNOSIS — Z0000 Encounter for general adult medical examination without abnormal findings: Secondary | ICD-10-CM

## 2023-06-30 DIAGNOSIS — M2550 Pain in unspecified joint: Secondary | ICD-10-CM | POA: Diagnosis not present

## 2023-06-30 DIAGNOSIS — Z23 Encounter for immunization: Secondary | ICD-10-CM | POA: Diagnosis not present

## 2023-06-30 DIAGNOSIS — Z7189 Other specified counseling: Secondary | ICD-10-CM | POA: Diagnosis not present

## 2023-06-30 MED ORDER — AMLODIPINE BESYLATE 5 MG PO TABS: 5.0000 mg | ORAL_TABLET | Freq: Every day | ORAL | 3 refills | Status: DC

## 2023-06-30 MED ORDER — TRIAMTERENE-HCTZ 37.5-25 MG PO TABS: 1.0000 | ORAL_TABLET | Freq: Every day | ORAL | 3 refills | Status: DC

## 2023-06-30 MED ORDER — ETODOLAC 400 MG PO TABS: ORAL_TABLET | ORAL | 3 refills | Status: DC

## 2023-06-30 MED ORDER — VITAMIN D3 50 MCG (2000 UT) PO CAPS: 2000.0000 [IU] | ORAL_CAPSULE | Freq: Every day | ORAL | Status: AC

## 2023-06-30 NOTE — Patient Instructions (Addendum)
You can call for a bone density at [facility] [address] [employer] [phone]  Low dose flu shot today.  Take care.  Glad to see you. Try OTC compression stockings if you are going on a long bus ride.

## 2023-06-30 NOTE — Progress Notes (Signed)
Advance directive-husband designated if patient were incapacitated.  She had [facility]anus 20[facility].  Flu 2024, she asked about getting the lower dose vaccine.  Done at office visit.  Rationale for vaccination discussed with patient. Shingles prev done at pharmacy.   PNA up-to-date Tetanus discussed with patient Covid prev done.   RSV d/w pt.   Colon cancer screening declined by patient. Breast cancer screening declined by patient. DXA ordered 2024.  Hypertension:    Using medication without problems or lightheadedness: yes Chest pain with exertion:no Edema: occ noted with prolonged bus ride, is not an issue otherwise.   Short of breath:no Labs d/w pt.    Joint pain. Taking lodine at baseline.  It helps.  No ADE on med.    Meds, vitals, and allergies reviewed.   ROS: Per HPI unless specifically indicated in ROS section   GEN: nad, alert and oriented HEENT: mucous membranes moist NECK: supple w/o LA CV: rrr.  PULM: ctab, no inc wob ABD: soft, +bs EXT: no edema SKIN: Well-perfused.  34 minutes were devoted to patient care in this encounter (this includes time spent reviewing the patient's file/history, interviewing and examining the patient, counseling/reviewing plan with patient).

## 2023-07-03 NOTE — Assessment & Plan Note (Signed)
Continue amlodipine and triamterene hydrochlorothiazide.  Discussed using compression stockings if needed on prolonged bus trips.

## 2023-07-03 NOTE — Assessment & Plan Note (Signed)
Advance directive-husband designated if patient were incapacitated.  She [facility].  Flu 2024, she asked about getting the lower dose vaccine.  Done at office visit.  Rationale for vaccination discussed with patient. Shingles prev done at pharmacy.   PNA up-to-date Tetanus discussed with patient Covid prev done.   RSV d/w pt.   Colon cancer screening declined by patient. Breast cancer screening declined by patient. DXA ordered 2024.

## 2023-07-03 NOTE — Assessment & Plan Note (Signed)
Advance directive- husband designated if patient were incapacitated.  

## 2023-07-03 NOTE — Assessment & Plan Note (Signed)
Taking lodine at baseline.  It helps.  No ADE on med.   Continue as is.

## 2023-09-28 DIAGNOSIS — L820 Inflamed seborrheic keratosis: Secondary | ICD-10-CM | POA: Diagnosis not present

## 2023-09-28 DIAGNOSIS — Z789 Other specified health status: Secondary | ICD-10-CM | POA: Diagnosis not present

## 2023-09-28 DIAGNOSIS — R208 Other disturbances of skin sensation: Secondary | ICD-10-CM | POA: Diagnosis not present

## 2023-09-28 DIAGNOSIS — L538 Other specified erythematous conditions: Secondary | ICD-10-CM | POA: Diagnosis not present

## 2023-09-28 DIAGNOSIS — L2989 Other pruritus: Secondary | ICD-10-CM | POA: Diagnosis not present

## 2023-09-28 DIAGNOSIS — D485 Neoplasm of uncertain behavior of skin: Secondary | ICD-10-CM | POA: Diagnosis not present

## 2023-09-28 DIAGNOSIS — C44311 Basal cell carcinoma of skin of nose: Secondary | ICD-10-CM | POA: Diagnosis not present

## 2023-10-03 DIAGNOSIS — D485 Neoplasm of uncertain behavior of skin: Secondary | ICD-10-CM | POA: Diagnosis not present

## 2023-10-03 DIAGNOSIS — C44629 Squamous cell carcinoma of skin of left upper limb, including shoulder: Secondary | ICD-10-CM | POA: Diagnosis not present

## 2023-10-03 DIAGNOSIS — L820 Inflamed seborrheic keratosis: Secondary | ICD-10-CM | POA: Diagnosis not present

## 2023-10-25 ENCOUNTER — Telehealth: Payer: Self-pay

## 2023-10-25 ENCOUNTER — Other Ambulatory Visit: Payer: Self-pay | Admitting: Family Medicine

## 2023-10-25 DIAGNOSIS — L710 Perioral dermatitis: Secondary | ICD-10-CM | POA: Diagnosis not present

## 2023-10-25 MED ORDER — ETODOLAC 400 MG PO TABS: ORAL_TABLET | ORAL | 1 refills | Status: DC

## 2023-10-25 NOTE — Telephone Encounter (Signed)
 Copied from CRM [id number]. Topic: General - Other >> Oct 25, 2023  2:47 PM Aisha D wrote: Reason for CRM: Lokesh from [employer] is calling in regards to a prior authorization request for medication for the patient. Suzy Bouchard stated that he is sending the document by fax now and also provided his call back number [phone] select option 2.

## 2023-10-25 NOTE — Telephone Encounter (Signed)
 Copied from CRM [id number]. Topic: Clinical - Medication Refill >> Oct 25, 2023  2:04 PM Eunice Blase wrote: Most Recent Primary Care Visit:  Provider: Joaquim Nam  Department: [facility]  Visit Type: PHYSICAL  Date: 06/30/2023  Medication: etodolac (LODINE) 400 MG tablet  Has the patient contacted their pharmacy? Yes (Agent: If no, request that the patient contact the pharmacy for the refill. If patient does not wish to contact the pharmacy document the reason why and proceed with request.) (Agent: If yes, when and what did the pharmacy advise?)  Is this the correct pharmacy for this prescription? Yes If no, delete pharmacy and type the correct one.  This is the patient's preferred pharmacy:  [id number] [place], [place] - [address] [place] [address] Phone: [phone] Fax: [phone]   Has the prescription been filled recently? Yes  Is the patient out of the medication? Yes  Has the patient been seen for an appointment in the last year OR does the patient have an upcoming appointment? Yes  Can we respond through MyChart? Yes  Agent: Please be advised that Rx refills may take up to 3 business days. We ask that you follow-up with your pharmacy.

## 2023-10-26 NOTE — Telephone Encounter (Signed)
 As of this morning faxed not received. Once received will place in provider tray

## 2023-10-27 NOTE — Telephone Encounter (Signed)
 Received fax today placed in your tray

## 2023-10-27 NOTE — Telephone Encounter (Signed)
  Placed in your tray      Copied from CRM [id number]. Topic: General - Other >> Oct 25, 2023  2:47 PM Aisha D wrote: Reason for CRM: Lokesh from [employer] is calling in regards to a prior authorization request for medication for the patient. Suzy Bouchard stated that he is sending the document by fax now and also provided his call back number [phone] select option 2. >> Oct 27, 2023  1:16 PM Arm[place] J wrote: Patient is wondering if there was an update on medication authorization. She has six tablets of medication left.

## 2023-10-28 NOTE — Telephone Encounter (Signed)
 Please fax in.  I suspect it won't be covered and she may need to pay out of pocket.  Thanks.

## 2023-10-28 NOTE — Telephone Encounter (Signed)
 Please ask patient about questing #2 on the form and let me know.  Thanks.

## 2023-10-28 NOTE — Telephone Encounter (Signed)
 Fax sent.

## 2023-10-28 NOTE — Telephone Encounter (Signed)
 Spoke with patient and she says that she has never tried any of the mention medication that she has always taken the etofolac. She does not want to try anything else due to her age.   Form placed back in your tray

## 2023-12-12 ENCOUNTER — Ambulatory Visit: Payer: Self-pay

## 2023-12-12 ENCOUNTER — Encounter: Payer: Self-pay | Admitting: Family Medicine

## 2023-12-12 ENCOUNTER — Ambulatory Visit (INDEPENDENT_AMBULATORY_CARE_PROVIDER_SITE_OTHER)
Admission: RE | Admit: 2023-12-12 | Discharge: 2023-12-12 | Disposition: A | Discharge status: Home / Self Care | Source: Ambulatory Visit | Attending: Family Medicine | Admitting: Family Medicine

## 2023-12-12 ENCOUNTER — Ambulatory Visit: Admitting: Family Medicine

## 2023-12-12 VITALS — BP 144/86 | HR 68 | Temp 98.6°F | Ht 62.5 in | Wt 164.5 lb

## 2023-12-12 DIAGNOSIS — J209 Acute bronchitis, unspecified: Secondary | ICD-10-CM | POA: Diagnosis not present

## 2023-12-12 DIAGNOSIS — J40 Bronchitis, not specified as acute or chronic: Secondary | ICD-10-CM | POA: Diagnosis not present

## 2023-12-12 DIAGNOSIS — J479 Bronchiectasis, uncomplicated: Secondary | ICD-10-CM | POA: Diagnosis not present

## 2023-12-12 DIAGNOSIS — R051 Acute cough: Secondary | ICD-10-CM | POA: Diagnosis not present

## 2023-12-12 LAB — POC COVID19 BINAXNOW: SARS Coronavirus 2 Ag: NEGATIVE

## 2023-12-12 MED ORDER — PREDNISONE 20 MG PO TABS: 20.0000 mg | ORAL_TABLET | Freq: Every day | ORAL | 0 refills | Status: DC

## 2023-12-12 MED ORDER — BENZONATATE 200 MG PO CAPS: 200.0000 mg | ORAL_CAPSULE | Freq: Three times a day (TID) | ORAL | 1 refills | Status: DC | PRN

## 2023-12-12 NOTE — Telephone Encounter (Signed)
 Copied from CRM [id number]. Topic: Clinical - Red Word Triage >> Dec 12, 2023  8:05 AM Emylou G wrote: [facility] that prompted transfer to Nurse Triage: chest rattling, bad cough, low grade fever, spitting up green mucus  Chief Complaint: cough, sore throat Symptoms: see above Frequency: since Thursday Pertinent Negatives: Patient denies runny nose, sob Disposition: [] ED /[] Urgent Care (no appt availability in office) / [x] Appointment(In office/virtual)/ []  [facility]/ [] Home Care/ [] Refused Recommended Disposition /[] [facility]/ []  Follow-up with PCP Additional Notes: apt made for today per protocol; care advice given, denies questions; instructed to go to ER if becomes worse.   Reason for Disposition  [1] MILD difficulty breathing (e.g., minimal/no SOB at rest, SOB with walking, pulse <100) AND [2] still present when not coughing  Answer Assessment - Initial Assessment Questions 1. ONSET: "When did the cough begin?"      Thursday 2. SEVERITY: "How bad is the cough today?"      Mild to severe 3. SPUTUM: "Describe the color of your sputum" (none, dry cough; clear, white, yellow, green)     Green/yellow 4. HEMOPTYSIS: "Are you coughing up any blood?" If so ask: "How much?" (flecks, streaks, tablespoons, etc.)     no 5. DIFFICULTY BREATHING: "Are you having difficulty breathing?" If Yes, ask: "How bad is it?" (e.g., mild, moderate, severe)    - MILD: No SOB at rest, mild SOB with walking, speaks normally in sentences, can lie down, no retractions, pulse < 100.    - MODERATE: SOB at rest, SOB with minimal exertion and prefers to sit, cannot lie down flat, speaks in phrases, mild retractions, audible wheezing, pulse 100-120.    - SEVERE: Very SOB at rest, speaks in single words, struggling to breathe, sitting hunched forward, retractions, pulse > 120      no 6. FEVER: "Do you have a fever?" If Yes, ask: "What is your temperature, how was it measured, and when did it  start?"     100.2 this am 7. CARDIAC HISTORY: "Do you have any history of heart disease?" (e.g., heart attack, congestive heart failure)      no 8. LUNG HISTORY: "Do you have any history of lung disease?"  (e.g., pulmonary embolus, asthma, emphysema)     no 9. PE RISK FACTORS: "Do you have a history of blood clots?" (or: recent major surgery, recent prolonged travel, bedridden)     no 10. OTHER SYMPTOMS: "Do you have any other symptoms?" (e.g., runny nose, wheezing, chest pain)       Sore throat 11. PREGNANCY: "Is there any chance you are pregnant?" "When was your last menstrual period?"       na 12. TRAVEL: "Have you traveled out of the country in the last month?" (e.g., travel history, exposures)       no  Protocols used: Cough - Acute Productive-A-AH

## 2023-12-12 NOTE — Telephone Encounter (Signed)
 Will see her then Agree with UC /ER precautions

## 2023-12-12 NOTE — Assessment & Plan Note (Addendum)
 Day 5 of prod cough (colored sputum) with some intermittent fever  Rhonchi and some wheeze on exam  Negative covid test   Cxr today notes no infiltrate but some evidence of bronchiectasis and course interstitial markings  Recommend follow up with pcp in 1-2 weeks for this/ re check   Disc symptomatic care - see instructions on AVS Prednisone  20 mg daily 5 d-discussed side effects  Tessalon  200 mg prn cough   Update if not starting to improve in a week or if worsening  Call back and Er precautions noted in detail today

## 2023-12-12 NOTE — Progress Notes (Signed)
 Subjective:    Patient ID: Amy Barron, female    DOB: Oct 22, 1940, 83 y.o.   MRN: [id number]  HPI  Wt Readings from Last 3 Encounters:  12/12/23 164 lb 8 oz (74.6 kg)  06/30/23 164 lb 3.2 oz (74.5 kg)  06/28/22 164 lb (74.4 kg)   29.61 kg/m  Vitals:   12/12/23 1448  BP: (!) 144/86  Pulse: 68  Temp: 98.6 F (37 C)  SpO2: 97%   83 yo pt of Dr Vallarie Gauze presents with uri symptoms for 5 d She has past history significant for HTN and GERD  Cough prod of green sputum No shortness of breath  Some rattle when she lies down  Some wheeze possible   Nasal congestion  Sore throat -worse in beginning   Temp -highest 100.2 -last night  This am 99.9   No sinus pain   No n/v A little diarrhea   Covid test negative  Results for orders placed or performed in visit on 12/12/23  POC COVID-19   Collection Time: 12/12/23  2:58 PM  Result Value Ref Range   SARS Coronavirus 2 Ag Negative Negative       Over the counter Mucinex DM  Chlorcedin  Fluids - husband says not enough / some tea   Cxr DG Chest 2 View Result Date: 12/13/2023 CLINICAL DATA:  83 year old female with bronchitis EXAM: CHEST - 2 VIEW COMPARISON:  07/28/2018 FINDINGS: Cardiomediastinal silhouette unchanged in size and contour. No evidence of central vascular congestion. No interlobular septal thickening. Bronchiectasis.  Mild architectural distortion. No pneumothorax or pleural effusion. Coarsened interstitial markings, with no confluent airspace disease. No acute displaced fracture. Degenerative changes of the spine. Surgical changes of bilateral shoulder region incompletely imaged. IMPRESSION: Negative for acute cardiopulmonary disease Electronically Signed   By: Myrlene Asper D.O.   On: 12/13/2023 08:31      Patient Active Problem List   Diagnosis Date Noted   Acute bronchitis 12/12/2023   Leg pain 06/28/2021   Other fatigue 06/08/2020   Facial rash 01/28/2020   Postmenopausal 08/24/2017    Healthcare maintenance 05/29/2017   Advance care planning 05/29/2017   Joint ache 05/29/2017   Medicare annual wellness visit, subsequent 05/20/2016   Well woman exam 04/15/2016   GERD (gastroesophageal reflux disease) 08/14/2015   Allergic rhinitis 04/08/2015   HLD (hyperlipidemia) 09/30/2010   Essential hypertension 09/30/2010   Past Medical History:  Diagnosis Date   BCC (basal cell carcinoma of skin)    Cataract 2018   corrected with surgery   History of renal stone    Hyperlipidemia    Hypertension    Past Surgical History:  Procedure Laterality Date   ABDOMINAL HYSTERECTOMY  1998   CATARACT EXTRACTION W/ INTRAOCULAR LENS  IMPLANT, BILATERAL  10/2016   COLONOSCOPY W/ BIOPSIES AND POLYPECTOMY     DILATION AND CURETTAGE OF UTERUS     EYELID CARCINOMA EXCISION Right    June 2022   MULTIPLE TOOTH EXTRACTIONS     ORIF HUMERUS FRACTURE Left 07/11/2015   Procedure: OPEN REDUCTION INTERNAL FIXATION (ORIF) PROXIMAL HUMERUS FRACTURE;  Surgeon: Adah Acron, MD;  Location: [facility] OR;  Service: Orthopedics;  Laterality: Left;   ORIF WRIST FRACTURE  01/18/2012   Procedure: OPEN REDUCTION INTERNAL FIXATION (ORIF) WRIST FRACTURE;  Surgeon: Adah Acron, MD;  Location: [facility] OR;  Service: Orthopedics;  Laterality: Left;   PARATHYROIDECTOMY     REVERSE SHOULDER ARTHROPLASTY Right 07/05/2021   Procedure: REVERSE SHOULDER ARTHROPLASTY;  Surgeon:  Micheline Ahr, MD;  Location: [facility] OR;  Service: Orthopedics;  Laterality: Right;   SKIN CANCER EXCISION Right 07/01/2021   neck   TUBAL LIGATION     Social History   Tobacco Use   Smoking status: Never   Smokeless tobacco: Never  Vaping Use   Vaping status: Never Used  Substance Use Topics   Alcohol use: No   Drug use: No   Family History  Problem Relation Age of Onset   Cancer Father    Colon cancer Father 64   Diabetes Other    Breast cancer Maternal Aunt    Allergies  Allergen Reactions   Phenergan [Promethazine Hcl] Other (See  Comments)    Lack of effect, not an allergy   Zofran  [Ondansetron  Hcl]     Lack of effect, not an allergy   Current Outpatient Medications on File Prior to Visit  Medication Sig Dispense Refill   amLODipine  (NORVASC ) 5 MG tablet Take 1 tablet (5 mg total) by mouth daily. 90 tablet 3   Cholecalciferol (VITAMIN D3) 50 MCG (2000 UT) capsule Take 1 capsule (2,000 Units total) by mouth daily.     etodolac  (LODINE ) 400 MG tablet 1 a day with food 90 tablet 1   triamterene -hydrochlorothiazide  (MAXZIDE -25) 37.5-25 MG tablet Take 1 tablet by mouth daily. 90 tablet 3   No current facility-administered medications on file prior to visit.    Review of Systems  Constitutional:  Positive for appetite change and fatigue. Negative for fever.  HENT:  Positive for congestion, postnasal drip, rhinorrhea, sneezing and sore throat. Negative for ear pain and sinus pressure.   Eyes:  Negative for pain and discharge.  Respiratory:  Positive for cough and wheezing. Negative for shortness of breath and stridor.   Cardiovascular:  Negative for chest pain.  Gastrointestinal:  Negative for diarrhea, nausea and vomiting.  Genitourinary:  Negative for frequency, hematuria and urgency.  Musculoskeletal:  Negative for arthralgias and myalgias.  Skin:  Negative for rash.  Neurological:  Negative for dizziness, weakness, light-headedness and headaches.  Psychiatric/Behavioral:  Negative for confusion and dysphoric mood.        Objective:   Physical Exam Constitutional:      General: She is not in acute distress.    Appearance: Normal appearance. She is well-developed. She is obese. She is not ill-appearing, toxic-appearing or diaphoretic.  HENT:     Head: Normocephalic and atraumatic.     Comments: Nares are injected and congested      Right Ear: Tympanic membrane, ear canal and external ear normal.     Left Ear: Tympanic membrane, ear canal and external ear normal.     Nose: Congestion and rhinorrhea present.      Mouth/Throat:     Mouth: Mucous membranes are moist.     Pharynx: Oropharynx is clear. No oropharyngeal exudate or posterior oropharyngeal erythema.     Comments: Clear pnd  Eyes:     General:        Right eye: No discharge.        Left eye: No discharge.     Conjunctiva/sclera: Conjunctivae normal.     Pupils: Pupils are equal, round, and reactive to light.  Cardiovascular:     Rate and Rhythm: Normal rate.     Heart sounds: Normal heart sounds.  Pulmonary:     Effort: Pulmonary effort is normal. No respiratory distress.     Breath sounds: No stridor. Wheezing and rhonchi present. No rales.  Comments: Diffuse rhonchi Scattered wheeze on forced expiration  Upper airway sounds  Chest:     Chest wall: No tenderness.  Musculoskeletal:     Cervical back: Normal range of motion and neck supple.  Lymphadenopathy:     Cervical: No cervical adenopathy.  Skin:    General: Skin is warm and dry.     Capillary Refill: Capillary refill takes less than 2 seconds.     Findings: No rash.  Neurological:     Mental Status: She is alert.     Cranial Nerves: No cranial nerve deficit.  Psychiatric:        Mood and Affect: Mood normal.           Assessment & Plan:   Problem List Items Addressed This Visit       Respiratory   Acute bronchitis - Primary   Day 5 of prod cough (colored sputum) with some intermittent fever  Rhonchi and some wheeze on exam  Negative covid test   Cxr today notes no infiltrate but some evidence of bronchiectasis and course interstitial markings  Recommend follow up with pcp in 1-2 weeks for this/ re check   Disc symptomatic care - see instructions on AVS Prednisone  20 mg daily 5 d-discussed side effects  Tessalon  200 mg prn cough   Update if not starting to improve in a week or if worsening  Call back and Er precautions noted in detail today        Relevant Orders   DG Chest 2 View (Completed)   Other Visit Diagnoses       Acute cough        Relevant Orders   POC COVID-19 (Completed)

## 2023-12-12 NOTE — Patient Instructions (Signed)
 Stay hydrated Drink fluids and rest  mucinex DM is good for cough and congestion  Try the benzonatate  pearles for cough also  Take prednisone  for wheezing and tight chest (may make you hyper and hungry)  Nasal saline for congestion as needed  Tylenol  for fever or pain or headache  Please alert us  if symptoms worsen (if severe or short of breath please go to the ER)   Chest xray now  We will reach out when it is read and add medicine if needed

## 2023-12-13 ENCOUNTER — Ambulatory Visit

## 2023-12-13 ENCOUNTER — Ambulatory Visit: Payer: Self-pay | Admitting: Family Medicine

## 2023-12-13 ENCOUNTER — Encounter: Payer: Self-pay | Admitting: Family Medicine

## 2023-12-26 ENCOUNTER — Ambulatory Visit (INDEPENDENT_AMBULATORY_CARE_PROVIDER_SITE_OTHER): Admitting: Family Medicine

## 2023-12-26 ENCOUNTER — Ambulatory Visit
Admission: RE | Admit: 2023-12-26 | Discharge: 2023-12-26 | Disposition: A | Discharge status: Home / Self Care | Source: Ambulatory Visit | Attending: Family Medicine | Admitting: Family Medicine

## 2023-12-26 ENCOUNTER — Encounter: Payer: Self-pay | Admitting: Family Medicine

## 2023-12-26 VITALS — BP 124/68 | HR 70 | Temp 98.7°F | Ht 62.5 in | Wt 163.6 lb

## 2023-12-26 DIAGNOSIS — Z96611 Presence of right artificial shoulder joint: Secondary | ICD-10-CM | POA: Diagnosis not present

## 2023-12-26 DIAGNOSIS — R051 Acute cough: Secondary | ICD-10-CM

## 2023-12-26 DIAGNOSIS — R059 Cough, unspecified: Secondary | ICD-10-CM | POA: Diagnosis not present

## 2023-12-26 MED ORDER — BENZONATATE 200 MG PO CAPS: 200.0000 mg | ORAL_CAPSULE | Freq: Three times a day (TID) | ORAL | 1 refills | Status: DC | PRN

## 2023-12-26 NOTE — Progress Notes (Unsigned)
 F/u cough.  Prev CXR d/w pt.  Took tessalon  and prednisone .  She is better in the meantime.  No fevers recently.  No sputum.  Some lingering cough in the afternoon.  Noted when outside for about 1 hour.  Otherwise she feels well and does not have any acute symptoms.  Meds, vitals, and allergies reviewed.   ROS: Per HPI unless specifically indicated in ROS section   Nad Ncat Neck supple no LA Rrr Ctab Abd soft.  Nontender. Skin well-perfused. No wheeze or focal decrease in breath sounds.

## 2023-12-26 NOTE — Patient Instructions (Signed)
 Tessalon  for the cough if needed.  It should get better slowly.  Xray on the way out.  Take care.  Glad to see you. Update me as needed.

## 2023-12-28 ENCOUNTER — Telehealth: Payer: Self-pay | Admitting: Family Medicine

## 2023-12-28 NOTE — Assessment & Plan Note (Signed)
 Discussed options.  She could have a postinfectious cough.  Pathophysiology discussed patient. Tessalon  for the cough if needed.  It should get better slowly.  Update me as needed.  She agrees with plan.  See notes on follow-up chest x-ray.

## 2023-12-28 NOTE — Telephone Encounter (Signed)
 Please take patient.  I do not see any new changes on her chest x-ray.  I am awaiting her overread.  We will let her know when that is resulted.  If she is getting worse in the meantime then please let us  know.  I expect the cough to slowly improve.  Thanks.

## 2023-12-29 NOTE — Telephone Encounter (Signed)
 Patient notified

## 2024-01-01 ENCOUNTER — Ambulatory Visit: Payer: Self-pay | Admitting: Family Medicine

## 2024-01-18 DIAGNOSIS — H402213 Chronic angle-closure glaucoma, right eye, severe stage: Secondary | ICD-10-CM | POA: Diagnosis not present

## 2024-01-18 DIAGNOSIS — H35033 Hypertensive retinopathy, bilateral: Secondary | ICD-10-CM | POA: Diagnosis not present

## 2024-01-18 DIAGNOSIS — H524 Presbyopia: Secondary | ICD-10-CM | POA: Diagnosis not present

## 2024-01-18 DIAGNOSIS — H353131 Nonexudative age-related macular degeneration, bilateral, early dry stage: Secondary | ICD-10-CM | POA: Diagnosis not present

## 2024-01-18 DIAGNOSIS — H402221 Chronic angle-closure glaucoma, left eye, mild stage: Secondary | ICD-10-CM | POA: Diagnosis not present

## 2024-03-09 ENCOUNTER — Ambulatory Visit: Payer: Self-pay

## 2024-03-09 NOTE — Telephone Encounter (Signed)
 FYI Only or Action Required?: Action required by provider: request for appointment.  Patient was last seen in primary care on 12/26/2023 by Cleatus Arlyss RAMAN, MD.  Called Nurse Triage reporting Fever.  Symptoms began 2 days ago.  Interventions attempted: OTC medications: acetaminophen .  Symptoms are: unchanged.  Triage Disposition: See Physician Within 24 Hours  Patient/caregiver understands and will follow disposition?:   yes      Copied from CRM #[id number]. Topic: Clinical - Red Word Triage >> Mar 09, 2024  2:55 PM Jayma L wrote: Red Word that prompted transfer to Nurse Triage:   patient called in and stated she has a fever and she's really shakey, said its getting worse and she feels weak from not eating in the morning. Asking what she needs to do. Had her take her temp when she called in and she said its 99.6 . Reason for Disposition  Fever present > 3 days (72 hours)    2 days  Answer Assessment - Initial Assessment Questions 1. TEMPERATURE: What is the most recent temperature?  How was it measured?      99.6 2. ONSET: When did the fever start?      2 days  3. CHILLS: Do you have chills? If yes: How bad are they?  (e.g., none, mild, moderate, severe)     yes 4. OTHER SYMPTOMS: Do you have any other symptoms besides the fever?  (e.g., abdomen pain, cough, diarrhea, earache, headache, sore throat, urination pain)     No, weakness, poor appetite  5. CAUSE: If there are no symptoms, ask: What do you think is causing the fever?      no  7. TREATMENT: What have you done so far to treat this fever? (e.g., OTC fever medicines)     acetaminnophen 8. IMMUNOCOMPROMISE: Do you have any of the following: diabetes, HIV positive, splenectomy, cancer chemotherapy, chronic steroid treatment, transplant patient, etc.?     no  Protocols used: Fever-A-AH

## 2024-03-10 DIAGNOSIS — N390 Urinary tract infection, site not specified: Secondary | ICD-10-CM | POA: Diagnosis not present

## 2024-03-12 NOTE — Telephone Encounter (Signed)
 Copied from CRM [id number]. Topic: General - Call Back - No Documentation >> Mar 12, 2024 10:17 AM Charlet HERO wrote: Reason for CRM: Patient is calling to speak back with [place], she is stating that she missed call from her. The patient would like to have a call back [phone]

## 2024-03-12 NOTE — Telephone Encounter (Signed)
 Left message for patient to return call to office. Is she still having a fever? Did she go to urgent care?

## 2024-03-12 NOTE — Telephone Encounter (Signed)
 I have been out of the office unexpectedly and I plan to return to clinic Tuesday.  I am reponding to messages as quickily as I can in the menatime.    If she is improving then I would continue as is.  If not improving then let us  know.  Thanks.

## 2024-03-12 NOTE — Telephone Encounter (Signed)
 Returned call to patient she states that she went to urgent care on Saturday and was diagnosis with a UTI. They gave her ciprofloxacin 500 mg to take for 7 days. Her fever broke this morning. She is feeling a little better. Her main concern is her reading about the side effects of the ciprofloxacin but she will continue to take the medication

## 2024-04-03 ENCOUNTER — Telehealth: Payer: Self-pay

## 2024-04-03 DIAGNOSIS — M8580 Other specified disorders of bone density and structure, unspecified site: Secondary | ICD-10-CM

## 2024-04-03 DIAGNOSIS — I10 Essential (primary) hypertension: Secondary | ICD-10-CM

## 2024-04-03 NOTE — Telephone Encounter (Signed)
 Copied from CRM [id number]. Topic: Clinical - Request for Lab/Test Order >> Apr 03, 2024 10:54 AM Rea ORN wrote: Reason for CRM: Pt is requesting labs to be ordered prior to her CPE in December. She is also requested labs to be ordered to screen for cancer.

## 2024-04-04 NOTE — Telephone Encounter (Signed)
 Done. Thanks.

## 2024-04-04 NOTE — Addendum Note (Signed)
 Addended by: CLEATUS ARLYSS RAMAN on: 04/04/2024 03:48 PM   Modules accepted: Orders

## 2024-04-21 ENCOUNTER — Other Ambulatory Visit: Payer: Self-pay | Admitting: Family Medicine

## 2024-06-04 DIAGNOSIS — J209 Acute bronchitis, unspecified: Secondary | ICD-10-CM | POA: Diagnosis not present

## 2024-06-04 DIAGNOSIS — H9203 Otalgia, bilateral: Secondary | ICD-10-CM | POA: Diagnosis not present

## 2024-06-04 DIAGNOSIS — R059 Cough, unspecified: Secondary | ICD-10-CM | POA: Diagnosis not present

## 2024-06-27 ENCOUNTER — Other Ambulatory Visit (INDEPENDENT_AMBULATORY_CARE_PROVIDER_SITE_OTHER)

## 2024-06-27 ENCOUNTER — Ambulatory Visit: Payer: Self-pay | Admitting: Family Medicine

## 2024-06-27 DIAGNOSIS — I10 Essential (primary) hypertension: Secondary | ICD-10-CM | POA: Diagnosis not present

## 2024-06-27 DIAGNOSIS — M8580 Other specified disorders of bone density and structure, unspecified site: Secondary | ICD-10-CM

## 2024-06-27 LAB — COMPREHENSIVE METABOLIC PANEL WITH GFR
ALT: 8 U/L (ref 0–35)
AST: 16 U/L (ref 0–37)
Albumin: 4.3 g/dL (ref 3.5–5.2)
Alkaline Phosphatase: 69 U/L (ref 39–117)
BUN: 22 mg/dL (ref 6–23)
CO2: 30 meq/L (ref 19–32)
Calcium: 9.6 mg/dL (ref 8.4–10.5)
Chloride: 103 meq/L (ref 96–112)
Creatinine, Ser: 1.05 mg/dL (ref 0.40–1.20)
GFR: 49.6 mL/min — ABNORMAL LOW (ref >60.00)
Glucose, Bld: 106 mg/dL — ABNORMAL HIGH (ref 70–99)
Potassium: 3.9 meq/L (ref 3.5–5.1)
Sodium: 142 meq/L (ref 135–145)
Total Bilirubin: 0.9 mg/dL (ref 0.2–1.2)
Total Protein: 6.5 g/dL (ref 6.0–8.3)

## 2024-06-27 LAB — CBC WITH DIFFERENTIAL/PLATELET
Basophils Absolute: 0 K/uL (ref 0.0–0.1)
Basophils Relative: 0.4 % (ref 0.0–3.0)
Eosinophils Absolute: 0.1 K/uL (ref 0.0–0.7)
Eosinophils Relative: 2 % (ref 0.0–5.0)
HCT: 42.7 % (ref 36.0–46.0)
Hemoglobin: 14.2 g/dL (ref 12.0–15.0)
Lymphocytes Relative: 31.6 % (ref 12.0–46.0)
Lymphs Abs: 1.6 K/uL (ref 0.7–4.0)
MCHC: 33.4 g/dL (ref 30.0–36.0)
MCV: 93.5 fl (ref 78.0–100.0)
Monocytes Absolute: 0.3 K/uL (ref 0.1–1.0)
Monocytes Relative: 7 % (ref 3.0–12.0)
Neutro Abs: 2.9 K/uL (ref 1.4–7.7)
Neutrophils Relative %: 59 % (ref 43.0–77.0)
Platelets: 255 K/uL (ref 150.0–400.0)
RBC: 4.56 Mil/uL (ref 3.87–5.11)
RDW: 13.9 % (ref 11.5–15.5)
WBC: 5 K/uL (ref 4.0–10.5)

## 2024-06-27 LAB — LIPID PANEL
Cholesterol: 247 mg/dL — ABNORMAL HIGH (ref 0–200)
HDL: 56.1 mg/dL (ref >39.00)
LDL Cholesterol: 165 mg/dL — ABNORMAL HIGH (ref 0–99)
NonHDL: 191.08
Total CHOL/HDL Ratio: 4
Triglycerides: 132 mg/dL (ref 0.0–149.0)
VLDL: 26.4 mg/dL (ref 0.0–40.0)

## 2024-06-27 LAB — VITAMIN B12: Vitamin B-12: 282 pg/mL (ref 211–911)

## 2024-06-27 LAB — VITAMIN D 25 HYDROXY (VIT D DEFICIENCY, FRACTURES): VITD: 29.31 ng/mL — ABNORMAL LOW (ref 30.00–100.00)

## 2024-07-02 ENCOUNTER — Encounter: Admitting: Family Medicine

## 2024-07-06 ENCOUNTER — Encounter: Payer: Self-pay | Admitting: Family Medicine

## 2024-07-06 ENCOUNTER — Ambulatory Visit: Admitting: Family Medicine

## 2024-07-06 VITALS — BP 122/68 | HR 59 | Temp 97.9°F | Ht 63.5 in | Wt 163.5 lb

## 2024-07-06 DIAGNOSIS — I10 Essential (primary) hypertension: Secondary | ICD-10-CM | POA: Diagnosis not present

## 2024-07-06 DIAGNOSIS — E559 Vitamin D deficiency, unspecified: Secondary | ICD-10-CM | POA: Diagnosis not present

## 2024-07-06 DIAGNOSIS — M2550 Pain in unspecified joint: Secondary | ICD-10-CM

## 2024-07-06 DIAGNOSIS — Z0000 Encounter for general adult medical examination without abnormal findings: Secondary | ICD-10-CM | POA: Diagnosis not present

## 2024-07-06 DIAGNOSIS — Z7189 Other specified counseling: Secondary | ICD-10-CM

## 2024-07-06 MED ORDER — AMLODIPINE BESYLATE 5 MG PO TABS: 5.0000 mg | ORAL_TABLET | Freq: Every day | ORAL | 3 refills | Status: AC

## 2024-07-06 MED ORDER — ETODOLAC 400 MG PO TABS: ORAL_TABLET | ORAL | 3 refills | Status: AC

## 2024-07-06 MED ORDER — TRIAMTERENE-HCTZ 37.5-25 MG PO TABS: 1.0000 | ORAL_TABLET | Freq: Every day | ORAL | 3 refills | Status: AC

## 2024-07-06 NOTE — Patient Instructions (Signed)
 Don't change your meds for now.  Keep taking vitamin D .   Update me as needed.  Take care.  Glad to see you. I would get a flu shot each fall.

## 2024-07-06 NOTE — Progress Notes (Signed)
 Advance directive-husband designated if patient were incapacitated.  She had tetanus 2023 at [facility] in [facility].  Flu to be done at pharmacy Shingles prev done at pharmacy.   PNA up-to-date Covid prev done.   RSV d/w pt.   Colon cancer screening declined by patient. Breast cancer screening declined by patient. DXA declined 2024.  Hypertension:               Using medication without problems or lightheadedness: yes Chest pain with exertion:no Edema: occ noted with prolonged dependent positioning, is not an issue otherwise.   Short of breath:no Labs d/w pt.     Joint pain. Taking lodine  at baseline.  It helps.  No ADE on med.   She has clearly worse pain off med.  Cr stable.    Calcium wnl.  Vit D minimally low, she is taking vit D.  D/w pt.     Meds, vitals, and allergies reviewed.    ROS: Per HPI unless specifically indicated in ROS section    GEN: nad, alert and oriented HEENT: mucous membranes moist NECK: supple w/o LA CV: rrr.  PULM: ctab, no inc wob ABD: soft, +bs EXT: no edema SKIN: Well-perfused.

## 2024-07-08 DIAGNOSIS — E559 Vitamin D deficiency, unspecified: Secondary | ICD-10-CM | POA: Insufficient documentation

## 2024-07-08 NOTE — Assessment & Plan Note (Signed)
 Continue amlodipine  and maxzide . Labs d/w pt.

## 2024-07-08 NOTE — Assessment & Plan Note (Signed)
Advance directive- husband designated if patient were incapacitated.  

## 2024-07-08 NOTE — Assessment & Plan Note (Signed)
 Vit D minimally low, she is taking vit D.  D/w pt.   Should correct with current replacement.

## 2024-07-08 NOTE — Assessment & Plan Note (Signed)
 Taking lodine  at baseline.  It helps.  No ADE on med.   She has clearly worse pain off med.  Cr stable.

## 2024-07-08 NOTE — Assessment & Plan Note (Signed)
 Advance directive-husband designated if patient were incapacitated.  She had tetanus 2023 at [facility] in [facility].  Flu to be done at pharmacy Shingles prev done at pharmacy.   PNA up-to-date Covid prev done.   RSV d/w pt.   Colon cancer screening declined by patient. Breast cancer screening declined by patient. DXA declined 2024.
